# Patient Record
Sex: Female | Born: 1940
Health system: Southern US, Community
[De-identification: ages and names within clinical notes are randomized; demographics above are authoritative.]

## PROBLEM LIST (undated history)

## (undated) DIAGNOSIS — C539 Malignant neoplasm of cervix uteri, unspecified: Secondary | ICD-10-CM

## (undated) DIAGNOSIS — F0390 Unspecified dementia without behavioral disturbance: Secondary | ICD-10-CM

## (undated) DIAGNOSIS — R001 Bradycardia, unspecified: Secondary | ICD-10-CM

## (undated) DIAGNOSIS — I10 Essential (primary) hypertension: Secondary | ICD-10-CM

## (undated) HISTORY — DX: Malignant neoplasm of cervix uteri, unspecified: C53.9

## (undated) HISTORY — DX: Bradycardia, unspecified: R00.1

## (undated) HISTORY — DX: Unspecified dementia, unspecified severity, without behavioral disturbance, psychotic disturbance, mood disturbance, and anxiety: F03.90

## (undated) HISTORY — DX: Essential (primary) hypertension: I10

---

## 2011-03-12 DIAGNOSIS — I10 Essential (primary) hypertension: Secondary | ICD-10-CM

## 2011-03-12 HISTORY — DX: Essential (primary) hypertension: I10

## 2012-03-11 DIAGNOSIS — R001 Bradycardia, unspecified: Secondary | ICD-10-CM

## 2012-03-11 DIAGNOSIS — C539 Malignant neoplasm of cervix uteri, unspecified: Secondary | ICD-10-CM

## 2012-03-11 HISTORY — DX: Bradycardia, unspecified: R00.1

## 2012-03-11 HISTORY — DX: Malignant neoplasm of cervix uteri, unspecified: C53.9

## 2012-05-09 HISTORY — PX: ABDOMINAL HYSTERECTOMY: SHX81

## 2014-01-14 ENCOUNTER — Ambulatory Visit: Payer: Self-pay | Attending: Family Medicine

## 2014-01-28 ENCOUNTER — Encounter: Payer: Self-pay | Admitting: Family Medicine

## 2014-01-28 ENCOUNTER — Ambulatory Visit: Payer: Self-pay | Attending: Family Medicine | Admitting: Family Medicine

## 2014-01-28 VITALS — BP 143/86 | HR 63 | Temp 97.9°F | Resp 16 | Ht 62.0 in | Wt 143.0 lb

## 2014-01-28 DIAGNOSIS — H6092 Unspecified otitis externa, left ear: Secondary | ICD-10-CM | POA: Insufficient documentation

## 2014-01-28 DIAGNOSIS — R413 Other amnesia: Secondary | ICD-10-CM | POA: Insufficient documentation

## 2014-01-28 DIAGNOSIS — R220 Localized swelling, mass and lump, head: Secondary | ICD-10-CM | POA: Insufficient documentation

## 2014-01-28 DIAGNOSIS — E7800 Pure hypercholesterolemia, unspecified: Secondary | ICD-10-CM

## 2014-01-28 DIAGNOSIS — Z90710 Acquired absence of both cervix and uterus: Secondary | ICD-10-CM | POA: Insufficient documentation

## 2014-01-28 DIAGNOSIS — Z8541 Personal history of malignant neoplasm of cervix uteri: Secondary | ICD-10-CM | POA: Insufficient documentation

## 2014-01-28 DIAGNOSIS — R6889 Other general symptoms and signs: Secondary | ICD-10-CM

## 2014-01-28 DIAGNOSIS — I10 Essential (primary) hypertension: Secondary | ICD-10-CM | POA: Insufficient documentation

## 2014-01-28 DIAGNOSIS — F0390 Unspecified dementia without behavioral disturbance: Secondary | ICD-10-CM | POA: Insufficient documentation

## 2014-01-28 MED ORDER — TOBRAMYCIN 0.3 % OP SOLN: OPHTHALMIC | Status: DC

## 2014-01-28 NOTE — Progress Notes (Signed)
   Subjective:    Patient ID: Kara Dennis, female    DOB: 10/24/1940, 73 y.o.   MRN: [id number] CC: establish care, hx of cervical cancer, forgetfulness  HPI 73 yo F from [place] presents with her daughter and [place] interpreter to discuss the following:  1. Cervical cancer: dx in 2014. S/p total hysterectomy done in [place] 05/2012 has some records at home in [place]. No chemo or radiation done. No pelvic pain, fever, chills, weight loss or vaginal bleeding. Requesting gynecology referral. Has a post-op infection following hysterectomy in [place].   2. L posterior jaw nodule: noticed about 2 months ago. Non tender. Daughter concerned.   3. Forgetfulness: deferred to f/u visit.   Soc Hx: non smoker  Med Hx: cervical cancer in 2014, s/p total hysterectomy in [place], no chemo or radiation  Fam Hx: negative for cancer  Review of Systems As per HPI  GAD 7: score of 0  PHQ-9: score of 0    Objective:   Physical Exam BP 143/86 mmHg  Pulse 63  Temp(Src) 97.9 F (36.6 C) (Oral)  Resp 16  Ht 5\' 2"  (1.575 m)  Wt 143 lb (64.864 kg)  BMI 26.15 kg/m2  SpO2 98% General appearance: alert, cooperative and no distress Ears: abnormal external canal right ear - ceruminosis impacting canal and abnormal TM left ear - slightly erythematous and dull, palpable, non tender, anterior auricular lymph node.  Abdomen: soft, non-tender; bowel sounds normal; no masses,  no organomegaly  Genitalia: normal external, no vaginal lesions, shallow vaginal cuff on speculum exam, no mass or tenderness on bimanual exam.      Assessment & Plan:

## 2014-01-28 NOTE — Patient Instructions (Addendum)
Mrs. Lavalle,  Thank you for coming in today. It was a pleasure meeting you. I look forward to being your primary doctor.   1. Cervical caner history: normal exam. I have placed a referral to gynecology.   2. L ear otitis externa: ear drops three times daily for one week.   3. Forgetfulness: you will be called with lab results.   F/u in 3-4 weeks for forgetfulness.   Dr. Adrian Blackwater

## 2014-01-28 NOTE — Progress Notes (Signed)
Pt comes in to establish care for PMH Cervical CA s/p surgery 2014 in [place] referral s/p infection after surgery Denies abdominal pain  HX Dementia- daughter assisting with care [phone]  [place] interpretor present

## 2014-01-28 NOTE — Assessment & Plan Note (Signed)
A: noted one exam, likely cause for enlarge lymph node P: Tobramycin drops for one week

## 2014-01-28 NOTE — Assessment & Plan Note (Signed)
A: w/u deferred to next visit. Labs obtained today: A1c, TSH, B12, lipids, CBC, CMP P: MMSE Neuro exam

## 2014-01-28 NOTE — Assessment & Plan Note (Signed)
A: normal exam.  P: Vaginal pap done today Referral to gyn placed

## 2014-01-29 LAB — CBC
HEMATOCRIT: 43.9 % (ref 36.0–46.0)
HEMOGLOBIN: 15.1 g/dL — AB (ref 12.0–15.0)
MCH: 30.4 pg (ref 26.0–34.0)
MCHC: 34.4 g/dL (ref 30.0–36.0)
MCV: 88.3 fL (ref 78.0–100.0)
MPV: 11.1 fL (ref 9.4–12.4)
PLATELETS: 281 10*3/uL (ref 150–400)
RBC: 4.97 MIL/uL (ref 3.87–5.11)
RDW: 13.1 % (ref 11.5–15.5)
WBC: 6.4 10*3/uL (ref 4.0–10.5)

## 2014-01-29 LAB — COMPLETE METABOLIC PANEL WITH GFR
ALT: 13 U/L (ref 0–35)
AST: 23 U/L (ref 0–37)
Albumin: 4.3 g/dL (ref 3.5–5.2)
Alkaline Phosphatase: 92 U/L (ref 39–117)
BUN: 15 mg/dL (ref 6–23)
CO2: 24 meq/L (ref 19–32)
CREATININE: 0.85 mg/dL (ref 0.50–1.10)
Calcium: 9.9 mg/dL (ref 8.4–10.5)
Chloride: 103 mEq/L (ref 96–112)
GFR, EST AFRICAN AMERICAN: 79 mL/min
GFR, Est Non African American: 68 mL/min
GLUCOSE: 102 mg/dL — AB (ref 70–99)
POTASSIUM: 5.1 meq/L (ref 3.5–5.3)
Sodium: 141 mEq/L (ref 135–145)
Total Bilirubin: 0.7 mg/dL (ref 0.2–1.2)
Total Protein: 7.4 g/dL (ref 6.0–8.3)

## 2014-01-29 LAB — HIV ANTIBODY (ROUTINE TESTING W REFLEX): HIV: NONREACTIVE

## 2014-01-29 LAB — LIPID PANEL
CHOLESTEROL: 207 mg/dL — AB (ref 0–200)
HDL: 39 mg/dL — ABNORMAL LOW (ref >39)
LDL Cholesterol: 118 mg/dL — ABNORMAL HIGH (ref 0–99)
Total CHOL/HDL Ratio: 5.3 Ratio
Triglycerides: 249 mg/dL — ABNORMAL HIGH (ref <150)
VLDL: 50 mg/dL — ABNORMAL HIGH (ref 0–40)

## 2014-01-29 LAB — TSH: TSH: 0.621 u[IU]/mL (ref 0.350–4.500)

## 2014-01-29 LAB — VITAMIN B12: Vitamin B-12: 596 pg/mL (ref 211–911)

## 2014-01-31 DIAGNOSIS — E7800 Pure hypercholesterolemia, unspecified: Secondary | ICD-10-CM | POA: Insufficient documentation

## 2014-01-31 MED ORDER — ATORVASTATIN CALCIUM 10 MG PO TABS: 10.0000 mg | ORAL_TABLET | Freq: Every day | ORAL | Status: DC

## 2014-01-31 NOTE — Addendum Note (Signed)
Addended by: Boykin Nearing on: 01/31/2014 10:37 AM   Modules accepted: Orders

## 2014-01-31 NOTE — Assessment & Plan Note (Signed)
Normal labs except for elevated cholesterol. Recommend lipitor 10 daily.

## 2014-02-04 LAB — CYTOLOGY - PAP

## 2014-02-07 ENCOUNTER — Encounter: Payer: Self-pay | Admitting: Obstetrics & Gynecology

## 2014-02-15 ENCOUNTER — Telehealth: Payer: Self-pay | Admitting: *Deleted

## 2014-02-15 NOTE — Telephone Encounter (Signed)
-----   Message from Minerva Ends, MD sent at 01/31/2014 10:35 AM EST ----- Normal labs except for elevated cholesterol. Recommend lipitor 10 daily.

## 2014-02-15 NOTE — Telephone Encounter (Signed)
Left message to return call  Used [employer] [place] [phone]

## 2014-02-16 ENCOUNTER — Ambulatory Visit: Payer: Self-pay | Attending: Family Medicine | Admitting: Family Medicine

## 2014-02-16 ENCOUNTER — Encounter: Payer: Self-pay | Admitting: Family Medicine

## 2014-02-16 VITALS — BP 144/83 | HR 95 | Temp 98.1°F | Resp 18 | Ht 62.0 in | Wt 146.0 lb

## 2014-02-16 DIAGNOSIS — Z8541 Personal history of malignant neoplasm of cervix uteri: Secondary | ICD-10-CM | POA: Insufficient documentation

## 2014-02-16 DIAGNOSIS — R413 Other amnesia: Secondary | ICD-10-CM | POA: Insufficient documentation

## 2014-02-16 DIAGNOSIS — E7800 Pure hypercholesterolemia, unspecified: Secondary | ICD-10-CM

## 2014-02-16 DIAGNOSIS — R6889 Other general symptoms and signs: Secondary | ICD-10-CM

## 2014-02-16 DIAGNOSIS — H6092 Unspecified otitis externa, left ear: Secondary | ICD-10-CM

## 2014-02-16 MED ORDER — ATORVASTATIN CALCIUM 10 MG PO TABS: 10.0000 mg | ORAL_TABLET | Freq: Every day | ORAL | Status: DC

## 2014-02-16 NOTE — Patient Instructions (Addendum)
Kara Dennis,  Thank you for coming in today. Your memory is doing well with mild impairment no need to medication for memory/dementia. Exercise your memory.   Have a safe and enjoyable [place] new year.  Pick up lipitor to take to [place].   Return in 4 months for reassessment of memory.    Dr. Adrian Blackwater

## 2014-02-16 NOTE — Progress Notes (Signed)
   Subjective:    Patient ID: Kara Dennis, female    DOB: 1940-09-10, 73 y.o.   MRN: [id number] CC: f/u decrease in memory  HPI 73 yo viet[place] female presents with her daughter and interpreter to f/u on intermittent lapses of memory x one year. Symptoms started around the death of her husband and her surgeries x 2 for cervical cancer. No depression, anger, insomnia, wandering.   Soc Hx: non smoker  Review of Systems As per HPI  GAD 7: 0 PHQ 9: 6. documented in flowsheet  MMSE" 25/30 (documented in flowsheet)     Objective:   Physical Exam BP 144/83 mmHg  Pulse 95  Temp(Src) 98.1 F (36.7 C) (Oral)  Resp 18  Ht 5\' 2"  (1.575 m)  Wt 146 lb (66.225 kg)  BMI 26.70 kg/m2  SpO2 97% General appearance: alert, cooperative and no distress Rt ear: no debris. Some white plaques on TM. No erythema.  Neurologic: Grossly normal       Assessment & Plan:

## 2014-02-16 NOTE — Assessment & Plan Note (Signed)
Treated and resolved °

## 2014-02-16 NOTE — Assessment & Plan Note (Signed)
A: intermittent forgetfullness with borderline MMSE.  P: Memory testing exercises Avoid sedatives/mind altering meds Monitor BP and treat if > 150/90 F/u in 4 months for f/u MMSE.

## 2014-02-16 NOTE — Progress Notes (Signed)
F/U Decreased memory

## 2014-03-17 ENCOUNTER — Encounter: Payer: Self-pay | Admitting: Obstetrics & Gynecology

## 2014-03-17 ENCOUNTER — Ambulatory Visit (INDEPENDENT_AMBULATORY_CARE_PROVIDER_SITE_OTHER): Payer: Self-pay | Admitting: Obstetrics & Gynecology

## 2014-03-17 VITALS — BP 140/73 | HR 92 | Temp 97.9°F | Wt 145.3 lb

## 2014-03-17 DIAGNOSIS — Z8541 Personal history of malignant neoplasm of cervix uteri: Secondary | ICD-10-CM

## 2014-03-17 NOTE — Patient Instructions (Signed)
Pap Test A Pap test is a procedure done in a clinic office to evaluate cells that are on the surface of the cervix. The cervix is the lower portion of the uterus and upper portion of the vagina. For some women, the cervical region has the potential to form cancer. With consistent evaluations by your caregiver, this type of cancer can be prevented.  If a Pap test is abnormal, it is most often a result of a previous exposure to human papillomavirus (HPV). HPV is a virus that can infect the cells of the cervix and cause dysplasia. Dysplasia is where the cells no longer look normal. If a woman has been diagnosed with high-grade or severe dysplasia, they are at higher risk of developing cervical cancer. People diagnosed with low-grade dysplasia should still be seen by their caregiver because there is a small chance that low-grade dysplasia could develop into cancer.  LET YOUR CAREGIVER KNOW ABOUT:  Recent sexually transmitted infection (STI) you have had.  Any new sex partners you have had.  History of previous abnormal Pap tests results.  History of previous cervical procedures you have had (colposcopy, biopsy, loop electrosurgical excision procedure [LEEP]).  Concerns you have had regarding unusual vaginal discharge.  History of pelvic pain.  Your use of birth control. BEFORE THE PROCEDURE  Ask your caregiver when to schedule your Pap test. It is best not to be on your period if your caregiver uses a wooden spatula to collect cells or applies cells to a glass slide. Newer techniques are not so sensitive to the timing of a menstrual cycle.  Do not douche or have sexual intercourse for 24 hours before the test.   Do not use vaginal creams or tampons for 24 hours before the test.   Empty your bladder just before the test to lessen any discomfort.  PROCEDURE You will lie on an exam table with your feet in stirrups. A warm metal or plastic instrument (speculum) is placed in your vagina. This  instrument allows your caregiver to see the inside of your vagina and look at your cervix. A small, plastic brush or wooden spatula is then used to collect cervical cells. These cells are placed in a lab specimen container. The cells are looked at under a microscope. A specialist will determine if the cells are normal.  AFTER THE PROCEDURE Make sure to get your test results.If your results come back abnormal, you may need further testing.  Document Released: 05/18/2002 Document Revised: 05/20/2011 Document Reviewed: 02/21/2011 ExitCare Patient Information 2015 [employer]. This information is not intended to replace advice given to you by your health care provider. Make sure you discuss any questions you have with your health care provider.  

## 2014-03-17 NOTE — Progress Notes (Signed)
Subjective:     Patient ID: Kara Dennis, female   DOB: 14-Dec-1940, 74 y.o.   MRN: [id number]  HPI Pt is here with her daughter.  She reports that she has cervical cancer in [place] in 05/2012.  She reports that she did not have radiation or chemo but that is was cured with surgery.  When questioned about how it was diagnosed the daughter reports that it was due to bleeding. She denies that it could have been uterine and feel sure that she was told cervical.  She reports that she has not had bleeding since that time. She denies pain or weight loss. She denies other constitutional sx.  Past Medical History  Diagnosis Date  . Dementia   . HTN (hypertension) 2013    took some medicine in [place]   . Cervical cancer 2014     s/p hysterectomy, no chemo or radiation   . Bradycardia 2014     identified before hysterectomy, treated for short time with medication before during and after hysterectomy    Past Surgical History  Procedure Laterality Date  . Abdominal hysterectomy  05/2012        Review of Systems     Objective:   Physical Exam BP 140/73 mmHg  Pulse 92  Temp(Src) 97.9 F (36.6 C)  Wt 145 lb 4.8 oz (65.908 kg) Abd: well healed vertical incision; obese abd GU: EGBUS: no lesions Vagina: no blood in vault; well healed Cervix/Uterus: surgically absent Adnexa: no masses; non tender       Assessment:     H/o by pt report of cervical cancer.  Records not available.  Normal exam        Plan:     F/u yearly for pelvic exam

## 2014-03-17 NOTE — Progress Notes (Signed)
Language resource present with patient for the encounter

## 2014-05-03 NOTE — Telephone Encounter (Signed)
Open in error

## 2014-11-23 ENCOUNTER — Encounter: Payer: Self-pay | Admitting: Obstetrics & Gynecology

## 2016-04-17 ENCOUNTER — Ambulatory Visit (HOSPITAL_COMMUNITY)
Admission: RE | Admit: 2016-04-17 | Discharge: 2016-04-17 | Disposition: A | Discharge status: Home / Self Care | Payer: Self-pay | Source: Ambulatory Visit | Attending: Emergency Medicine | Admitting: Emergency Medicine

## 2016-04-17 ENCOUNTER — Ambulatory Visit (INDEPENDENT_AMBULATORY_CARE_PROVIDER_SITE_OTHER): Payer: Self-pay | Admitting: Emergency Medicine

## 2016-04-17 VITALS — BP 128/82 | HR 119 | Temp 98.6°F | Resp 18 | Ht 62.0 in | Wt 145.0 lb

## 2016-04-17 DIAGNOSIS — R221 Localized swelling, mass and lump, neck: Secondary | ICD-10-CM | POA: Insufficient documentation

## 2016-04-17 DIAGNOSIS — Z23 Encounter for immunization: Secondary | ICD-10-CM

## 2016-04-17 DIAGNOSIS — R220 Localized swelling, mass and lump, head: Secondary | ICD-10-CM

## 2016-04-17 DIAGNOSIS — L03221 Cellulitis of neck: Secondary | ICD-10-CM

## 2016-04-17 MED ORDER — AMOXICILLIN-POT CLAVULANATE 875-125 MG PO TABS: 1.0000 | ORAL_TABLET | Freq: Two times a day (BID) | ORAL | 0 refills | Status: DC

## 2016-04-17 MED FILL — AMOX-CLAV 875-125 MG TABLET: 875-125 | 10 days supply | Qty: 20 | Fill #0

## 2016-04-17 NOTE — Patient Instructions (Addendum)
  Scheduled for neck ultrasound today 11 am at [facility].   IF you received an x-ray today, you will receive an invoice from [facility]. Please contact [facility] at [phone] with questions or concerns regarding your invoice.   IF you received labwork today, you will receive an invoice from [facility]. Please contact [facility] at [phone] with questions or concerns regarding your invoice.   Our billing staff will not be able to assist you with questions regarding bills from these companies.  You will be contacted with the lab results as soon as they are available. The fastest way to get your results is to activate your My Chart account. Instructions are located on the last page of this paperwork. If you have not heard from [place] regarding the results in 2 weeks, please contact this office.

## 2016-04-17 NOTE — Progress Notes (Signed)
Kara Dennis 76 y.o.   Chief Complaint  Patient presents with  . Facial Swelling    under L ear very big and painful x 2days more than usual.    HISTORY OF PRESENT ILLNESS: This is a 76 y.o. female complaining of large hard mass to left side of neck x weeks. Hx provided by daughter.  HPI   Prior to Admission medications   Medication Sig Start Date End Date Taking? Authorizing Provider  amoxicillin-clavulanate (AUGMENTIN) 875-125 MG tablet Take 1 tablet by mouth 2 (two) times daily. 04/17/16   Horald Pollen, MD  atorvastatin (LIPITOR) 10 MG tablet Take 1 tablet (10 mg total) by mouth daily. Patient not taking: Reported on 04/17/2016 02/16/14   Boykin Nearing, MD    No Known Allergies  Patient Active Problem List   Diagnosis Date Noted  . High cholesterol 01/31/2014  . History of cervical cancer 01/28/2014  . Forgetfulness 01/28/2014  . HTN (hypertension)     Past Medical History:  Diagnosis Date  . Bradycardia 2014    identified before hysterectomy, treated for short time with medication before during and after hysterectomy   . Cervical cancer ([place]) 2014    s/p hysterectomy, no chemo or radiation   . Dementia   . HTN (hypertension) 2013   took some medicine in [place]     Past Surgical History:  Procedure Laterality Date  . ABDOMINAL HYSTERECTOMY  05/2012     Social History   Social History  . Marital status: Widowed    Spouse name: N/A  . Number of children: 6   . Years of education: 3   Occupational History  . Homemaker     Social History Main Topics  . Smoking status: Never Smoker  . Smokeless tobacco: Never Used  . Alcohol use No  . Drug use: No  . Sexual activity: No   Other Topics Concern  . Not on file   Social History Narrative   Lives with daughter, [place], [place].    Moved from [place] in 05/2013.   Has 6 children total.   4 in [place]   1 in [place]   1 in [place]       Has many grandchildren. 17 total.           Family  History  Problem Relation Age of Onset  . Diabetes Brother   . Heart disease Neg Hx   . Cancer Neg Hx      Review of Systems  Constitutional: Negative for chills, fever and weight loss.  HENT: Negative for congestion, nosebleeds and sore throat.   Eyes: Negative for blurred vision, discharge and redness.  Respiratory: Negative for cough, hemoptysis, shortness of breath and wheezing.   Cardiovascular: Negative for chest pain, palpitations and leg swelling.  Gastrointestinal: Negative for abdominal pain, diarrhea, nausea and vomiting.  Genitourinary: Negative for dysuria and hematuria.  Musculoskeletal: Positive for neck pain. Negative for back pain.  Skin: Negative for rash.  Neurological: Negative for dizziness and headaches.  All other systems reviewed and are negative.  Vitals:   04/17/16 0853  BP: 128/82  Pulse: (!) 119  Resp: 18  Temp: 98.6 F (37 C)     Physical Exam  Constitutional: She is oriented to person, place, and time. She appears well-developed and well-nourished.  HENT:  Head: Normocephalic and atraumatic.  Nose: Nose normal.  Mouth/Throat: Oropharynx is clear and moist.  Eyes: Conjunctivae and EOM are normal. Pupils are equal, round, and reactive to  light.  Neck: No JVD present.  Large hard slightly tender mass to left infraauricular submandibular area; no fluctuation but slight erythema of overlying skin. Suspected neoplasia.  Cardiovascular: Normal rate, regular rhythm and normal heart sounds.   Pulmonary/Chest: Effort normal and breath sounds normal.  Musculoskeletal: Normal range of motion.  Neurological: She is alert and oriented to person, place, and time. No sensory deficit. She exhibits normal muscle tone.  Skin: Skin is warm and dry. Capillary refill takes less than 2 seconds.  Psychiatric: She has a normal mood and affect. Her behavior is normal.  Vitals reviewed.  Vitals:   04/17/16 0853  BP: 128/82  Pulse: (!) 119  Resp: 18  Temp: 98.6  F (37 C)     ASSESSMENT & PLAN: Casandra was seen today for facial swelling.  Diagnoses and all orders for this visit:  Mass of left submandibular region -     Cancel: US Soft Tissue Head/Neck; Future -     Ambulatory referral to ENT  Need for prophylactic vaccination and inoculation against influenza -     Flu Vaccine QUAD 36+ mos IM  Neck mass -     US Soft Tissue Head/Neck; Future  Cellulitis of neck  Other orders -     amoxicillin-clavulanate (AUGMENTIN) 875-125 MG tablet; Take 1 tablet by mouth 2 (two) times daily.  Scheduled for neck ultrasound today 11am at [facility]

## 2016-04-17 NOTE — Addendum Note (Signed)
Addended by: Davina Poke on: 04/17/2016 03:04 PM   Modules accepted: Orders

## 2016-04-18 ENCOUNTER — Telehealth: Payer: Self-pay | Admitting: Emergency Medicine

## 2016-04-18 NOTE — Telephone Encounter (Signed)
Spoke to daughter and made her aware of ultrasound results and need to f/u with ENT surgeon.

## 2016-04-26 ENCOUNTER — Ambulatory Visit: Payer: Self-pay | Attending: Family Medicine

## 2016-04-30 ENCOUNTER — Telehealth: Payer: Self-pay | Admitting: Physician Assistant

## 2016-04-30 NOTE — Telephone Encounter (Signed)
Can you please locate the ent referral and when she is to be seen.  She was supposed to be seen.  ent referral placed 04/17/2016.

## 2016-05-01 NOTE — Telephone Encounter (Signed)
Looks like she set up an 8:45am apt with Dr. Lucia Gaskins for 04/24/16

## 2016-05-03 ENCOUNTER — Ambulatory Visit: Payer: Self-pay | Attending: Family Medicine

## 2016-05-13 NOTE — Progress Notes (Signed)
This pt was referred to Dr Lucia Gaskins and was seen at his office on 05/06/16.

## 2016-05-17 NOTE — Progress Notes (Signed)
Referral was done on 04/17/16.  Patient has been seen and note is in the chart.

## 2016-05-22 ENCOUNTER — Ambulatory Visit (INDEPENDENT_AMBULATORY_CARE_PROVIDER_SITE_OTHER): Payer: Self-pay | Admitting: Family Medicine

## 2016-05-22 VITALS — BP 126/80 | HR 101 | Temp 97.9°F | Resp 18 | Ht 62.0 in | Wt 144.0 lb

## 2016-05-22 DIAGNOSIS — Z136 Encounter for screening for cardiovascular disorders: Secondary | ICD-10-CM

## 2016-05-22 DIAGNOSIS — Z1329 Encounter for screening for other suspected endocrine disorder: Secondary | ICD-10-CM

## 2016-05-22 DIAGNOSIS — Z01419 Encounter for gynecological examination (general) (routine) without abnormal findings: Secondary | ICD-10-CM

## 2016-05-22 DIAGNOSIS — R350 Frequency of micturition: Secondary | ICD-10-CM

## 2016-05-22 DIAGNOSIS — B078 Other viral warts: Secondary | ICD-10-CM

## 2016-05-22 DIAGNOSIS — Z1239 Encounter for other screening for malignant neoplasm of breast: Secondary | ICD-10-CM

## 2016-05-22 DIAGNOSIS — Z1231 Encounter for screening mammogram for malignant neoplasm of breast: Secondary | ICD-10-CM

## 2016-05-22 DIAGNOSIS — Z130 Encounter for screening for diseases of the blood and blood-forming organs and certain disorders involving the immune mechanism: Secondary | ICD-10-CM

## 2016-05-22 DIAGNOSIS — R413 Other amnesia: Secondary | ICD-10-CM

## 2016-05-22 DIAGNOSIS — Z131 Encounter for screening for diabetes mellitus: Secondary | ICD-10-CM

## 2016-05-22 LAB — POCT URINALYSIS DIP (MANUAL ENTRY)
BILIRUBIN UA: NEGATIVE
GLUCOSE UA: NEGATIVE
Ketones, POC UA: NEGATIVE
Nitrite, UA: NEGATIVE
Protein Ur, POC: NEGATIVE
RBC UA: NEGATIVE
SPEC GRAV UA: 1.02
Urobilinogen, UA: 0.2
pH, UA: 5.5

## 2016-05-22 LAB — POCT WET + KOH PREP
TRICH BY WET PREP: ABSENT
Yeast by KOH: ABSENT
Yeast by wet prep: ABSENT

## 2016-05-22 LAB — POC MICROSCOPIC URINALYSIS (UMFC): Mucus: ABSENT

## 2016-05-22 MED ORDER — ATORVASTATIN CALCIUM 10 MG PO TABS: 10.0000 mg | ORAL_TABLET | Freq: Every day | ORAL | 3 refills | Status: DC

## 2016-05-22 NOTE — Patient Instructions (Addendum)
Forgetfulness I am referring you to see neurologist for a comprehensive workup to evaluate cause of worsening memory  High Cholesterol I am reordering your Lipitor to treat your high cholesterol.  Also please start taking 81 mg daily aspirin.  You will be notified of your labs results.  Mammogram I have ordered a breast mammogram. Please contact the breast center to schedule a time for screening.    We recommend that you schedule a mammogram for breast cancer screening. Typically, you do not need a referral to do this. Please contact a local imaging center to schedule your mammogram.  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   IF you received an x-ray today, you will receive an invoice from [facility]. Please contact [facility] at [phone] with questions or concerns regarding your invoice.   IF you received labwork today, you will receive an invoice from [facility]. Please contact [facility] at [phone] with questions or concerns regarding your invoice.   Our billing staff will not be able to assist you with questions regarding bills from these companies.  You will be contacted with the lab results as soon as they are available. The fastest way to get your results is to activate your My Chart account. Instructions are located on the last page of this paperwork. If you have not heard from [place] regarding the  results in 2 weeks, please contact this office.    Ch? ?? ?n h?n ch? ch?t be?o va? cholesterol Fat and Cholesterol Restricted Diet N?ng ?? ch?t bo v cholesterol cao trong mu cu?a quy? vi? c th? d?n ??n cc v?n ?? s?c kh?e khc nhau, ch?ng h?n nh? cc b?nh v? tim, m?ch mu, ti m?t, gan v tuy?n t?y. Ch?t bo l ca?c ngu?n n?ng l??ng t?p trung t?n t?i ? nhi?u d?ng khc nhau. M?t s? lo?i ch?t bo nh?t ??nh, bao g?m ch?t bo bo ha, c th? gy h?i khi th??a. Cholesterol l m?t ch?t m c? th? c?n ??n v?i m?t l??ng nh?. C? th? c?a quy? vi? ta?o ra t?t c? cholesterol c?n thi?t. Cholesterol d? th?a do th?c ph?m quy? vi? ?n. Khi quy? vi? c n?ng ?? cholesterol v ch?t bo bo ha cao trong mu, cc v?n ?? s?c kh?e c th? pht sinh v ch?t bo v cholesterol d? th??a s? ti?ch tu? d?c theo tha?nh cc m?ch mu, khi?n cc m?ch mu ? bi? he?p la?i. L?a ch?n ?ng lo?i th?c ph?m s? gip quy? vi? ki?m sot l??ng ch?t bo v cholesterol ?n va?o. ?i?u ny s? gip gi? cho n?ng ?? cc ch?t na?y trong mu c?a quy? vi? n?m trong gi?i h?n bnh th??ng v lm gi?m nguy c? m?c b?nh. K? ho?ch c?a ti l g? Chuyn gia ch?m [place] s?c kh?e c th? khuy?n ngh? quy? vi?:  H?n ch? l??ng ch?t bo tiu th? ?? m??c t? ______% tr? xu?ng theo t?ng l??ng calo m?i ngy.  H?n ch? l??ng cholesterol trong ch? ?? ?n c?a quy?  vi? ?? m??c d??i _________mg m?i ngy.  ?n t? 20-30 gam ch?t x? m?i ngy. Ti nn ch?n nh?ng lo?i ch?t bo no?  Ch?n cc ch?t bo t?t cho s?c kh?e th??ng xuyn h?n. Ch?n ch?t bo khng bo ha ??n v ch?t be?o khng ba?o ho?a ?a, ch?ng h?n nh? d?u  liu v d?u canola, h?t lanh, qu? c ch, h?nh nhn v cc lo?i h?t.  ?n thm ch?t bo omega-3. Nh??ng l?a ch?n h??p ly? bao g?m c h?i, c thu, c mi, c ng?, d?u h?t lanh va? h?t lanh nghi?n. ???t mu?c tiu ?n c t nh?t hai l?n m?t tu?n.  H?n ch? cc ch?t bo bo ha. Ch?t bo bo ha ch? y?u ???c tm th?y trong cc s?n ph?m ??ng v?t, nh? th?t, b? v kem. Ca?c  ngu?n ch?t bo bo ha t?? th??c v?t bao g?m d?u c?, d?u h?t c? v d?u d?a.  Hessie Diener cc th?c ph?m co? ch??a cc lo?i d?u hydro ha m?t ph?n. Nh?ng th?c ph?m ny ch?a ch?t bo chuy?n ha. V d? v? th?c ph?m ch?a ch?t bo chuy?n ho?a l b? th?c v?t, m?t s? b? th?c v?t ?o?ng h?p, bnh quy, bnh quy gin v bnh n??ng khc. Nh?ng nguyn t?c chung ti c?n tun theo l g? Nh?ng h??ng d?n cho vi?c ?n u?ng lnh m?nh na?y s? gip quy? vi? ki?m sot l??ng ch?t be?o v cholesterol ?n va?o:  Ki?m tra nhn th?c ph?m c?n th?n ?? nh?n bi?t th?c ph?m c ch?a ch?t bo chuy?n ha ho?c c hm l??ng ch?t bo bo ha cao.  Cho rau v sa lt rau xanh vo m?t n?a ??a ??ng th?c ?n.  Cho ngu? c?c nguyn ca?m va?o m?t ph?n t? ??a. Hy tm t?? "whole" (nguyn ca?m) l t? ??u tin trong danh sch thnh ph?n th?c ?n.  Cho th?c ?n c protein thi?t na?c va?o m?t ph?n t? ??a.  H?n ch? tri cy ?? m??c hai b?a m?t ngy. Ch?n tri cy thay v n??c tri cy.  ?n nhi?u th?c ph?m c ch?a ch?t x? nh? to, bng c?i xanh, c r?t, ??u, ??u H-Lan v ??i m?ch.  ?n nhi?u th?c ?n n?u t?i nh v i?t th??c ?n ?? nh hng, th??c ?n t? ch?n v th?c ?n nhanh.  H?n ch? ho?c trnh u?ng r??u.  Ha?n ch? th?c ph?m co? nhi?u tinh b?t v ???ng.  H?n ch? th?c ph?m chin.  N?u ?n b?ng cc ph??ng php khc thay vi? chin. Bo? lo?, lu?c, n??ng v hun ??u l nh??ng l?a ch?n tuy?t v?i.  Gi?m cn n?u qu v? th?a cn. Gia?m ch? 5-10% cn n?ng c? th? ban ??u c th? gip i?ch cho s?c kh?e t?ng th? c?a quy? vi? v ng?n ng?a cc b?nh nh? ti?u ???ng v b?nh tim. Ti c th? ?n nh?ng th?c ph?m no? Ng? c?c nguyn h?t   Ng? c?c nguyn ca?m nh? la m nguyn ca?m ho??c ho?c bnh m nguyn ca?m, bnh quy gin, ng? c?c v m ?ng. B?t y?n m?ch khng ???ng, mi? bulgur, la m?ch, dim ma?ch (quinoa) ho?c g?o l??t. Ng ho?c bnh b?t m nguyn ca?m. Marlou Starks c?   Marlou Starks c? t??i ho?c ?ng l?nh (ch?a ch? bi?n, h?p, bo? lo? ho?c n??ng). Toney Reil tr?n. Tri cy    T?t c? cc lo?i tra?i cy t??i, ?ng h?p (d???i da?ng n??c p t? nhin) ho?c ?ng l?nh. Th?t v nh?ng ngu?n th?c ph?m ch?a protein khc   Th?t b xay (85% ho?c na?c h?n),  thi?t bo? ?n c? ho?c th?t b c?t bo? m??. Ga? ho??c ga? ty bo? da. G ho?c g ty xay. Th?t l??n c??t bo? m??. T?t c? c v h?i s?n. Tr?ng. Cc lo?i ??u, ??u H Lan ho??c ??u l?ng s?y kh. Cc lo?i qu? h?ch v h?t khng ??p mu?i. ??u ?ng h?p ho??c ??u s?y kh khng ??p mu?i. B? s?a   Cc s?n ph?m t? s?a t bo nh? s?a g?y ho?c s?a 1%, 2% ho?c pho mt t bo, ricotta i?t bo ho?c pho mt t??i ho?c s?a chua t bo. M? v d?u   B? th?c v?t ?ng h?p khng c ch?t bo chuy?n ha. Mayonnaise v n???c tr?n rau tr?n nh? ho??c i?t ch?t be?o. Qu? b?. D?u  liu, d?u canola, d?u m ho??c d?u hoa rum. B? ??u ph?ng ho?c h?nh nhn thin nhin (ch?n nh?ng loa?i khng co? thm ???ng v d?u). Nh?ng th?c ph?m li?t k ? trn c th? khng ph?i l m?t danh m?c ??y ?? cc th?c ph?m v ?? u?ng ???c khuy?n ngh?Augustin Coupe h? v?i chuyn gia dinh d??ng ?? c thm s? l?a ch?n.  Cc th?c ph?m c?n trnh Ng? c?c nguyn h?t   Bnh m tr?ng. M tr?ng. G?o tr?ng. Bnh m ng. Bnh m trn, bnh ng?t v bnh s?ng b. Bnh quy gin ch?a ch?t bo chuy?n ha. Marlou Starks c?   Khoai ty tr?ng. Howie Ill tr?n kem ho??c rau xa?o. Cc lo?i rau tr?n n??c s?t pho mt. Tri cy   Tri cy s?y kh. Tri cy ?ng h?p ngm xi-r loa?ng ho??c ???c. N??c p tri cy. Th?t v nh?ng ngu?n th?c ph?m ch?a protein khc   La?t thi?t m??. X??ng s??n, cnh g, th?t xng khi, xc xch, xc xch hun khi, xc xch Y?, lo?ng l??n, m? l?ng l??n, xc xch hot dog, xc xi?ch ?? ra?n v th?t ?n tr?a ?ng gi. Gan v thi?t n?i ta?ng. B? s?a   S??a nguyn kem ho??c 2%, kem, h?n h??p s??a nguyn kem va? kem t??i v pho mt kem. Pho mt s?a nguyn kem. S??a chua nguyn ch?t bo ho?c s?a chua co? ????ng. Pho mt nguyn ch?t bo. B?t kem khng s??a v l??p phu? kem ?a? ?a?nh bng. Pho mt  ch? bi?n, ph?t pho mt, ho??c s??a ?ng pho mt. ?? u?ng   R??u. ?? u?ng c ???ng (nh? soda, n??c chanh v n??c tri cy ho?c r???u pn). M? v d?u   B?, b? th?c v?t da?ng tho?i, m? l?n, m?? pha ba?nh, b? s?a tru l?ng ho?c ch?t bo th?t xng khi. D?u d?a, h?t c?, ho?c c?. K?o v ?? trng mi?ng   Xi-r ng, ???ng, m?t ong v m?t ????ng. K?o. M??t v th?ch. Xi r. Ngu? c?c co? ????ng. Ba?nh quy, bnh n??ng, bnh ng?t, bnh rn, bnh n??ng x?p v kem. Nh?ng th?c ph?m li?t k ? trn c th? khng ph?i l m?t danh m?c ??y ?? cc th?c ph?m v ?? u?ng c?n trnh. Lin h? v?i chuyn gia dinh d??ng ?? c thm thng tin.  Thng tin ny khng nh?m m?c ?ch thay th? cho l?i khuyn m chuyn gia ch?m [place] s?c kh?e ni v?i qu v?. Hy b?o ??m qu v? ph?i th?o lu?n b?t k? v?n ?? g m qu v? c v?i chuyn gia ch?m [place] s?c kh?e c?a qu v?. Document Released: 06/19/2015 Document Revised: 03/15/2016 Document Reviewed: 05/26/2013 Elsevier Interactive Patient Education  2017 [employer].

## 2016-05-22 NOTE — Progress Notes (Signed)
Patient ID: Kara Dennis, female    DOB: [birth year], 76 y.o.   MRN: [id number]  PCP: Minerva Ends, MD  Chief Complaint  Patient presents with  . Follow-up    Patient had cervical cancer 3 years ago/ patient wants a check up to make sure she is ok    Subjective:  HPI-Daughter is Interpreting  67, 76 year old female, presents for evaluation of multiple chronic problems including hx of cervical cancer with a complete hysterectomy (this dx and procedure occurred in [place]), hypertension, elevated cholesterol, and forgetfulness.  Last OB/GYN visit occurred in January 2016 which resulted in a normal pelvic exam. Reports good appetite. No recent falls. Resides with daughter.  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 These have been present for several years. Non itching, non painful, have never resolved, only worsened.  Swollen Neck Lymph Node  Hx of swollen lymph node behind left ear. Previously treated for bacterial infection with antibiotic here at at [place] at [facility] and referred to ENT for ultrasound. Daughter reports ultrasound has been completed and ENT recommended a follow CT scan. Pt reports significant improvement related to swelling and is presently non-tender/non-painful. They were previously seen by ENT.  Gynecological exam No vaginal bleeding. Denies abdominal pain. Daughter reports increased urination, although daughter feels increased urination is associated with increase fluid intake.No recent wellness visit.   Social History   Social History  . Marital status: Widowed    Spouse name: N/A  . Number of children: 6   . Years of education: 3   Occupational History  . Homemaker     Social History Main Topics  . Smoking status: Never Smoker  . Smokeless tobacco: Never Used  . Alcohol use No  . Drug use: No  . Sexual activity: No   Other Topics Concern  . Not on file   Social History Narrative   Lives with daughter,  [place], [place].    Moved from [place] in 05/2013.   Has 6 children total.   4 in [place]   1 in [place]   1 in [place]       Has many grandchildren. 17 total.           Family History  Problem Relation Age of Onset  . Diabetes Brother   . Heart disease Neg Hx   . Cancer Neg Hx    Review of Systems HPI Patient Active Problem List   Diagnosis Date Noted  . Neck mass 04/17/2016  . High cholesterol 01/31/2014  . History of cervical cancer 01/28/2014  . Forgetfulness 01/28/2014  . HTN (hypertension)     No Known Allergies  Prior to Admission medications   Medication Sig Start Date End Date Taking? Authorizing Provider  atorvastatin (LIPITOR) 10 MG tablet Take 1 tablet (10 mg total) by mouth daily. Patient not taking: Reported on 04/17/2016 02/16/14   Boykin Nearing, MD  Past Medical, Surgical Family and Social History reviewed and updated.  Objective:   Today's Vitals   05/22/16 0810  BP: 126/80  Pulse: (!) 101  Resp: 18  Temp: 97.9 F (36.6 C)  TempSrc: Oral  SpO2: 96%  Weight: 144 lb (65.3 kg)  Height: '5\' 2"'$  (1.575 m)    Wt Readings from Last 3 Encounters:  05/22/16 144 lb (65.3 kg)  04/17/16 145 lb (65.8 kg)  03/17/14 145 lb 4.8 oz (65.9 kg)   Physical Exam  Abdominal: She exhibits no distension and no mass.  There is no tenderness. There is no rebound and no guarding.  Genitourinary: Vagina normal. Pelvic exam was performed with patient supine. There is no rash, tenderness, lesion or injury on the right labia. There is no rash, tenderness, lesion or injury on the left labia. Cervix exhibits no motion tenderness, no discharge and no friability. No tenderness or bleeding in the vagina. No vaginal discharge found.  Genitourinary Comments: No palpable masses or adnexa  Hx of complete hysterectomy   Neurological: She is alert. She displays no tremor. She displays a negative Romberg sign. Gait normal.  Follows commands. Difficult to measure verbal quality as patient  is non-english speaking Negative of extremities tremors or tongue tremors. Intact gait.   Skin: Skin is warm and dry.  Psychiatric: She has a normal mood and affect. Her behavior is normal. Judgment and thought content normal.      Assessment & Plan:  1. Screening for diabetes mellitus - CMP14+EGFR - Hemoglobin A1c  2. Visit for pelvic exam -Unremarkable. Repeat 12 months.  3. Screening for thyroid disorder - Thyroid Panel With TSH  4. Screening for deficiency anemia - CBC with Differential  5. Screening for cardiovascular condition - Lipid panel  6. Screening for breast cancer - MM DIGITAL SCREENING BILATERAL;This will be patients initial screening for breast cancer. No prior mammograms.   7. Frequency of urination - UA unremarkable. Culture pending.  8. Other viral warts - Ambulatory referral to Dermatology  10. Memory loss - Ambulatory referral to Neurology  Carroll Sage. Kenton Kingfisher, MSN, FNP-C [facility] at [place]

## 2016-05-23 ENCOUNTER — Other Ambulatory Visit: Payer: Self-pay | Admitting: Emergency Medicine

## 2016-05-23 DIAGNOSIS — E7800 Pure hypercholesterolemia, unspecified: Secondary | ICD-10-CM

## 2016-05-23 LAB — LIPID PANEL
CHOLESTEROL TOTAL: 211 mg/dL — AB (ref 100–199)
Chol/HDL Ratio: 4.7 ratio units — ABNORMAL HIGH (ref 0.0–4.4)
HDL: 45 mg/dL (ref >39)
LDL Calculated: 124 mg/dL — ABNORMAL HIGH (ref 0–99)
TRIGLYCERIDES: 212 mg/dL — AB (ref 0–149)
VLDL Cholesterol Cal: 42 mg/dL — ABNORMAL HIGH (ref 5–40)

## 2016-05-23 LAB — CMP14+EGFR
A/G RATIO: 1.2 (ref 1.2–2.2)
ALT: 29 IU/L (ref 0–32)
AST: 52 IU/L — AB (ref 0–40)
Albumin: 4.4 g/dL (ref 3.5–4.8)
Alkaline Phosphatase: 135 IU/L — ABNORMAL HIGH (ref 39–117)
BUN/Creatinine Ratio: 14 (ref 12–28)
BUN: 13 mg/dL (ref 8–27)
Bilirubin Total: 0.6 mg/dL (ref 0.0–1.2)
CO2: 21 mmol/L (ref 18–29)
Calcium: 10 mg/dL (ref 8.7–10.3)
Chloride: 103 mmol/L (ref 96–106)
Creatinine, Ser: 0.95 mg/dL (ref 0.57–1.00)
GFR calc non Af Amer: 59 mL/min/{1.73_m2} — ABNORMAL LOW (ref >59)
GFR, EST AFRICAN AMERICAN: 68 mL/min/{1.73_m2} (ref >59)
Globulin, Total: 3.6 g/dL (ref 1.5–4.5)
Glucose: 114 mg/dL — ABNORMAL HIGH (ref 65–99)
POTASSIUM: 4 mmol/L (ref 3.5–5.2)
SODIUM: 144 mmol/L (ref 134–144)
TOTAL PROTEIN: 8 g/dL (ref 6.0–8.5)

## 2016-05-23 LAB — CBC WITH DIFFERENTIAL/PLATELET
Basophils Absolute: 0 10*3/uL (ref 0.0–0.2)
Basos: 0 %
EOS (ABSOLUTE): 0.5 10*3/uL — ABNORMAL HIGH (ref 0.0–0.4)
Eos: 8 %
HEMATOCRIT: 43.9 % (ref 34.0–46.6)
HEMOGLOBIN: 15 g/dL (ref 11.1–15.9)
IMMATURE GRANS (ABS): 0 10*3/uL (ref 0.0–0.1)
Immature Granulocytes: 0 %
LYMPHS: 33 %
Lymphocytes Absolute: 1.8 10*3/uL (ref 0.7–3.1)
MCH: 31 pg (ref 26.6–33.0)
MCHC: 34.2 g/dL (ref 31.5–35.7)
MCV: 91 fL (ref 79–97)
MONOCYTES: 7 %
Monocytes Absolute: 0.4 10*3/uL (ref 0.1–0.9)
Neutrophils Absolute: 2.8 10*3/uL (ref 1.4–7.0)
Neutrophils: 52 %
Platelets: 267 10*3/uL (ref 150–379)
RBC: 4.84 x10E6/uL (ref 3.77–5.28)
RDW: 13.9 % (ref 12.3–15.4)
WBC: 5.5 10*3/uL (ref 3.4–10.8)

## 2016-05-23 LAB — THYROID PANEL WITH TSH
Free Thyroxine Index: 2.2 (ref 1.2–4.9)
T3 Uptake Ratio: 23 % — ABNORMAL LOW (ref 24–39)
T4, Total: 9.7 ug/dL (ref 4.5–12.0)
TSH: 1.66 u[IU]/mL (ref 0.450–4.500)

## 2016-05-23 MED ORDER — ATORVASTATIN CALCIUM 10 MG PO TABS: 10.0000 mg | ORAL_TABLET | Freq: Every day | ORAL | 3 refills | Status: DC

## 2016-05-23 MED FILL — ATORVASTATIN 10 MG TABLET: 10 | 30 days supply | Qty: 30 | Fill #0

## 2016-05-24 LAB — URINE CULTURE: ORGANISM ID, BACTERIA: NO GROWTH

## 2016-05-30 ENCOUNTER — Ambulatory Visit (INDEPENDENT_AMBULATORY_CARE_PROVIDER_SITE_OTHER): Payer: Self-pay | Admitting: Neurology

## 2016-05-30 ENCOUNTER — Other Ambulatory Visit: Payer: Self-pay

## 2016-05-30 ENCOUNTER — Encounter: Payer: Self-pay | Admitting: Neurology

## 2016-05-30 VITALS — BP 144/88 | HR 83 | Ht 62.0 in | Wt 145.0 lb

## 2016-05-30 DIAGNOSIS — F0390 Unspecified dementia without behavioral disturbance: Secondary | ICD-10-CM

## 2016-05-30 DIAGNOSIS — R413 Other amnesia: Secondary | ICD-10-CM

## 2016-05-30 MED ORDER — DONEPEZIL HCL 10 MG PO TABS: 10.0000 mg | ORAL_TABLET | Freq: Every day | ORAL | 11 refills | Status: DC

## 2016-05-30 MED ORDER — ALPRAZOLAM 0.5 MG PO TABS: ORAL_TABLET | ORAL | 0 refills | Status: DC

## 2016-05-30 MED FILL — DONEPEZIL HCL 10 MG TABLET: 10 | 30 days supply | Qty: 30 | Fill #0

## 2016-05-30 NOTE — Patient Instructions (Addendum)
Remember to drink plenty of fluid, eat healthy meals and do not skip any meals. Try to eat protein with a every meal and eat a healthy snack such as fruit or nuts in between meals. Try to keep a regular sleep-wake schedule and try to exercise daily, particularly in the form of walking, 20-30 minutes a day, if you can.   As far as your medications are concerned, I would like to suggest: Aricept(Donepezil) Start with 1/2 pill then in one week can increase to a whole pill  As far as diagnostic testing: MRI brain, labs  I would like to see you back in 3 months, sooner if we need to. Please call us with any interim questions, concerns, problems, updates or refill requests.   Our phone number is [phone]. We also have an after hours call service for urgent matters and there is a physician on-call for urgent questions. For any emergencies you know to call 911 or go to the nearest emergency room   Alzheimer Disease Alzheimer disease is a brain disease that affects memory, thinking, and behavior. People with Alzheimer disease lose mental abilities, and the disease gets worse over time. Survival with Alzheimer disease ranges from several years to as long as 20 years. What are the causes? This condition develops when a protein called beta-amyloid forms deposits in the brain. It is not known what causes these deposits to form. What increases the risk? This condition is more likely to develop in people who:  Are elderly.  Have a family history of dementia.  Have had a brain injury.  Have heart or blood vessel disease.  Have had a stroke.  Have high blood pressure or high cholesterol.  Have diabetes. What are the signs or symptoms? Symptoms of this condition happen in three stages, which often overlap. Early stage  In this stage, you may continue to be independent. You may still be able to drive, work, and be social. Symptoms in this stage include:  Minor memory problems, such as forgetting  a name or what you read.  Difficulty with:  Paying attention.  Communicating.  Doing familiar tasks.  Learning new things.  Needing more time to do daily activities.  Anxiety.  Social withdrawal.  Loss of motivation. Moderate stage  In this stage, you will start to need care. This stage usually lasts the longest. Symptoms in this stage include:  Difficulty with expressing thoughts.  Memory loss that affects daily life. This can include forgetting:  Your address or phone number.  Events that have happened.  Parts of your personal history, like where you went to school.  Confusion about where you are or what time it is.  Difficulty in judging distance.  Changes in personality, mood, and behavior. You may be moody, irritable, angry, frustrated, fearful, anxious, or suspicious.  Poor reasoning and judgment.  Delusions or hallucinations.  Changes in sleep patterns.  Wandering and getting lost. Severe stage  In the final stage, you will need help with your personal care and dailyactivities. Symptoms in this stage include:  Worsening memory loss.  Personality changes.  Loss of awareness of your surroundings.  Changes in physical abilities, including the ability to walk, sit, and swallow.  Difficulty in communicating.  Inability to control the bladder and bowels.  Increasing confusion.  Increasing disruptive behavior. How is this diagnosed? This condition is diagnosed with an assessment by your health care provider. During this assessment, your health care provider will talk with you and your family, friends,  or caregivers about your symptoms. A thorough medical history will be taken, and you will have a physical exam and tests. Tests may include:  Lab tests, such as blood or urine tests.  Imaging tests, such as a CT scan, PET scan, or MRI.  A lumbar puncture. This test involves removing and testing a small amount of the fluid that surrounds the brain  and spinal cord.  An electroencephalogram (EEG). In this test, small metal discs are used to measure electrical activity in the brain.  Memory tests, cognitive tests, and neuropsychological tests. These tests evaluate brain function. How is this treated? At this time, there is no treatment to cure Alzheimer disease or stop it from getting worse. The goals of treatment are:  To slow down the disease.  To manage behavioral problems.  To provide you with a safe environment.  To make life easier for you and your caregivers. The following treatment options are available:  Medicines. Medicines may help to slow down memory loss and control behavioral symptoms.  Talk therapy. Talk therapy provides you with education, support, and memory aids. It is most helpful in the early stages of the condition.  Counseling or spiritual guidance. It is normal to have a lot of feelings, including anger, relief, fear, and isolation. Counseling and guidance can help you deal with these feelings.  Caregiving. This involves having caregivers help you with your daily activities. Caregivers may be family members, friends, or trained medical professionals. Caregiving can be done at home or outside the home.  Family support groups. These provide education, emotional support, and information about community resources to family members who are taking care of you. Follow these instructions at home: Medicines   Take over-the-counter and prescription medicines only as told by your health care provider.  Avoid taking medicines that can affect thinking, such as pain or sleeping medicines. Lifestyle    Make healthy lifestyle choices:  Be physically active as told by your health care provider.  Do not use any tobacco products, such as cigarettes, chewing tobacco, and e-cigarettes. If you need help quitting, ask your health care provider.  Eat a healthy diet.  Practice stress-management techniques when you get  stressed.  Stay social.  Drink enough fluid to keep your urine clear or pale yellow.  Make sure to get quality sleep. These tips can help you get a good night's rest:  Avoid napping during the day.  Keep your sleeping area dark and cool.  Avoid exercising during the few hours before you go to bed.  Avoid caffeine products in the evening. General instructions   Work with your health care provider to determine what you need help with and what your safety needs are.  If you were given a bracelet that tracks your location, make sure to wear it.  Keep all follow-up visits as told by your health care provider. This is important.  If you have questions or would like additional support, you may contact The Alzheimer's Association:  24-hour helpline: 502-156-6574  Website: [employer]co[employer]nz Contact a health care provider if:  You have nausea, vomiting, or trouble with eating.  You have dizziness, or weakness.  You have new or worsening trouble with sleeping.  You or your family members become concerned for your safety. Get help right away if:  You develop chest pain or difficulty with breathing.  You pass out. This information is not intended to replace advice given to you by your health care provider. Make sure you discuss any questions  you have with your health care provider. Document Released: 11/07/2003 Document Revised: 10/27/2015 Document Reviewed: 11/23/2014 Elsevier Interactive Patient Education  2017 [employer].

## 2016-05-30 NOTE — Telephone Encounter (Signed)
Rx printed, awaiting signature. 

## 2016-05-30 NOTE — Progress Notes (Signed)
[facility]    Provider:  Dr Jaynee Eagles Referring Provider: Boykin Nearing, MD Primary Care Physician:  Minerva Ends, MD  CC:  Memory loss  HPI:  Kara Dennis is a 76 y.o. female here as a referral from Dr. Adrian Blackwater for memory loss. Patient is here with family and interpreter. Patient says she does not have memory loss. Both daughter and patient use interpreter. The memory loss is severe. Memory changes started in 2014. Daughter provides all information vis interpreter. She became more confused in 2015. She doesn't know how many children she has. But she recognizes her children. She doesn't know the date. She doesn't know what time of the day it is. She lives with daughter. Slowly progressive. She can't differentiate between tea and soy sauce. She dresses on her own. Daughter helps with bathing. Daughter brings her food and eats independently. She can't use the phone anymore. She confuses the remote with the phone. No FHx of dementia. No hallucinations or delusions. She is often afraid of thiefs and burglers and wants to lock the doors. No falls, no swallowing dififculties. No FHx of dementia. Nomemory loss in brothers and sisters. No other focal neurologic deficits, associated symptoms, inciting events or modifiable factors.  Review of Systems: Patient complains of symptoms per HPI as well as the following symptoms: weight loss, . Pertinent negatives per HPI. All others negative.   Social History   Social History  . Marital status: Widowed    Spouse name: N/A  . Number of children: 6   . Years of education: 3   Occupational History  . Homemaker     Social History Main Topics  . Smoking status: Never Smoker  . Smokeless tobacco: Never Used  . Alcohol use No  . Drug use: No  . Sexual activity: No   Other Topics Concern  . Not on file   Social History Narrative   Lives with daughter, [place], [place].    Moved from [place] in 05/2013.   Has 6 children total.    4 in [place]   1 in [place]   1 in [place]       Has many grandchildren. 17 total.    Right-handed   Caffeine: every other day       Family History  Problem Relation Age of Onset  . Diabetes Brother   . Heart disease Neg Hx   . Cancer Neg Hx     Past Medical History:  Diagnosis Date  . Bradycardia 2014    identified before hysterectomy, treated for short time with medication before during and after hysterectomy   . Cervical cancer ([place]) 2014    s/p hysterectomy, no chemo or radiation   . Dementia   . HTN (hypertension) 2013   took some medicine in [place]     Past Surgical History:  Procedure Laterality Date  . ABDOMINAL HYSTERECTOMY  05/2012     Current Outpatient Prescriptions  Medication Sig Dispense Refill  . atorvastatin (LIPITOR) 10 MG tablet Take 1 tablet (10 mg total) by mouth daily. 90 tablet 3  . donepezil (ARICEPT) 10 MG tablet Take 1 tablet (10 mg total) by mouth at bedtime. 30 tablet 11   No current facility-administered medications for this visit.     Allergies as of 05/30/2016  . (No Known Allergies)    Vitals: BP (!) 144/88 (BP Location: Left Arm)   Pulse 83   Ht 5\' 2"  (1.575 m)   Wt 145 lb (65.8 kg)  BMI 26.52 kg/m  Last Weight:  Wt Readings from Last 1 Encounters:  05/30/16 145 lb (65.8 kg)   Last Height:   Ht Readings from Last 1 Encounters:  05/30/16 5\' 2"  (1.575 m)   Physical exam: Exam: Gen: NAD, conversant, well nourised, obese, well groomed                     CV: RRR, no MRG. No Carotid Bruits. No peripheral edema, warm, nontender Eyes: Conjunctivae clear without exudates or hemorrhage  Neuro: Detailed Neurologic Exam  Speech:    Speech is normal; fluent and spontaneous with normal comprehension.  Cognition: MMSE - Mini Mental State Exam 05/30/2016 02/16/2014  Orientation to time 0 2  Orientation to time comments - did not know year, date of day of the month   Orientation to Place 1 3  Orientation to Place-comments -  did not know state or city   Registration 3 3  Attention/ Calculation 1 5  Recall 0 3  Language- name 2 objects 2 2  Language- repeat 0 1  Language- follow 3 step command 3 3  Language- read & follow direction 1 1  Write a sentence 1 1  Copy design 1 1  Total score 13 25      The patient is oriented to person    recent and remote memory impaired;     language fluent;     Impaired attention, concentration,    fund of knowledge Cranial Nerves:    The pupils are equal, round, and reactive to light. Attempted funduscopic exam could not visualize. Visual fields are full to threat. Extraocular movements are intact. Trigeminal sensation is intact and the muscles of mastication are normal. The face is symmetric. The palate elevates in the midline. Hearing intact. Voice is normal. Shoulder shrug is normal. The tongue has normal motion without fasciculations.   Coordination:    No dysmetria  Gait:    Not ataxic  Motor Observation:    No asymmetry, no atrophy, and no involuntary movements noted. Tone:    Normal muscle tone.    Posture:    Posture is normal. normal erect    Strength:    Difficulty doing strength exam due to cognitive difficulties as well as language barrier but moving all extremities antigravity without asymmetry or any noted deficits.     Sensation: intact to LT     Reflex Exam:  DTR's:    Deep tendon reflexes in the upper and lower extremities are brisk bilaterally.   Toes:    The toes are downgoing bilaterally.   Clonus:    Clonus is absent.       Assessment/Plan:  76 year old female with dementia. MMSE 13 out of 30. Discussed dementia with daughter, different types, things we are evaluating for, and risk of dementia due to FHx.  MRI brain Labs today Follow up in 3 months with megan millikan to start Namenda  then 6 month follow up with me  Orders Placed This Encounter  Procedures  . MR BRAIN WO CONTRAST  . B12 and Folate Panel  . Methylmalonic  acid, serum  . RPR  . Vitamin D, 25-hydroxy   CC: Dr. Rowe Clack, [facility] [address] [place],  [address]  Phone [phone] Fax [phone]

## 2016-05-31 LAB — SPECIMEN STATUS REPORT

## 2016-05-31 LAB — HGB A1C W/O EAG: HEMOGLOBIN A1C: 5.8 % — AB (ref 4.8–5.6)

## 2016-05-31 NOTE — Telephone Encounter (Signed)
Signed and faxed to [facility].

## 2016-06-01 LAB — B12 AND FOLATE PANEL
FOLATE: 2.8 ng/mL — AB (ref >3.0)
VITAMIN B 12: 541 pg/mL (ref 232–1245)

## 2016-06-01 LAB — METHYLMALONIC ACID, SERUM: METHYLMALONIC ACID: 281 nmol/L (ref 0–378)

## 2016-06-01 LAB — VITAMIN D 25 HYDROXY (VIT D DEFICIENCY, FRACTURES): Vit D, 25-Hydroxy: 11 ng/mL — ABNORMAL LOW (ref 30.0–100.0)

## 2016-06-01 LAB — RPR: RPR Ser Ql: NONREACTIVE

## 2016-06-04 ENCOUNTER — Telehealth: Payer: Self-pay

## 2016-06-04 ENCOUNTER — Emergency Department (HOSPITAL_COMMUNITY)
Admission: EM | Admit: 2016-06-04 | Discharge: 2016-06-05 | Disposition: A | Discharge status: Home / Self Care | Payer: Self-pay | Attending: Dermatology | Admitting: Dermatology

## 2016-06-04 ENCOUNTER — Encounter (HOSPITAL_COMMUNITY): Payer: Self-pay

## 2016-06-04 DIAGNOSIS — R1110 Vomiting, unspecified: Secondary | ICD-10-CM | POA: Insufficient documentation

## 2016-06-04 DIAGNOSIS — Z5321 Procedure and treatment not carried out due to patient leaving prior to being seen by health care provider: Secondary | ICD-10-CM | POA: Insufficient documentation

## 2016-06-04 LAB — COMPREHENSIVE METABOLIC PANEL
ALT: 30 U/L (ref 14–54)
ANION GAP: 12 (ref 5–15)
AST: 44 U/L — ABNORMAL HIGH (ref 15–41)
Albumin: 3.8 g/dL (ref 3.5–5.0)
Alkaline Phosphatase: 113 U/L (ref 38–126)
BUN: 18 mg/dL (ref 6–20)
CO2: 22 mmol/L (ref 22–32)
Calcium: 9.3 mg/dL (ref 8.9–10.3)
Chloride: 104 mmol/L (ref 101–111)
Creatinine, Ser: 0.83 mg/dL (ref 0.44–1.00)
Glucose, Bld: 172 mg/dL — ABNORMAL HIGH (ref 65–99)
POTASSIUM: 3.4 mmol/L — AB (ref 3.5–5.1)
Sodium: 138 mmol/L (ref 135–145)
TOTAL PROTEIN: 7.6 g/dL (ref 6.5–8.1)
Total Bilirubin: 0.7 mg/dL (ref 0.3–1.2)

## 2016-06-04 LAB — CBC
HEMATOCRIT: 41.2 % (ref 36.0–46.0)
HEMOGLOBIN: 14.2 g/dL (ref 12.0–15.0)
MCH: 30.7 pg (ref 26.0–34.0)
MCHC: 34.5 g/dL (ref 30.0–36.0)
MCV: 89 fL (ref 78.0–100.0)
Platelets: 249 10*3/uL (ref 150–400)
RBC: 4.63 MIL/uL (ref 3.87–5.11)
RDW: 13.2 % (ref 11.5–15.5)
WBC: 7.3 10*3/uL (ref 4.0–10.5)

## 2016-06-04 LAB — LIPASE, BLOOD: LIPASE: 43 U/L (ref 11–51)

## 2016-06-04 NOTE — ED Triage Notes (Signed)
Pt endorses multiple episodes of vomiting since 1800 this evening after eating a sweet potato. Pt observed to be dry heaving and spitting up saliva in triage. VSS. Pt has dementia and speaks vietnamese.

## 2016-06-04 NOTE — Telephone Encounter (Signed)
Called pt's daughter (on [place]) w/ lab results and recommendations for supplements per MD. Verbalized understanding and appreciation for call.

## 2016-06-04 NOTE — Telephone Encounter (Signed)
-----   Message from Melvenia Beam, MD sent at 06/02/2016  8:57 PM EDT ----- Patient's folate is low as is her Vitamin D. I recommend she take a daily multivitamin with folic acid. In addition recommend Vitamin D 2000 IU daily. She should ask her primary care about calcium at her next appt this year(not urgent), I usually recommend 1200mg  daily in divided doses but her primary care will know more about her bone density and her calcium requirements. thanks

## 2016-06-05 ENCOUNTER — Ambulatory Visit
Admission: RE | Admit: 2016-06-05 | Discharge: 2016-06-05 | Disposition: A | Discharge status: Home / Self Care | Payer: No Typology Code available for payment source | Source: Ambulatory Visit | Attending: Infectious Disease | Admitting: Infectious Disease

## 2016-06-05 ENCOUNTER — Other Ambulatory Visit: Payer: Self-pay | Admitting: Infectious Disease

## 2016-06-05 DIAGNOSIS — Z111 Encounter for screening for respiratory tuberculosis: Secondary | ICD-10-CM

## 2016-06-05 NOTE — ED Notes (Signed)
Pt called for vital assessment; no response

## 2016-06-05 NOTE — ED Notes (Signed)
Pt called for room x 1; no response

## 2016-06-12 ENCOUNTER — Telehealth: Payer: Self-pay | Admitting: Neurology

## 2016-06-12 NOTE — Telephone Encounter (Signed)
Called pt's daughter to let her know that rx was called in to [facility] on [address]. Pt may take up to hr prior to MRI for anxiety. Verbalized understanding and appreciation for call.

## 2016-06-17 ENCOUNTER — Telehealth: Payer: Self-pay | Admitting: *Deleted

## 2016-06-17 NOTE — Telephone Encounter (Signed)
Finally spoke to [facility] and was told that they do not keep Xanax in stock. Rx faxed to [facility] w/ transmission verification. Also verified receipt via TC to pharmacy. Called daughter back w/ pharmacy/rx pick-up information. [facility] on [address] is open until 10:00 tonight. Verbalized understanding and appreciation for call.

## 2016-06-17 NOTE — Telephone Encounter (Signed)
Called pharmacy and requested call back to be sure that faxed rx was received prior to pt's MRI tomorrow.

## 2016-06-17 NOTE — Telephone Encounter (Signed)
Patient's daughter calling stating [facility] says they do not have Rx that was faxed today. Patient has MRI tomorrow.

## 2016-06-17 NOTE — Telephone Encounter (Signed)
Rx was faxed to [facility]. Left VM mssg to notify pt's daughter. May call back w/ any problems.

## 2016-06-17 NOTE — Telephone Encounter (Signed)
Rx re-faxed to [facility].

## 2016-06-18 ENCOUNTER — Ambulatory Visit
Admission: RE | Admit: 2016-06-18 | Discharge: 2016-06-18 | Disposition: A | Discharge status: Home / Self Care | Payer: No Typology Code available for payment source | Source: Ambulatory Visit | Attending: Neurology | Admitting: Neurology

## 2016-06-18 DIAGNOSIS — R413 Other amnesia: Secondary | ICD-10-CM

## 2016-06-19 ENCOUNTER — Other Ambulatory Visit: Payer: Self-pay | Admitting: Family Medicine

## 2016-06-19 DIAGNOSIS — Z1231 Encounter for screening mammogram for malignant neoplasm of breast: Secondary | ICD-10-CM

## 2016-06-21 ENCOUNTER — Ambulatory Visit
Admission: RE | Admit: 2016-06-21 | Discharge: 2016-06-21 | Disposition: A | Discharge status: Home / Self Care | Payer: No Typology Code available for payment source | Source: Ambulatory Visit | Attending: Family Medicine | Admitting: Family Medicine

## 2016-06-21 DIAGNOSIS — Z1231 Encounter for screening mammogram for malignant neoplasm of breast: Secondary | ICD-10-CM

## 2016-06-25 ENCOUNTER — Telehealth: Payer: Self-pay

## 2016-06-25 NOTE — Telephone Encounter (Signed)
Called MRI results and recommendations for follow-up. May call back w/ any additional questions/concerns.

## 2016-06-25 NOTE — Telephone Encounter (Signed)
-----   Message from Melvenia Beam, MD sent at 06/24/2016  1:38 PM EDT ----- No strokes or massess. She does have atrophy that can be seen in memory loss as well as aging. Incidentally noted is extensive mucus / fluid in the frontal, ethmoid and sphenoid sinuses. Correlated clinically for sinusitis and if symptomatic f/u with primary care or ENT.

## 2016-06-26 MED FILL — ATORVASTATIN 10 MG TABLET: 10 | 30 days supply | Qty: 30 | Fill #1

## 2016-06-26 NOTE — Telephone Encounter (Signed)
Daughter called back. Reviewed MRI results and advised to continue donepezil as prescribed for memory. Also recommended f/u again w/ PCP or ENT if pt experiencing sinus symptoms. Verbalized understanding and appreciation for call.

## 2016-06-30 DIAGNOSIS — R7303 Prediabetes: Secondary | ICD-10-CM | POA: Insufficient documentation

## 2016-06-30 NOTE — Progress Notes (Signed)
Subjective:    Patient ID: Kara Dennis, female    DOB: [birth year], 76 y.o.   MRN: [id number] Chief Complaint  Patient presents with  . Follow-up    HPI  Kara Dennis is a 76 year old [place] woman who is scheduled today for a follow-up visit - of what I am not sure as this is my first time meeting this patient - may be for ongoing sinus symptoms. She speaks [place] only and her daughter My-Phuong Willene Hatchet translates and is her/our main point of contact.   [name] was recently referred to neurology for eval of memory loss. Saw Dr. Jaynee Eagles several weeks ago and MRI noted atrophy of brain with the incidental finding of extensive mucous/fluid in the frontal, ethmoid, and spenoid sinus. Radiology recommended clinical eval for correlation of any sinusitis sxs which is I presume what brings her in today.  Sinus congestion:  Several weeks ago ago pt had an episode of feeling very poorly with emesis - this happens sporadically but is always self-limited with symptoms persisting for just 30-47min. Has been feeling fine since with no f/c but does have along of congestion with post-nasal drip with regurgitation of this with thick yellow sputum.  No eye itching but do water a little bit. No ear sxs.  Lymphnode and parotic gland still improved.  She does snore at night but unchanged.  No otc meds needed. No seasonal allergies.  Tolerating po well. Normal GI/GU. Taking the new vitamins from Dr. Ferdinand Lango.  Dementia: Started on donepezil 1 mo prior by neurology Dr. Jaynee Eagles and has follow-up scheduled for 3 and 6 months with plans to start Namenda at her next visit.  Dr. Lavell Anchors noted that the patient's memory loss is severe though the patient is in complete denial of it. Patient does recognize her children and can get dressed on her own as well as eat independently but does not know how many children she has, the date, the time of day, cannot use the phone, cannot identify common foods for her correctly or get her  own food, and needs help bathing.  Symptoms really started when she moved to the [place] in 2015. She lives with her daughter who is her full-time caretaker.  Weight steady. Folate deficiency: 1 mo prior level was 2.8 with nml >3.0 so pt was started on a daily multivitamin with folic acid by Dr. Jaynee Eagles. B12 and MMA wnml. Vitamin D deficiency: 1 mo prior level was 11 so pt was started on the mvi and an additional vitamin D 2000 units daily. Dr. Lavell Anchors asked [place] to inform patient of recommended calcium intake    HLD: On lipitor 10mg  for sev yrs and asa 81mg  qd.  Last lipid panel 1 mo prior showed LDL 124, non-HDL 166.  When patient was initially started on statin 2-1/2 years prior, her LDL was 118 with a non-HDL of 168   HTN: Controlled through [facility]  Pre-DM: hgba1c 5.8 1 mo prior  Swollen Neck Lymph Node  Hx of swollen lymph node behind left ear presented early Feb. Treated for bacterial infection with Augmentin bid as there was some overlying erythema and sent for [place] due to concern for neoplasia. Ultrasound showed a 3.8 x 2.5 x 3.0 cm complex hypoechoic abnormality that was a abscess versus necrotic mass/malignancy/lymph node. Radiology recommended a CT of the neck with contrast to determine if surgical intervention or percutaneous intervention would be indicated by better evaluating surrounding structures such as any impact on the parotid gland  or vasculature and tissue planes. Pt was then referred to ENT - saw Dr. Lucia Gaskins on 2/14 who noted that patient reported a history of several years of enlarging left parotid mass with 2 weeks of becoming acutely painful and larger. 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 Dr. Lucia Gaskins agreed that patient would definitely benefit from a CT scan of the mass, parotid gland, and neck as the next step in her evaluation but recognized that at that moment pt was without insurance and was in the  process of attempting to obtain the [facility] financial assistance. Therefore Dr. Lucia Gaskins recommended covering patient with Keflex 500 mg 3 times a day 10 days with plan to RTC for FNA of the mass on day 10 and CT scan asap. Pt has appt for CT scan next wk after which she has an a f/u appt scheduled with Dr. Lucia Gaskins following where she will likely have the FNA. At visit last mo, pt had significant improvement in swelling and resolution of any pain.    Past Medical History:  Diagnosis Date  . Bradycardia 2014    identified before hysterectomy, treated for short time with medication before during and after hysterectomy   . Cervical cancer ([place]) 2014    s/p hysterectomy, no chemo or radiation   . Dementia   . HTN (hypertension) 2013   took some medicine in [place]    Past Surgical History:  Procedure Laterality Date  . ABDOMINAL HYSTERECTOMY  05/2012    Current Outpatient Prescriptions on File Prior to Visit  Medication Sig Dispense Refill  . donepezil (ARICEPT) 10 MG tablet Take 1 tablet (10 mg total) by mouth at bedtime. 30 tablet 11   No current facility-administered medications on file prior to visit.    No Known Allergies Family History  Problem Relation Age of Onset  . Diabetes Brother   . Heart disease Neg Hx   . Cancer Neg Hx   . Dementia Neg Hx   . Breast cancer Neg Hx    Social History   Social History  . Marital status: Widowed    Spouse name: N/A  . Number of children: 6   . Years of education: 3   Occupational History  . Homemaker     Social History Main Topics  . Smoking status: Never Smoker  . Smokeless tobacco: Never Used  . Alcohol use No  . Drug use: No  . Sexual activity: No   Other Topics Concern  . None   Social History Narrative   Lives with daughter, [place], [place].    Moved from [place] in 05/2013.   Has 6 children total.   4 in [place]   1 in [place]   1 in [place]       Has many grandchildren. 17 total.    Right-handed    Caffeine: every other day      Depression screen [facility] 2/9 07/01/2016 07/01/2016 05/22/2016 04/17/2016 02/16/2014  Decreased Interest 0 0 0 0 2  Down, Depressed, Hopeless 0 0 0 0 1  PHQ - 2 Score 0 0 0 0 3  Altered sleeping - - - - 0  Tired, decreased energy - - - - 0  Change in appetite - - - - 0  Feeling bad or failure about yourself  - - - - 1  Trouble concentrating - - - - 2  Moving slowly or fidgety/restless - - - - 0  Suicidal thoughts - - - - 0  PHQ-9 Score - - - - 6    Review of Systems See hpi    Objective:   Physical Exam  Constitutional: She is oriented to person, place, and time. She appears well-developed and well-nourished. No distress.  HENT:  Head: Normocephalic and atraumatic.  Right Ear: Tympanic membrane, external ear and ear canal normal.  Left Ear: Tympanic membrane, external ear and ear canal normal.  Nose: Nose normal. No mucosal edema or rhinorrhea.  Mouth/Throat: Uvula is midline, oropharynx is clear and moist and mucous membranes are normal. No oropharyngeal exudate.  Eyes: Conjunctivae are normal. Right eye exhibits no discharge. Left eye exhibits no discharge. No scleral icterus.  Neck: Normal range of motion. No muscular tenderness present. Carotid bruit is not present. No erythema present. No thyroid mass and no thyromegaly present.    Very firm mass on left lateral neck extending down into upper cervical notes/submandibular from lower parotid area. No erythema, warmth, tenderness, fluctuance, induration. Not mobile.  Cardiovascular: Normal rate, regular rhythm, normal heart sounds and intact distal pulses.   Pulmonary/Chest: Effort normal and breath sounds normal. No respiratory distress.  Musculoskeletal: She exhibits no edema.  Lymphadenopathy:    She has no cervical adenopathy.  Neurological: She is alert and oriented to person, place, and time.  Skin: Skin is warm and dry. She is not diaphoretic. No erythema.  Psychiatric: She has a normal mood and  affect. Her behavior is normal.      BP 129/72 (BP Location: Right Arm, Patient Position: Sitting, Cuff Size: Small)   Pulse 82   Temp 98.2 F (36.8 C) (Oral)   Resp 16   Ht 5\' 2"  (1.575 m)   Wt 142 lb 3.2 oz (64.5 kg)   SpO2 97%   BMI 26.01 kg/m      Assessment & Plan:  Patient is currently getting scripts filled through the [facility]  Patient was granted [facility] card financial assistance through August 2018. She can reapply every 6 months so hopefully this will continue.  Bone density: Patient has not had any prior DEXA scan nor bone x-rays to give [place] a clue as to her bone density status. Just last week patient went for her initial (first-ever) screening mammogram at the [facility] which was negative/normal (though tech noted that there was sig difficulty positioning pt due to combo of dementia, language barrier, and ROM limitations so images were not completely ideal.)   I don't think the patient is going to benefit substantially from the primary prevention of a statin medication this time. Recent brain MRI did show some mild chronic small vessel ischemic disease though this is completely typical for her age. No history of stroke, cardiovascular disease, or equivalent but could exacerbate prediabetes and patient does have some very mild LFT elevation though this is more likely related to the elevated triglycerides rather than the statin. Consider cessation of statin.  dexa  CT with contrast of left parotid/neck as ENT did have concern for malignancy. ENT f/u? FNA? It is odd that her recent brain MRI showed punctate left parietal chronic cerebral microhemorrhage versus vascular structure. It seems unlikely that this would be correlated though the 2 abnormal findings are in the same area. The MRI also demonstrated extensive mucous/fluid in the frontal, ethmoid, and sphenoid sinuses. Pt has been treated with a 10 day course of Augmentin in February of  which the last 3 days appear to have overlapped  with a 10 day course of Keflex but this makes last antibiotic dose 2 months prior.  Has not had any hepatitis screening - does have some minimal LFT elevated but due to recent immigration may be worth checking - do have f/u for CPE which pt is going to schedule in June - daughter prefers to call to schedule this. Needs prevnar-13  EMAIL REFERRALSA BOUT BONE SCAN. EMAIL NICKI AND LAURA ABOUT "[place] CARD" COVERAGE FOR FINANCIAL SERVICES SO I KNOW IF COVERS LABS, WHAT IMAGING CENTERS, ETC. BILL FOR CERUMEN REMOVAL.  1. Sinus congestion   2. High cholesterol   3. Essential hypertension   4. Medication monitoring encounter   5. Estrogen deficiency   6. Prediabetes   7. History of cervical cancer   8. Need for prophylactic vaccination against Streptococcus pneumoniae (pneumococcus)   9. Right ear impacted cerumen     Orders Placed This Encounter  Procedures  . DG Bone Density    Standing Status:   Future    Standing Expiration Date:   08/30/2017    Order Specific Question:   Reason for Exam (SYMPTOM  OR DIAGNOSIS REQUIRED)    Answer:   estrogen deficiency    Order Specific Question:   Preferred imaging location?    Answer:   [facility]  . Pneumococcal conjugate vaccine 13-valent IM  . Comprehensive metabolic panel  . Care order/instruction:    Scheduling Instructions:     Complete orders, AVS and go.    Meds ordered this encounter  Medications  . levofloxacin (LEVAQUIN) 750 MG tablet    Sig: Take 1 tablet (750 mg total) by mouth daily.    Dispense:  7 tablet    Refill:  0    Please note that I have also discontinued pt's lipitor - she does not need to be on a cholesterol medication.  Marland Kitchen DISCONTD: fluticasone (FLONASE) 50 MCG/ACT nasal spray    Sig: Place 2 sprays into both nostrils at bedtime.    Dispense:  16 g    Refill:  0  . fluticasone (FLONASE) 50 MCG/ACT nasal spray    Sig: Place 2 sprays into both nostrils at  bedtime.    Dispense:  16 g    Refill:  0  . Guaifenesin (MUCINEX MAXIMUM STRENGTH) 1200 MG TB12    Sig: Take 1 tablet (1,200 mg total) by mouth every 12 (twelve) hours as needed.    Dispense:  14 tablet    Refill:  1    Ok to substitute other dose of guaifenesin at total daily equivalent dose if this is not available      Delman Cheadle, M.D.  Primary Care at [facility] [address] [place], [place] [address] [phone] phone [phone] fax  07/16/16 7:03 PM

## 2016-07-01 ENCOUNTER — Ambulatory Visit (INDEPENDENT_AMBULATORY_CARE_PROVIDER_SITE_OTHER): Payer: Self-pay | Admitting: Family Medicine

## 2016-07-01 ENCOUNTER — Encounter: Payer: Self-pay | Admitting: Family Medicine

## 2016-07-01 VITALS — BP 129/72 | HR 82 | Temp 98.2°F | Resp 16 | Ht 62.0 in | Wt 142.2 lb

## 2016-07-01 DIAGNOSIS — Z8541 Personal history of malignant neoplasm of cervix uteri: Secondary | ICD-10-CM

## 2016-07-01 DIAGNOSIS — I10 Essential (primary) hypertension: Secondary | ICD-10-CM

## 2016-07-01 DIAGNOSIS — R0981 Nasal congestion: Secondary | ICD-10-CM

## 2016-07-01 DIAGNOSIS — E2839 Other primary ovarian failure: Secondary | ICD-10-CM

## 2016-07-01 DIAGNOSIS — E7800 Pure hypercholesterolemia, unspecified: Secondary | ICD-10-CM

## 2016-07-01 DIAGNOSIS — H6121 Impacted cerumen, right ear: Secondary | ICD-10-CM

## 2016-07-01 DIAGNOSIS — R7303 Prediabetes: Secondary | ICD-10-CM

## 2016-07-01 DIAGNOSIS — Z23 Encounter for immunization: Secondary | ICD-10-CM

## 2016-07-01 DIAGNOSIS — Z5181 Encounter for therapeutic drug level monitoring: Secondary | ICD-10-CM

## 2016-07-01 LAB — COMPREHENSIVE METABOLIC PANEL
ALBUMIN: 4 g/dL (ref 3.5–4.8)
ALK PHOS: 130 IU/L — AB (ref 39–117)
ALT: 25 IU/L (ref 0–32)
AST: 35 IU/L (ref 0–40)
Albumin/Globulin Ratio: 1.3 (ref 1.2–2.2)
BUN / CREAT RATIO: 26 (ref 12–28)
BUN: 23 mg/dL (ref 8–27)
Bilirubin Total: 0.7 mg/dL (ref 0.0–1.2)
CALCIUM: 9.5 mg/dL (ref 8.7–10.3)
CO2: 21 mmol/L (ref 18–29)
CREATININE: 0.9 mg/dL (ref 0.57–1.00)
Chloride: 104 mmol/L (ref 96–106)
GFR calc Af Amer: 72 mL/min/{1.73_m2} (ref >59)
GFR, EST NON AFRICAN AMERICAN: 63 mL/min/{1.73_m2} (ref >59)
GLUCOSE: 104 mg/dL — AB (ref 65–99)
Globulin, Total: 3.2 g/dL (ref 1.5–4.5)
Potassium: 4.1 mmol/L (ref 3.5–5.2)
Sodium: 141 mmol/L (ref 134–144)
Total Protein: 7.2 g/dL (ref 6.0–8.5)

## 2016-07-01 MED ORDER — FLUTICASONE PROPIONATE 50 MCG/ACT NA SUSP: 2.0000 | Freq: Every day | NASAL | 0 refills | Status: DC

## 2016-07-01 MED ORDER — GUAIFENESIN ER 1200 MG PO TB12: 1.0000 | ORAL_TABLET | Freq: Two times a day (BID) | ORAL | 1 refills | Status: DC | PRN

## 2016-07-01 MED ORDER — LEVOFLOXACIN 750 MG PO TABS: 750.0000 mg | ORAL_TABLET | Freq: Every day | ORAL | 0 refills | Status: DC

## 2016-07-01 MED FILL — FLUTICASONE PROP 50 MCG SPR: 50 | 30 days supply | Qty: 16 | Fill #0

## 2016-07-01 MED FILL — ?LEVOFLOXACIN 750 MG TAB: 750 | 7 days supply | Qty: 7 | Fill #0

## 2016-07-01 NOTE — Patient Instructions (Addendum)
IF you received an x-ray today, you will receive an invoice from [facility]. Please contact [facility] at [phone] with questions or concerns regarding your invoice.   IF you received labwork today, you will receive an invoice from [place]. Please contact [facility] at [phone] with questions or concerns regarding your invoice.   Our billing staff will not be able to assist you with questions regarding bills from these companies.  You will be contacted with the lab results as soon as they are available. The fastest way to get your results is to activate your My Chart account. Instructions are located on the last page of this paperwork. If you have not heard from [place] regarding the results in 2 weeks, please contact this office.    Vim xoang, ng??i l?n (Sinusitis, Adult) Vim xoang l hi?n t??ng ?au nh?c v vim ? cc xoang m?i. Xoang la? nh??ng khoa?ng tr?ng nng trong x??ng quanh m??t quy? vi?. Xoang cu?a quy? vi? n??m ??:  Xung quanh m?t.  ?? gi??a tra?n.  Phi?a sau mu?i.  ?? x??ng go? ma? cu?a quy? vi?. Xoang va? h?c mu?i co? n?p nh?n ch??a ch?t lo?ng ???c qua?nh (di?ch nh?y). Di?ch nh?y th???ng ti?t ra t?? xoang cu?a quy? vi?. Khi m mu?i cu?a quy? vi? bi? vim ho??c s?ng, di?ch nh?y co? th? bi? ke?t la?i ho??c t??c nn khng khi? khng th? ?i qua ca?c xoang cu?a quy? vi?. Ti?nh tra?ng na?y cho phe?p vi khu?n, vi ru?t va? n?m pha?t tri?n, ?i?u na?y d?n ??n nhi?m tru?ng. Vim xoang co? th? pha?t tri?n nhanh cho?ng va? ke?o da?i 7?10 nga?y (c?p ti?nh) ho??c h?n 12 tu?n (ma?n ti?nh). Vim xoang co? th? pha?t tri?n sau khi bi? ca?m la?nh. NGUYN NHN Ti?nh tra?ng na?y co? th? xa?y ra do b?t ky? nguyn nhn gi? la?m s?ng bn trong ca?c xoang ho??c ng?n khng cho ch?t nh?y ch?y ra, bao g?m:  D? ?ng.  Hen suy?n.  Nhi?m vi khu?n ho??c vi ru?t.  Ca?c x??ng co? hi?nh da?ng b?t th???ng gi??a ca?c h?c mu?i.  Kh?i u ?? mu?i co?  ch??a di?ch nh?y (polyp mu?i).  L? xoang he?p.  Ca?c ch?t gy  nhi?m, ch??ng ha?n nh? ho?a ch?t ho??c cc ch?t ki?ch thi?ch trong khng khi?Marland Kitchen  M?t di? v?t t??c trong mu?i.  Nhi?m n?m. V?n ?? ny hi?m khi x?y ra. CC Y?U T? NGUY C? Nh?ng y?u t? sau c th? lm qu v? d? b? tnh tr?ng ny h?n:  Bi? di? ??ng ho??c hen suy?n.  G?n ?y b? ca?m la?nh ho??c nhi?m trng ???ng h h?p.  Co? bi?n da?ng c?u trc ho??c t??c trong mu?i ho??c trong xoang.  C h? mi?n d?ch b? y?u.  B?i ho??c l??n nhi?u.  Du?ng qua? nhi?u thu?c xi?t mu?i.  Ht thu?c l. TRI?U CH?NG Cc tri?u ch?ng chnh cu?a ti?nh tra?ng na?y l ?au v c?m gic n?ng xung quanh cc xoang b? ?nh h??ng. Cc tri?u ch?ng khc bao g?m:  ?au r?ng hm trn.  ?au tai.  ?au ??u.  H?i th? hi.  Suy gi?m thnh gic v v? gic.  Ho co? th? n??ng h?n va?o ban ?m.  M?t m?i.  S?t.  Ch?y di?ch ??c ?? mu?i quy? vi?. D?ch ch?y ra th??ng c mu xanh v c th? c m? (m?).  Mu?i nghe?t ho?c nga?t.  S? mu?i pha sau. ?o? la? khi co? thm di?ch nh?y ti?ch tu? ?? ho?ng ho??c ?? thnh sau mu?i.  S?ng v ?m h?n ? cc  xoang b? ?nh h??ng.  ?au h?ng.  Nh?y c?m v?i nh sng. CH?N ?ON Tnh tr?ng ny c th? ???c ch?n ?on d?a vo tri?u ch??ng, khai thc b?nh s? v khm th?c th?. ?? ti?m hi?u xem ti?nh tra?ng na?y la? c?p ti?nh hay ma?n ti?nh, chuyn gia ch?m so?c s??c kho?e cu?a quy? vi? co? th?:  Nhi?n va?o mu?i quy? vi? xem co? d?u hi?u polyp mu?i hay khng.  G vo cc xoang b? ?nh h??ng ?? ki?m tra d?u hi?u nhi?m trng.  Khm bn trong xoang c?a qu v? b?ng cch s? d?ng m?t thi?t b? t?o ?nh c g?n ?n ? ??u (n?i soi). N?u chuyn gia ch?m so?c s??c kho?e cu?a quy? vi? nghi ng?? quy? vi? bi? vim xoang ma?n ti?nh, quy? vi? cu?ng co? th?:  ???c ki?m tra di? ??ng.  L?y m?u di?ch nh?y ?? mu?i (c?y di?ch nh?y mu?i) va? ki?m tra xem co? vi khu?n khng.  Cho ki?m tra m?u di?ch nh?y ?? xem b?nh vim  xoang cu?a quy? vi? co? lin quan ??n di? ??ng hay khng. N?u b?nh vim xoang cu?a quy? vi? khng ?a?p ??ng v??i ?i?u tri? va? no? ke?o da?i h?n 8 tu?n, quy? vi? co? th? ???c chu?p MRI ho??c CT ?? ki?m tra xoang cu?a quy? vi?. Vi?c chu?p na?y cu?ng giu?p xa?c ?i?nh nhi?m tru?ng cu?a quy? vi? n?ng ??n m?c na?o. Trong ca?c tr???ng h??p hi?m g??p, quy? vi? co? th? c?n la?m sinh thi?t x??ng ?? loa?i bo? nh??ng loa?i b?nh xoang do nhi?m n?m nghim tro?ng h?n. ?I?U TR? Vi?c ?i?u tri? vim xoang tu?y thu?c va?o nguyn nhn va? ti?nh tra?ng cu?a quy? vi? la? ma?n ti?nh hay c?p ti?nh. N?u vim xoang cu?a quy? vi? do vi ru?t gy ra, ca?c tri?u ch??ng cu?a quy? vi? se? t?? h?t trong vo?ng 10 nga?y. Quy? vi? cu?ng co? th? ????c cho du?ng thu?c ?? gia?m nhe? ca?c tri?u ch??ng, bao g?m:  Thu?c ch?ng sung huy?t mu?i t?i ch?. Thu?c na?y la?m ca?c h?c mu?i s?ng co la?i va? ?? cho di?ch nha?y ch?y ra kh?i ca?c xoang.  Thu?c khng histamine. Nh??ng thu?c na?y ng?n ti?nh tra?ng vim do di? ??ng gy nn. Vi?c na?y co? th? giu?p gia?m s?ng ?? mu?i va? xoang cu?a quy? vi?.  Cc thu?c corticosteroid xi?t mu?i t?i ch?. ?y la? thu?c xi?t mu?i la?m gia?m vim va? gi?m s?ng ?? mu?i va? xoang cu?a quy? vi?.  N???c mu?i r??a mu?i. Nh??ng lo?i n???c r??a na?y co? th? giu?p loa?i bo? di?ch nh?y ???c trong mu?i quy? vi?. N?u ti?nh tra?ng cu?a quy? vi? do vi khu?n gy ra, quy? vi? se? ????c cho du?ng thu?c kha?ng sinh. N?u ti?nh tra?ng cu?a quy? vi? do n?m gy ra, quy? vi? se? ????c cho du?ng thu?c kha?ng n?m. Ph?u thu?t co? th? c?n thi?t ?? ?i?u tri? tnh tr?ng b?nh ly? lin quan, ch??ng ha?n nh? he?p h?c mu?i. Ph?u thu?t cu?ng co? th? c?n thi?t ?? c??t bo? polyp. H??NG D?N CH?M [place] T?I NH Thu?c  Ch? u?ng, dng ho?c bi thu?c khng c?n k ??n v thu?c c?n k ??n theo ch? d?n c?a chuyn gia ch?m [place] s?c kh?e. Nh??ng thu?c na?y bao g?m ca? thu?c xi?t mu?i.  N?u qu v?  ???c k thu?c khng sinh, hy dng thu?c theo ch? d?n c?a chuyn gia ch?m [place] s?c kh?e. Khng d?ng u?ng thu?c khng sinh ngay c? khi qu v? b?t ??u c?m th?y ?? h?n. U?ng ?u? n???c va? la?m ?m  U?ng ?? n??c ?? n??c  ti?u trong ho?c c mu vng nh?t. Gi?? cho c? th? ?u? n???c se? giu?p la?m loa?ng di?ch nh?y.  S?? du?ng m?t ma?y ta?o s??ng mu? la?m ?m ma?t ?? gi?? ?? ?m trong nha? quy? vi? ?? m??c trn 50%.  Hi?t h?i n???c trong 10-15 phu?t, 3-4 m?i nga?y ho??c theo chi? d?n cu?a chuyn gia ch?m so?c s??c kho?e. Quy? vi? c th? la?m vi?c na?y trong pho?ng t??m khi vo?i sen n???c no?ng ?ang cha?y.  Ha?n ch? ti?p xu?c v??i khng khi? la?nh ho??c kh. Ngh? ng?i  Ngh? ng?i cng nhi?u cng t?t.  Ngu? g?i ??u cao (nng cao).  Ba?o ?a?m ngu? ?u? gi?c va?o m?i ?m. H??ng d?n chung  Ch??m kh?n ?m, ?m ln m?t 3-4 l?n m?i ngy ho?c theo ch? d?n c?a chuyn gia ch?m [place] s?c kh?e. Vi?c na?y se? giu?p gia?m kho? chi?u.  R??a tay th???ng xuyn b??ng xa? pho?ng va? n???c ?? gia?m ti?p xu?c v??i vi ru?t va? ca?c vi tru?ng kha?c. N?u khng c x phng v n??c, hy dng thu?c st trng tay.  Khng ht thu?c. Tra?nh ?? ca?nh nh??ng ng???i hu?t thu?c (kho?i thu?c thu? ??ng).  Tun th? t?t c? cc cu?c h?n khm l?i theo ch? d?n c?a chuyn gia ch?m [place] s?c kh?e. ?i?u ny c vai tr quan tr?ng. ?I KHM N?U:  Qu v? b? s?t.  Tri?u ch?ng c?a qu v? tr?m tr?ng h?n.  Tri?u ch?ng c?a qu v? khng c?i thi?n trong 10 ngy. NGAY L?P T?C ?I KHM N?U:  Qu v? b? ?au ??u nhi?u.  Qu v? lin t?c nn m?a.  Qu v? b? ?au ho??c s?ng xung quanh m?t ho??c m??t.  Qu v? c v?n ?? v? th? l?c.  Qu v? b? l l?n.  C? qu v? b? c?ng.  Qu v? b? kh th?. Thng tin ny khng nh?m m?c ?ch thay th? cho l?i khuyn m chuyn gia ch?m [place] s?c kh?e ni v?i qu v?. Hy b?o ??m qu v? ph?i th?o lu?n b?t k? v?n ?? g m qu v? c v?i chuyn gia ch?m [place] s?c kh?e c?a qu v?. Document Released: 08/27/2011  Document Revised: 06/19/2015 Document Reviewed: 12/21/2014 Elsevier Interactive Patient Education  2017 [employer].

## 2016-07-02 ENCOUNTER — Encounter: Payer: Self-pay | Admitting: Family Medicine

## 2016-07-12 ENCOUNTER — Telehealth: Payer: Self-pay | Admitting: Neurology

## 2016-07-12 NOTE — Telephone Encounter (Signed)
Called pt's daughter w/ instructions to hold med for 1 wk to see if her eating improves. Daughter agreed to call back next week w/ update. Voiced appreciation for call.

## 2016-07-12 NOTE — Telephone Encounter (Signed)
Pt would like to know if a side affect to the donepezil (ARICEPT) 10 MG tablet  Could cause pt to not want to eat, pt has had a decrease in appetite for about 2 weeks.  Please call

## 2016-07-12 NOTE — Telephone Encounter (Signed)
Have her stop the medication for a week and see if appetite improves.thanks

## 2016-08-13 MED FILL — DONEPEZIL HCL 10 MG TABLET: 10 | 30 days supply | Qty: 30 | Fill #1

## 2016-09-02 ENCOUNTER — Encounter: Payer: Self-pay | Admitting: Adult Health

## 2016-09-02 ENCOUNTER — Encounter (INDEPENDENT_AMBULATORY_CARE_PROVIDER_SITE_OTHER): Payer: Self-pay

## 2016-09-02 ENCOUNTER — Ambulatory Visit (INDEPENDENT_AMBULATORY_CARE_PROVIDER_SITE_OTHER): Payer: Self-pay | Admitting: Adult Health

## 2016-09-02 VITALS — BP 156/88 | HR 82 | Ht 62.0 in | Wt 138.4 lb

## 2016-09-02 DIAGNOSIS — R413 Other amnesia: Secondary | ICD-10-CM

## 2016-09-02 MED ORDER — MEMANTINE HCL 5 MG PO TABS: 5.0000 mg | ORAL_TABLET | Freq: Two times a day (BID) | ORAL | 5 refills | Status: DC

## 2016-09-02 MED FILL — MEMANTINE HCL 5 MG TABLET: 5 | 30 days supply | Qty: 60 | Fill #0

## 2016-09-02 NOTE — Progress Notes (Signed)
PATIENT: Kara Dennis DOB: 01-Nov-1940  REASON FOR VISIT: follow up- memory disturbance HISTORY FROM: patient, daughter and interpretor  HISTORY OF PRESENT ILLNESS:  Today 09/02/16: Ms. Kara Dennis is a 76 year old female with a history of memory loss. She is here today with her daughter and an interpreter. Her daughter reports that she feels that her memory has gotten a little better. She still notices some changes with her short-term memory. The patient lives with her daughter. She does require assistance with ADLs. She is not operating a motor vehicle. She does not prepare any meals. Her daughter handles all of her finances. Daughter denies any changes in mood or behavior. Denies hallucinations. Reports that the patient is sleeping well. They deny any new neurological symptoms. She returns today for an evaluation.  HISTORY 05/30/16: Kara Dennis is a 76 y.o. female here as a referral from Dr. Adrian Blackwater for memory loss. Patient is here with family and interpreter. Patient says she does not have memory loss. Both daughter and patient use interpreter. The memory loss is severe. Memory changes started in 2014. Daughter provides all information vis interpreter. She became more confused in 2015. She doesn't know how many children she has. But she recognizes her children. She doesn't know the date. She doesn't know what time of the day it is. She lives with daughter. Slowly progressive. She can't differentiate between tea and soy sauce. She dresses on her own. Daughter helps with bathing. Daughter brings her food and eats independently. She can't use the phone anymore. She confuses the remote with the phone. No FHx of dementia. No hallucinations or delusions. She is often afraid of thiefs and burglers and wants to lock the doors. No falls, no swallowing dififculties. No FHx of dementia. Nomemory loss in brothers and sisters. No other focal neurologic deficits, associated symptoms, inciting events or  modifiable factors.   REVIEW OF SYSTEMS: Out of a complete 14 system review of symptoms, the patient complains only of the following symptoms, and all other reviewed systems are negative.  See HPI  ALLERGIES: No Known Allergies  HOME MEDICATIONS: Outpatient Medications Prior to Visit  Medication Sig Dispense Refill  . donepezil (ARICEPT) 10 MG tablet Take 1 tablet (10 mg total) by mouth at bedtime. 30 tablet 11  . fluticasone (FLONASE) 50 MCG/ACT nasal spray Place 2 sprays into both nostrils at bedtime. 16 g 0  . Guaifenesin (MUCINEX MAXIMUM STRENGTH) 1200 MG TB12 Take 1 tablet (1,200 mg total) by mouth every 12 (twelve) hours as needed. 14 tablet 1  . levofloxacin (LEVAQUIN) 750 MG tablet Take 1 tablet (750 mg total) by mouth daily. 7 tablet 0   No facility-administered medications prior to visit.     PAST MEDICAL HISTORY: Past Medical History:  Diagnosis Date  . Bradycardia 2014    identified before hysterectomy, treated for short time with medication before during and after hysterectomy   . Cervical cancer ([place]) 2014    s/p hysterectomy, no chemo or radiation   . Dementia   . HTN (hypertension) 2013   took some medicine in [place]     PAST SURGICAL HISTORY: Past Surgical History:  Procedure Laterality Date  . ABDOMINAL HYSTERECTOMY  05/2012     FAMILY HISTORY: Family History  Problem Relation Age of Onset  . Diabetes Brother   . Heart disease Neg Hx   . Cancer Neg Hx   . Dementia Neg Hx   . Breast cancer Neg Hx  SOCIAL HISTORY: Social History   Social History  . Marital status: Widowed    Spouse name: N/A  . Number of children: 6   . Years of education: 3   Occupational History  . Homemaker     Social History Main Topics  . Smoking status: Never Smoker  . Smokeless tobacco: Never Used  . Alcohol use No  . Drug use: No  . Sexual activity: No   Other Topics Concern  . Not on file   Social History Narrative   Lives with daughter, [place],  [place].    Moved from [place] in 05/2013.   Has 6 children total.   4 in [place]   1 in [place]   1 in [place]       Has many grandchildren. 17 total.    Right-handed   Caffeine: every other day         PHYSICAL EXAM  Vitals:   09/02/16 0839  BP: (!) 156/88  Pulse: 82  Weight: 138 lb 6.4 oz (62.8 kg)  Height: 5\' 2"  (1.575 m)   Body mass index is 25.31 kg/m.  MMSE - Mini Mental State Exam 09/02/2016 05/30/2016 02/16/2014  Orientation to time 0 0 2  Orientation to time comments - - did not know year, date of day of the month   Orientation to Place 0 1 3  Orientation to Place-comments - - did not know state or city   Registration 3 3 3   Attention/ Calculation 5 1 5   Recall 0 0 3  Language- name 2 objects 2 2 2   Language- repeat 1 0 1  Language- follow 3 step command 2 3 3   Language- read & follow direction 1 1 1   Write a sentence 0 1 1  Copy design 1 1 1   Total score 15 13 25      Generalized: Well developed, in no acute distress   Neurological examination  Mentation: Alert. Follows all commands. Cranial nerve II-XII: Pupils were equal round reactive to light. Extraocular movements were full, visual field were full on confrontational test. Facial sensation and strength were normal. Uvula tongue midline. Head turning and shoulder shrug  were normal and symmetric. Motor: The motor testing reveals 5 over 5 strength of all 4 extremities. Good symmetric motor tone is noted throughout.  Sensory: Sensory testing is intact to soft touch on all 4 extremities. No evidence of extinction is noted.  Coordination: Cerebellar testing reveals good finger-nose-finger and heel-to-shin bilaterally.  Gait and station: Gait is normal.  Reflexes: Deep tendon reflexes are symmetric and normal bilaterally.   DIAGNOSTIC DATA (LABS, IMAGING, TESTING) - I reviewed patient records, labs, notes, testing and imaging myself where available.  Lab Results  Component Value Date   WBC 7.3 06/04/2016   HGB  14.2 06/04/2016   HCT 41.2 06/04/2016   MCV 89.0 06/04/2016   PLT 249 06/04/2016      Component Value Date/Time   NA 141 07/01/2016 0938   K 4.1 07/01/2016 0938   CL 104 07/01/2016 0938   CO2 21 07/01/2016 0938   GLUCOSE 104 (H) 07/01/2016 0938   GLUCOSE 172 (H) 06/04/2016 2003   BUN 23 07/01/2016 0938   CREATININE 0.90 07/01/2016 0938   CREATININE 0.85 01/28/2014 1158   CALCIUM 9.5 07/01/2016 0938   PROT 7.2 07/01/2016 0938   ALBUMIN 4.0 07/01/2016 0938   AST 35 07/01/2016 0938   ALT 25 07/01/2016 0938   ALKPHOS 130 (H) 07/01/2016 0938   BILITOT 0.7 07/01/2016  [place] 07/01/2016 0938   GFRNONAA 68 01/28/2014 1158   GFRAA 72 07/01/2016 0938   GFRAA 79 01/28/2014 1158   Lab Results  Component Value Date   CHOL 211 (H) 05/22/2016   HDL 45 05/22/2016   LDLCALC 124 (H) 05/22/2016   TRIG 212 (H) 05/22/2016   CHOLHDL 4.7 (H) 05/22/2016   Lab Results  Component Value Date   HGBA1C 5.8 (H) 05/22/2016   Lab Results  Component Value Date   OZHYQMVH84 696 05/30/2016   Lab Results  Component Value Date   TSH 1.660 05/22/2016      ASSESSMENT AND PLAN 76 y.o. year old female  has a past medical history of Bradycardia (2014 ); Cervical cancer ([place]) (2014 ); Dementia; and HTN (hypertension) (2013). here with:  1. Memory disturbance  The patient's memory score has remained stable. She will continue on Aricept. I will start her on Namenda 5 mg twice a day. I have reviewed this medication with the patient and her daughter. Also provided them with a handout. If the patient is tolerating this medication they will call in 1 month for an increase in dose. Patient will plan to follow-up in 6 months with Dr. Jaynee Eagles or sooner if needed.  I spent 15 minutes with the patient. 50% of this time was spent reviewing memory score and medication Namenda.    Ward Givens, MSN, NP-C 09/02/2016, 8:51 AM [facility] [address] [place], [place]  [address] [phone]

## 2016-09-02 NOTE — Patient Instructions (Signed)
Continue Aricept  Start Namenda 5 mg twice a day ( in the morning and at bedtime)  Memantine Tablets ?y l thu?c g? MEMANTINE ???c dng ?? ?i?u tr? ch?ng m?t tr nh? do b?nh Alzheimer gy ra. Thu?c ny c th? ???c dng cho nh?ng m?c ?ch khc; hy h?i ng??i cung c?p d?ch v? y t? ho?c d??c s? c?a mnh, n?u qu v? c th?c m?c. (CC) NHN HI?U PH? BI?N: Namenda Ti c?n ph?i bo cho ng??i cung c?p d?ch v? y t? c?a mnh ?i?u g tr??c khi dng thu?c ny? H? c?n bi?t li?u qu v? hi?n c b?t k? tnh tr?ng no sau ?y hay khng: -kh ?i ti?u -b?nh th?n -b?nh gan -co gi?t -pha?n ??ng b?t th???ng ho??c di? ??ng v??i memantine -pha?n ??ng b?t th???ng ho??c di? ??ng v??i ca?c d??c ph?m kha?c -pha?n ??ng b?t th???ng ho??c di? ??ng v??i th??c ph?m, thu?c nhu?m, ho??c ch?t ba?o qua?n -?ang c thai ho??c ??nh co? thai -?ang cho con bu? Ti nn s? d?ng thu?c ny nh? th? no? U?ng thu?c ny v?i m?t ly n??c. Hy lm theo cc h??ng d?n trn h?p thu?c ho?c nhn thu?c. Qu v? c th? u?ng thu?c ny cng ho?c khng cng v?i th?c ?n. Dng thu?c ny vo nh?ng kho?ng th?i gian ??u nhau. Khng ???c dng thu?c ny nhi?u l?n h?n ? ???c ch? d?n. Ti?p t?c dng thu?c ny, ngay c? khi qu v? c?m th?y kh h?n. Khng ???c ng?ng s? d?ng thu?c ny, ngo?i tr? ?ang lm theo l?i khuyn c?a bc s?. Hy bn v?i bc s? nhi khoa c?a qu v? v? vi?c dng thu?c ny ? tr? em. C th? c?n ch?m [place] ??c bi?t. Qu li?u: N?u qu v? cho r?ng mnh ? dng qu nhi?u thu?c ny, th hy lin l?c v?i trung tm ki?m sot ch?t ??c ho?c phng c?p c?u ngay l?p t?c. L?U : Thu?c ny ch? dnh ring cho qu v?. Khng chia s? thu?c ny v?i nh?ng ng??i khc. N?u ti l? qun m?t li?u th sao? N?u qu v? l? qun m?t li?u thu?c, hy dng li?u thu?c ? ngay khi c th?. N?u h?u nh? ? ??n gi? dng li?u thu?c k? ti?p, th ch? dng li?u thu?c k? ti?p ?Marland Kitchen Khng ???c dng li?u g?p ?i ho?c dng thm li?u. N?u qu v? khng u?ng thu?c ny trong vi ngy, th  hy lin l?c v?i bc s? ho?c [place] vin y t? c?a mnh. Li?u dng c?a thu?c ny c th? c?n ph?i ???c thay ??i. Nh?ng g c th? t??ng tc v?i thu?c ny? -acetazolamide -amantadine -cimetidine -dextromethorphan -dofetilide -hydrochlorothiazide -ketamine -metformin -methazolamide -quinidine -ranitidine -sodium bicarbonate -triamterene Danh sch ny c th? khng m t? ?? h?t cc t??ng tc c th? x?y ra. Hy ??a cho ng??i cung c?p d?ch v? y t? c?a mnh danh sch t?t c? cc thu?c, th?o d??c, cc thu?c khng c?n toa, ho?c cc ch? ph?m b? sung m qu v? dng. C?ng nn bo cho h? bi?t r?ng qu v? c ht thu?c, u?ng r??u, ho?c c s? d?ng ma ty tri php hay khng. Vi th? c th? t??ng tc v?i thu?c c?a qu v?. Ti c?n ph?i theo di ?i?u g trong khi dng thu?c ny? Hy ?i g?p bc s? ho?c [place] vin y t? ?? theo di ??nh k? s? c?i thi?n c?a qu v?. Hy lin l?c v?i bc s? ho?c chuyn vin y t?, n?u cc tri?u ch?ng c?a qu v? khng c?i thi?n ho?c tr? nn  n?ng h?n. Qu v? c th? b? bu?n ng? ho?c chng m?t. Khng ???c li xe, s? d?ng my mc, ho?c lm nh?ng vi?c c?n ph?i t?nh to cho t?i khi qu v? bi?t ???c thu?c ny ?nh h??ng ln qu v? nh? th? no. Khng ???c ng?i d?y ho?c ??ng d?y nhanh, ??c bi?t l khi qu v? l b?nh nhn l?n tu?i. ?i?u ny lm gi?m nguy c? b? chng m?t ho?c ng?t x?u. R??u c th? lm cho qu v? chng m?t v bu?n ng? h?n. Hy trnh cc ?? u?ng c r??u. Ti c th? nh?n th?y nh?ng tc d?ng ph? no khi dng thu?c ny? Nh?ng tc d?ng ph? qu v? c?n ph?i bo cho bc s? ho?c chuyn vin y t? cng s?m cng t?t: -cc ph?n ?ng d? ?ng, ch?ng h?n nh? da b? m?n ??, ng?a, n?i my ?ay, s?ng ? m?t, mi, ho?c l??i -gy h?n ho?c c c?m gic b?n ch?n -tm tr?ng chn n?n -chng m?t -?o gic -m?n ??, r?p da, bong ho?c trc da, bao g?m bn trong mi?ng. -co gi?t -i m?a Cc tc d?ng ph? khng c?n ph?i ch?m [place] y t? (hy bo cho bc s? ho?c chuyn vin y t?, n?u cc tc d?ng ph? ny ti?p di?n ho?c gy  phi?n toi): -to bn -tiu ch?y -?au ??u -bu?n i -kh ng? Danh sch ny c th? khng m t? ?? h?t cc tc d?ng ph? c th? x?y ra. Xin g?i t?i bc s? c?a mnh ?? ???c c? v?n chuyn mn v? cc tc d?ng ph?Sander Nephew v? c th? t??ng trnh cc tc d?ng ph? cho FDA theo s? [phone]. Ti nn c?t gi? thu?c c?a mnh ? ?u? ?? ngoi t?m tay tr? em. C?t gi? ? nhi?t ?? phng t? 15 ??n 30 ?? C (59 ??n 86 ?? F). V?t b? t?t c? thu?c ch?a dng sau ngy h?t h?n in trn nhn thu?c ho?c bao thu?c. L?U : ?y l b?n tm t?t. N c th? khng bao hm t?t c? thng tin c th? c. N?u qu v? th?c m?c v? thu?c ny, xin trao ??i v?i bc s?, d??c s?, ho?c ng??i cung c?p d?ch v? y t? c?a mnh.  2018 Elsevier/Gold Standard (2013-02-12 00:00:00)

## 2016-09-05 ENCOUNTER — Other Ambulatory Visit (HOSPITAL_COMMUNITY): Payer: Self-pay | Admitting: Otolaryngology

## 2016-09-05 DIAGNOSIS — K1123 Chronic sialoadenitis: Secondary | ICD-10-CM

## 2016-09-17 ENCOUNTER — Ambulatory Visit (HOSPITAL_COMMUNITY)
Admission: RE | Admit: 2016-09-17 | Discharge: 2016-09-17 | Disposition: A | Discharge status: Home / Self Care | Payer: Self-pay | Source: Ambulatory Visit | Attending: Otolaryngology | Admitting: Otolaryngology

## 2016-09-17 DIAGNOSIS — K1123 Chronic sialoadenitis: Secondary | ICD-10-CM | POA: Insufficient documentation

## 2016-09-21 NOTE — Progress Notes (Signed)
Personally  participated in, made any corrections needed, and agree with history, physical, neuro exam,assessment and plan as stated.     Randale Carvalho, MD [facility]     

## 2016-09-26 ENCOUNTER — Other Ambulatory Visit (HOSPITAL_COMMUNITY): Payer: Self-pay | Admitting: Otolaryngology

## 2016-09-26 DIAGNOSIS — K1123 Chronic sialoadenitis: Secondary | ICD-10-CM

## 2016-09-26 MED FILL — DONEPEZIL HCL 10 MG TABLET: 10 | 30 days supply | Qty: 30 | Fill #2

## 2016-09-30 ENCOUNTER — Encounter: Payer: Self-pay | Admitting: Family Medicine

## 2016-10-01 ENCOUNTER — Ambulatory Visit: Payer: Self-pay | Attending: Family Medicine | Admitting: Family Medicine

## 2016-10-01 ENCOUNTER — Encounter: Payer: Self-pay | Admitting: Family Medicine

## 2016-10-01 VITALS — BP 130/78 | HR 73 | Temp 98.0°F | Resp 18 | Ht 62.0 in | Wt 136.8 lb

## 2016-10-01 DIAGNOSIS — E785 Hyperlipidemia, unspecified: Secondary | ICD-10-CM | POA: Insufficient documentation

## 2016-10-01 DIAGNOSIS — R7303 Prediabetes: Secondary | ICD-10-CM | POA: Insufficient documentation

## 2016-10-01 DIAGNOSIS — I10 Essential (primary) hypertension: Secondary | ICD-10-CM | POA: Insufficient documentation

## 2016-10-01 DIAGNOSIS — Z23 Encounter for immunization: Secondary | ICD-10-CM

## 2016-10-01 DIAGNOSIS — E2839 Other primary ovarian failure: Secondary | ICD-10-CM | POA: Insufficient documentation

## 2016-10-01 DIAGNOSIS — F0390 Unspecified dementia without behavioral disturbance: Secondary | ICD-10-CM | POA: Insufficient documentation

## 2016-10-01 DIAGNOSIS — Z0000 Encounter for general adult medical examination without abnormal findings: Secondary | ICD-10-CM | POA: Insufficient documentation

## 2016-10-01 LAB — POCT GLYCOSYLATED HEMOGLOBIN (HGB A1C): Hemoglobin A1C: 5.5

## 2016-10-01 LAB — GLUCOSE, POCT (MANUAL RESULT ENTRY): POC Glucose: 105 mg/dl — AB (ref 70–99)

## 2016-10-01 MED ORDER — LISINOPRIL 5 MG PO TABS: 5.0000 mg | ORAL_TABLET | Freq: Every day | ORAL | 2 refills | Status: DC

## 2016-10-01 MED FILL — ?LISINOPRIL 5 MG TABLET: 5 | 30 days supply | Qty: 30 | Fill #0

## 2016-10-01 NOTE — Progress Notes (Signed)
Subjective:  Patient ID: Kara Dennis, female    DOB: 1941-02-26  Age: 76 y.o. MRN: [id number]  CC: Hypertension   HPI Kara Dennis presents for . She is accompanied by her daughter. Interpreter services used. PMH of HTN, prediabetes, and dementia.She does not check BP at home. Cardiac symptoms none. Patient denies chest pain, chest pressure/discomfort, claudication, dyspnea, lower extremity edema, near-syncope, palpitations and syncope.  Cardiovascular risk factors: advanced age (older than 11 for men, 81 for women), dyslipidemia, hypertension and sedentary lifestyle.  She reports patient uses lipitor infrequently. Use of agents associated with hypertension: none. History of target organ damage: none. Dementia: She is here for evaluation and treatment of cognitive problems. She is accompanied by patient and daughter. Primary caregiver is patient and daughter. The family and the patient identify problems with changes in short and long term memory. Recent history of neurology appointment in June. Patient started on namenda and aricept, her family daughter monitors medication usage. Daughter reports improved mood improvement and less agitation. Functional Assessment:  Activities of Daily Living (ADLs):   She is independent in the following: ambulation Requires assistance with the following: bathing and hygiene, feeding, grooming, toileting and dressing  Outpatient Medications Prior to Visit  Medication Sig Dispense Refill  . donepezil (ARICEPT) 10 MG tablet Take 1 tablet (10 mg total) by mouth at bedtime. 30 tablet 11  . memantine (NAMENDA) 5 MG tablet Take 1 tablet (5 mg total) by mouth 2 (two) times daily. 60 tablet 5   No facility-administered medications prior to visit.     ROS Review of Systems  Constitutional: Negative.   Respiratory: Negative.   Cardiovascular: Negative.   Gastrointestinal: Negative.   Musculoskeletal: Negative.   Neurological:       History of memory loss    Psychiatric/Behavioral: Negative.     Objective:  BP 130/78 (BP Location: Left Arm, Patient Position: Sitting, Cuff Size: Normal)   Pulse 73   Temp 98 F (36.7 C) (Oral)   Resp 18   Ht 5\' 2"  (1.575 m)   Wt 136 lb 12.8 oz (62.1 kg)   SpO2 97%   BMI 25.02 kg/m   BP/Weight 10/01/2016 09/02/2016 8/34/1962  Systolic BP 229 798 921  Diastolic BP 78 88 72  Wt. (Lbs) 136.8 138.4 142.2  BMI 25.02 25.31 26.01    Physical Exam  Constitutional: She appears well-developed and well-nourished.  Eyes: Pupils are equal, round, and reactive to light. Conjunctivae are normal.  Neck: No JVD present.  Cardiovascular: Normal rate, regular rhythm, normal heart sounds and intact distal pulses.   Pulmonary/Chest: Effort normal and breath sounds normal.  Abdominal: Soft. Bowel sounds are normal. There is no tenderness.  Neurological: She is alert.  A/Ox1 : Oriented to person.  Skin: Skin is warm and dry.  Psychiatric: She has a normal mood and affect.  Nursing note and vitals reviewed.  Assessment & Plan:   Problem List Items Addressed This Visit      Cardiovascular and Mediastinum   HTN (hypertension) - Primary (Chronic)   Schedule BP recheck in 2 weeks with clinic RN.   If BP is greater than 90/60 (MAP 65 or greater) but not less than 130/80 may increase dose .    of lisinopril to 10 mg QD and recheck in another 2 weeks   Relevant Medications   lisinopril (PRINIVIL,ZESTRIL) 5 MG tablet     Other   Prediabetes   Relevant Orders   POCT glycosylated hemoglobin (Hb A1C) (  Completed)   Glucose (CBG) (Completed)    Other Visit Diagnoses    Dyslipidemia       Relevant Orders   Lipid Panel   Estrogen deficiency       Relevant Orders   DG Bone Density   Healthcare maintenance       Relevant Orders   DG Bone Density   Tdap vaccine greater than or equal to 7yo IM (Completed)      Meds ordered this encounter  Medications  . lisinopril (PRINIVIL,ZESTRIL) 5 MG tablet    Sig: Take 1  tablet (5 mg total) by mouth daily.    Dispense:  30 tablet    Refill:  2    Order Specific Question:   Supervising Provider    Answer:   Tresa Garter [id number]    Follow-up: Return in about 2 weeks (around 10/15/2016) for BP check with clinic RN.   Alfonse Spruce FNP

## 2016-10-01 NOTE — Progress Notes (Signed)
Patient is here for establish care   Patient is here for f/up HTN /prediabetes

## 2016-10-02 ENCOUNTER — Ambulatory Visit (HOSPITAL_COMMUNITY)
Admission: RE | Admit: 2016-10-02 | Discharge: 2016-10-02 | Disposition: A | Discharge status: Home / Self Care | Payer: Self-pay | Source: Ambulatory Visit | Attending: Otolaryngology | Admitting: Otolaryngology

## 2016-10-02 DIAGNOSIS — K1123 Chronic sialoadenitis: Secondary | ICD-10-CM | POA: Insufficient documentation

## 2016-10-02 LAB — LIPID PANEL
CHOL/HDL RATIO: 3 ratio (ref 0.0–4.4)
Cholesterol, Total: 142 mg/dL (ref 100–199)
HDL: 48 mg/dL (ref >39)
LDL Calculated: 65 mg/dL (ref 0–99)
Triglycerides: 143 mg/dL (ref 0–149)
VLDL Cholesterol Cal: 29 mg/dL (ref 5–40)

## 2016-10-02 MED FILL — ?ATORVASTATIN 10 MG TABLET: 10 | 30 days supply | Qty: 30 | Fill #2

## 2016-10-04 ENCOUNTER — Ambulatory Visit: Payer: Self-pay | Attending: Family Medicine

## 2016-10-07 ENCOUNTER — Other Ambulatory Visit: Payer: Self-pay | Admitting: Family Medicine

## 2016-10-07 DIAGNOSIS — E782 Mixed hyperlipidemia: Secondary | ICD-10-CM

## 2016-10-07 MED ORDER — ATORVASTATIN CALCIUM 10 MG PO TABS: 10.0000 mg | ORAL_TABLET | Freq: Every day | ORAL | 3 refills | Status: DC

## 2016-10-07 MED FILL — MEMANTINE HCL 5 MG TABLET: 5 | 30 days supply | Qty: 60 | Fill #1

## 2016-10-16 ENCOUNTER — Telehealth: Payer: Self-pay

## 2016-10-16 NOTE — Telephone Encounter (Signed)
-----   Message from Alfonse Spruce, [place] sent at 10/07/2016  1:44 PM EDT ----- Lipid levels have improved with atorvastatin at current dosage. Continue to take medication.  Follow up in 3 months .

## 2016-10-16 NOTE — Telephone Encounter (Signed)
CMA call regarding lab results   Interpreter name & ID tran  [phone]  Patient Verify DOB  Patient was aware and understood

## 2016-10-17 ENCOUNTER — Encounter: Payer: Self-pay | Admitting: Family Medicine

## 2016-10-22 ENCOUNTER — Ambulatory Visit
Admission: RE | Admit: 2016-10-22 | Discharge: 2016-10-22 | Disposition: A | Discharge status: Home / Self Care | Payer: No Typology Code available for payment source | Source: Ambulatory Visit | Attending: Family Medicine | Admitting: Family Medicine

## 2016-10-22 DIAGNOSIS — E2839 Other primary ovarian failure: Secondary | ICD-10-CM

## 2016-10-22 DIAGNOSIS — Z0000 Encounter for general adult medical examination without abnormal findings: Secondary | ICD-10-CM

## 2016-10-28 MED FILL — DONEPEZIL HCL 10 MG TABLET: 10 | 30 days supply | Qty: 30 | Fill #3

## 2016-10-28 MED FILL — ?LISINOPRIL 5 MG TABLET: 5 | 30 days supply | Qty: 30 | Fill #1

## 2016-10-28 MED FILL — ?ATORVASTATIN 10 MG TABLET: 10 | 30 days supply | Qty: 30 | Fill #3

## 2016-11-13 MED FILL — MEMANTINE HCL 5 MG TABLET: 5 | 30 days supply | Qty: 60 | Fill #2

## 2016-11-14 ENCOUNTER — Ambulatory Visit: Payer: Self-pay | Attending: Family Medicine | Admitting: Family Medicine

## 2016-11-14 ENCOUNTER — Encounter: Payer: Self-pay | Admitting: Family Medicine

## 2016-11-14 VITALS — BP 145/83 | HR 83 | Temp 98.0°F | Resp 18 | Ht 61.0 in | Wt 134.8 lb

## 2016-11-14 DIAGNOSIS — Z8541 Personal history of malignant neoplasm of cervix uteri: Secondary | ICD-10-CM | POA: Insufficient documentation

## 2016-11-14 DIAGNOSIS — M810 Age-related osteoporosis without current pathological fracture: Secondary | ICD-10-CM | POA: Insufficient documentation

## 2016-11-14 DIAGNOSIS — Z8249 Family history of ischemic heart disease and other diseases of the circulatory system: Secondary | ICD-10-CM | POA: Insufficient documentation

## 2016-11-14 DIAGNOSIS — I10 Essential (primary) hypertension: Secondary | ICD-10-CM | POA: Insufficient documentation

## 2016-11-14 DIAGNOSIS — Z0000 Encounter for general adult medical examination without abnormal findings: Secondary | ICD-10-CM | POA: Insufficient documentation

## 2016-11-14 DIAGNOSIS — E785 Hyperlipidemia, unspecified: Secondary | ICD-10-CM | POA: Insufficient documentation

## 2016-11-14 DIAGNOSIS — H547 Unspecified visual loss: Secondary | ICD-10-CM

## 2016-11-14 DIAGNOSIS — F0390 Unspecified dementia without behavioral disturbance: Secondary | ICD-10-CM | POA: Insufficient documentation

## 2016-11-14 MED ORDER — ALENDRONATE SODIUM 70 MG PO TABS: ORAL_TABLET | ORAL | 0 refills | Status: DC

## 2016-11-14 MED FILL — ALENDRONATE NA 70 MG TAB: 70 | 84 days supply | Qty: 12 | Fill #0

## 2016-11-14 NOTE — Progress Notes (Signed)
Patient is here for physical  Patient stated that she has a hysterectomy wants to know if there will be a f/up for that she had her ovaries & uterus remove

## 2016-11-14 NOTE — Progress Notes (Signed)
Subjective:   Patient ID: Kara Dennis, female    DOB: 07/18/1940, 76 y.o.   MRN: [id number]  Chief Complaint  Patient presents with  . Annual Exam   HPI Kara Dennis 76 y.o. female presents for comprehensive physical examination. She is accompanied by her daughter who interprets for her. FH of HTN-brothers, denies any FH of DM, cancer. Daughter reports patient has history of uterine cancer diagnosed 4 years ago. Her daughter reports patient was told to follow up amount, she reports seeing her gynecologist 9 months ago. She reports history of hysterectomy.  History of dementia. She reports upcoming neurologist follow up in Jan. 2019. She is currently taking namenda and aricept for symptoms. Primary caregiver for patient is her daughter. The family and the patient identify problems with changes in short and long term memory. Recent history of neurology appointment in June. Patient started on namenda and aricept, her family daughter monitors medication usage. Daughter reports improved mood improvement and less agitation. Patient is independent with  Ambulation. She requires assistance with the following: bathing and hygiene, feeding, grooming, toileting and dressing. PMH of HTN, prediabetes..She does not check BP at home. Cardiac symptoms none. Patient denies chest pain, chest pressure/discomfort, claudication, dyspnea, lower extremity edema, near-syncope, palpitations and syncope.  Cardiovascular risk factors: advanced age (older than 68 for men, 43 for women), dyslipidemia, hypertension and sedentary lifestyle. Use of agents associated with hypertension: none. History of target organ damage: none. Daughter reports her mother has difficulty reading print. Symptoms began several years ago. Denies symptoms of all other pertinent systems.    Past Medical History:  Diagnosis Date  . Bradycardia 2014    identified before hysterectomy, treated for short time with medication before during and after  hysterectomy   . Cervical cancer ([place]) 2014    s/p hysterectomy, no chemo or radiation   . Dementia   . HTN (hypertension) 2013   took some medicine in [place]     Past Surgical History:  Procedure Laterality Date  . ABDOMINAL HYSTERECTOMY  05/2012     Family History  Problem Relation Age of Onset  . Diabetes Brother   . Heart disease Neg Hx   . Cancer Neg Hx   . Dementia Neg Hx   . Breast cancer Neg Hx     Social History   Social History  . Marital status: Widowed    Spouse name: N/A  . Number of children: 6   . Years of education: 3   Occupational History  . Homemaker     Social History Main Topics  . Smoking status: Never Smoker  . Smokeless tobacco: Never Used  . Alcohol use No  . Drug use: No  . Sexual activity: No   Other Topics Concern  . Not on file   Social History Narrative   Lives with daughter, [place], [place].    Moved from [place] in 05/2013.   Has 6 children total.   4 in [place]   1 in [place]   1 in [place]       Has many grandchildren. 17 total.    Right-handed   Caffeine: every other day       Outpatient Medications Prior to Visit  Medication Sig Dispense Refill  . atorvastatin (LIPITOR) 10 MG tablet Take 1 tablet (10 mg total) by mouth daily. 90 tablet 3  . donepezil (ARICEPT) 10 MG tablet Take 1 tablet (10 mg total) by mouth at bedtime. 30 tablet 11  . lisinopril (PRINIVIL,ZESTRIL)  5 MG tablet Take 1 tablet (5 mg total) by mouth daily. 30 tablet 2  . memantine (NAMENDA) 5 MG tablet Take 1 tablet (5 mg total) by mouth 2 (two) times daily. 60 tablet 5   No facility-administered medications prior to visit.     No Known Allergies  Review of Systems  Constitutional: Negative.   HENT: Negative.   Eyes:       Decreased vision  Respiratory: Negative.   Cardiovascular: Negative.   Gastrointestinal: Negative.   Genitourinary: Negative.   Musculoskeletal: Negative.   Skin: Negative.   Neurological: Negative.   Endo/Heme/Allergies:  Negative.   Psychiatric/Behavioral: Negative.        Objective:    Physical Exam  Constitutional: She is oriented to person, place, and time. She appears well-developed and well-nourished.  HENT:  Head: Normocephalic and atraumatic.  Right Ear: External ear normal.  Left Ear: External ear normal.  Nose: Nose normal.  Mouth/Throat: Oropharynx is clear and moist.  Eyes: Pupils are equal, round, and reactive to light. Conjunctivae and EOM are normal.  Neck: Normal range of motion. Neck supple. No JVD present.  Cardiovascular: Normal rate, regular rhythm, normal heart sounds and intact distal pulses.   Pulmonary/Chest: Effort normal and breath sounds normal.  Abdominal: Soft. Bowel sounds are normal. There is no tenderness.  Musculoskeletal: Normal range of motion.  Lymphadenopathy:    She has no cervical adenopathy.  Neurological: She is alert and oriented to person, place, and time. She has normal reflexes.  Skin: Skin is warm and dry.  Psychiatric: She has a normal mood and affect.  Nursing note and vitals reviewed.   BP (!) 145/83 (BP Location: Left Arm, Patient Position: Sitting, Cuff Size: Normal)   Pulse 83   Temp 98 F (36.7 C) (Oral)   Resp 18   Ht 5\' 1"  (1.549 m)   Wt 134 lb 12.8 oz (61.1 kg)   SpO2 98%   BMI 25.47 kg/m  Wt Readings from Last 3 Encounters:  11/14/16 134 lb 12.8 oz (61.1 kg)  10/01/16 136 lb 12.8 oz (62.1 kg)  09/02/16 138 lb 6.4 oz (62.8 kg)    Immunization History  Administered Date(s) Administered  . Influenza,inj,Quad PF,6+ Mos 04/17/2016  . Pneumococcal Conjugate-13 07/01/2016  . Tdap 10/01/2016      Lab Results  Component Value Date   TSH 1.660 05/22/2016   Lab Results  Component Value Date   WBC 7.3 06/04/2016   HGB 14.2 06/04/2016   HCT 41.2 06/04/2016   MCV 89.0 06/04/2016   PLT 249 06/04/2016   Lab Results  Component Value Date   NA 141 07/01/2016   K 4.1 07/01/2016   CO2 21 07/01/2016   GLUCOSE 104 (H) 07/01/2016     BUN 23 07/01/2016   CREATININE 0.90 07/01/2016   BILITOT 0.7 07/01/2016   ALKPHOS 130 (H) 07/01/2016   AST 35 07/01/2016   ALT 25 07/01/2016   PROT 7.2 07/01/2016   ALBUMIN 4.0 07/01/2016   CALCIUM 9.5 07/01/2016   ANIONGAP 12 06/04/2016   Lab Results  Component Value Date   CHOL 142 10/01/2016   CHOL 211 (H) 05/22/2016   CHOL 207 (H) 01/28/2014   Lab Results  Component Value Date   HDL 48 10/01/2016   HDL 45 05/22/2016   HDL 39 (L) 01/28/2014   Lab Results  Component Value Date   LDLCALC 65 10/01/2016   LDLCALC 124 (H) 05/22/2016   LDLCALC 118 (H) 01/28/2014   Lab  Results  Component Value Date   TRIG 143 10/01/2016   TRIG 212 (H) 05/22/2016   TRIG 249 (H) 01/28/2014   Lab Results  Component Value Date   CHOLHDL 3.0 10/01/2016   CHOLHDL 4.7 (H) 05/22/2016   CHOLHDL 5.3 01/28/2014   Lab Results  Component Value Date   HGBA1C 5.5 10/01/2016   HGBA1C 5.8 (H) 05/22/2016       Assessment & Plan:   1. Annual physical exam  - CMP and Liver - CBC with Differential - TSH - Vitamin D, 25-hydroxy - Lipid Panel  2. History of cervical cancer  - Ambulatory referral to Gynecology  3. Decreased visual acuity Visual acuity screen  - Ambulatory referral to Ophthalmology  4. Osteoporosis without current pathological fracture, unspecified osteoporosis type Bone density scan  10/22/16 - alendronate (FOSAMAX) 70 MG tablet; TAKE ONE TABLET BY MOUTH EVERY 7 DAYS. TAKE WITH A FULL GLASS OF WATER ON AN EMPTY STOMACH. MUST REMAIN UPRIGHT AT LEAST 30 MINUTES AFTER TAKING.  Dispense: 12 tablet; Refill: 0   Follow up: Return in about 3 months (around 02/13/2017) for Osteoporosis / HTN/HLD.    Fredia Beets, FNP

## 2016-11-15 LAB — CBC WITH DIFFERENTIAL/PLATELET
BASOS ABS: 0 10*3/uL (ref 0.0–0.2)
Basos: 1 %
EOS (ABSOLUTE): 0.3 10*3/uL (ref 0.0–0.4)
Eos: 5 %
HEMOGLOBIN: 14.4 g/dL (ref 11.1–15.9)
Hematocrit: 43.2 % (ref 34.0–46.6)
IMMATURE GRANS (ABS): 0 10*3/uL (ref 0.0–0.1)
Immature Granulocytes: 0 %
LYMPHS: 28 %
Lymphocytes Absolute: 1.6 10*3/uL (ref 0.7–3.1)
MCH: 30 pg (ref 26.6–33.0)
MCHC: 33.3 g/dL (ref 31.5–35.7)
MCV: 90 fL (ref 79–97)
MONOCYTES: 7 %
Monocytes Absolute: 0.4 10*3/uL (ref 0.1–0.9)
Neutrophils Absolute: 3.5 10*3/uL (ref 1.4–7.0)
Neutrophils: 59 %
PLATELETS: 233 10*3/uL (ref 150–379)
RBC: 4.8 x10E6/uL (ref 3.77–5.28)
RDW: 14.2 % (ref 12.3–15.4)
WBC: 5.8 10*3/uL (ref 3.4–10.8)

## 2016-11-15 LAB — VITAMIN D 25 HYDROXY (VIT D DEFICIENCY, FRACTURES): VIT D 25 HYDROXY: 41.5 ng/mL (ref 30.0–100.0)

## 2016-11-15 LAB — CMP AND LIVER
ALK PHOS: 159 IU/L — AB (ref 39–117)
ALT: 10 IU/L (ref 0–32)
AST: 17 IU/L (ref 0–40)
Albumin: 4 g/dL (ref 3.5–4.8)
BILIRUBIN, DIRECT: 0.13 mg/dL (ref 0.00–0.40)
BUN: 17 mg/dL (ref 8–27)
Bilirubin Total: 0.5 mg/dL (ref 0.0–1.2)
CO2: 23 mmol/L (ref 20–29)
Calcium: 9.8 mg/dL (ref 8.7–10.3)
Chloride: 104 mmol/L (ref 96–106)
Creatinine, Ser: 1.03 mg/dL — ABNORMAL HIGH (ref 0.57–1.00)
GFR calc Af Amer: 61 mL/min/{1.73_m2} (ref >59)
GFR calc non Af Amer: 53 mL/min/{1.73_m2} — ABNORMAL LOW (ref >59)
Glucose: 102 mg/dL — ABNORMAL HIGH (ref 65–99)
Potassium: 4.1 mmol/L (ref 3.5–5.2)
Sodium: 141 mmol/L (ref 134–144)
Total Protein: 7.4 g/dL (ref 6.0–8.5)

## 2016-11-15 LAB — LIPID PANEL
Chol/HDL Ratio: 2.4 ratio (ref 0.0–4.4)
Cholesterol, Total: 136 mg/dL (ref 100–199)
HDL: 56 mg/dL (ref >39)
LDL Calculated: 50 mg/dL (ref 0–99)
TRIGLYCERIDES: 152 mg/dL — AB (ref 0–149)
VLDL Cholesterol Cal: 30 mg/dL (ref 5–40)

## 2016-11-15 LAB — TSH: TSH: 0.964 u[IU]/mL (ref 0.450–4.500)

## 2016-11-21 ENCOUNTER — Encounter: Payer: Self-pay | Admitting: Obstetrics & Gynecology

## 2016-11-26 ENCOUNTER — Telehealth: Payer: Self-pay

## 2016-11-26 NOTE — Telephone Encounter (Signed)
-----   Message from Alfonse Spruce, [place] sent at 11/18/2016  6:00 PM EDT ----- -Kidney function is stable since but has decreased. 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 This can occur from many different factors including aging. -Continue to take your medications for blood pressure, avoid taking NSAID medications, reduce salt intake to 2 to 4 grams/day. -Recommend monitoring again in 6 months.  -Labs that evaluated your blood cells, fluid and electrolyte balance are normal. No signs of anemia, infection, or inflammation present. -Thyroid function normal -Vitamin D level is normal. Triglyceride  levels were elevated. This can increase your risk of heart disease overtime. Take atorvastatin to help lower risk. Start eating a diet low in saturated fat. Limit your intake of fried foods, red meats, and whole milk.

## 2016-11-26 NOTE — Telephone Encounter (Signed)
interpreter name & ID [id number] [facility]   CMA call regarding lab results   Patient  Did not answer & interpreter unable to leave message

## 2016-12-09 MED FILL — ?ATORVASTATIN 10 MG TABLET: 10 | 30 days supply | Qty: 30 | Fill #4

## 2016-12-09 MED FILL — LISINOPRIL 5 MG TAB: 5 | 30 days supply | Qty: 30 | Fill #2

## 2016-12-09 MED FILL — DONEPEZIL HCL 10 MG TABLET: 10 | 30 days supply | Qty: 30 | Fill #4

## 2016-12-19 ENCOUNTER — Encounter: Payer: Self-pay | Admitting: Obstetrics & Gynecology

## 2016-12-19 ENCOUNTER — Ambulatory Visit (INDEPENDENT_AMBULATORY_CARE_PROVIDER_SITE_OTHER): Payer: Self-pay | Admitting: Obstetrics & Gynecology

## 2016-12-19 VITALS — BP 142/76 | HR 83 | Wt 136.7 lb

## 2016-12-19 DIAGNOSIS — Z1151 Encounter for screening for human papillomavirus (HPV): Secondary | ICD-10-CM

## 2016-12-19 DIAGNOSIS — Z8541 Personal history of malignant neoplasm of cervix uteri: Secondary | ICD-10-CM

## 2016-12-19 DIAGNOSIS — Z23 Encounter for immunization: Secondary | ICD-10-CM

## 2016-12-19 DIAGNOSIS — Z124 Encounter for screening for malignant neoplasm of cervix: Secondary | ICD-10-CM

## 2016-12-19 NOTE — Progress Notes (Signed)
Patient ID: Kara Dennis, female   DOB: [birth year], 76 y.o.   MRN: [id number]  Chief Complaint  Patient presents with  . New Patient (Initial Visit)  referred for f/u for cervical cancer history HPI Kara Dennis is a 76 y.o. female.  G6P6, s/p TAH in [place] which was for cervical cancer according to her son who gave history today. She was sent to have a pap test. Her last pap in 2015 was normal, and a pelvic exam by her PCP this year was normal  HPI  Past Medical History:  Diagnosis Date  . Bradycardia 2014    identified before hysterectomy, treated for short time with medication before during and after hysterectomy   . Cervical cancer ([place]) 2014    s/p hysterectomy, no chemo or radiation   . Dementia   . HTN (hypertension) 2013   took some medicine in [place]   No Known Allergies  Past Surgical History:  Procedure Laterality Date  . ABDOMINAL HYSTERECTOMY  05/2012     Family History  Problem Relation Age of Onset  . Diabetes Brother   . Heart disease Neg Hx   . Cancer Neg Hx   . Dementia Neg Hx   . Breast cancer Neg Hx     Social History Social History  Substance Use Topics  . Smoking status: Never Smoker  . Smokeless tobacco: Never Used  . Alcohol use No    No Known Allergies  Current Outpatient Prescriptions  Medication Sig Dispense Refill  . alendronate (FOSAMAX) 70 MG tablet TAKE ONE TABLET BY MOUTH EVERY 7 DAYS. TAKE WITH A FULL GLASS OF WATER ON AN EMPTY STOMACH. MUST REMAIN UPRIGHT AT LEAST 30 MINUTES AFTER TAKING. 12 tablet 0  . atorvastatin (LIPITOR) 10 MG tablet Take 1 tablet (10 mg total) by mouth daily. 90 tablet 3  . donepezil (ARICEPT) 10 MG tablet Take 1 tablet (10 mg total) by mouth at bedtime. 30 tablet 11  . lisinopril (PRINIVIL,ZESTRIL) 5 MG tablet Take 1 tablet (5 mg total) by mouth daily. 30 tablet 2  . memantine (NAMENDA) 5 MG tablet Take 1 tablet (5 mg total) by mouth 2 (two) times daily. 60 tablet 5   No current facility-administered  medications for this visit.     Review of Systems Review of Systems  Respiratory: Negative.   Cardiovascular: Negative.   Gastrointestinal: Negative.   Genitourinary: Negative.   Psychiatric/Behavioral:       Dementia    Blood pressure (!) 142/76, pulse 83, weight 136 lb 11.2 oz (62 kg).  Physical Exam Physical Exam  Constitutional: She appears well-developed. No distress.  Cardiovascular: Normal rate.   Pulmonary/Chest: Effort normal.  Abdominal: Soft. She exhibits mass. She exhibits no distension. There is no tenderness.  Vertical surgical scar  Genitourinary: Vagina normal. No vaginal discharge found.  Genitourinary Comments: Atrophy, no cuff lesion and no mass or tenderness  Skin: Skin is warm and dry.  Vitals reviewed.   Data Reviewed Pap and office note reviewed Discussed history with her son and requested records if available from [place]  Assessment    4.5 years post op for cervical cancer with benign f/u  Dementia Patient Active Problem List   Diagnosis Date Noted  . Prediabetes 06/30/2016  . Neck mass 04/17/2016  . High cholesterol 01/31/2014  . History of cervical cancer 01/28/2014  . Dementia 01/28/2014  . HTN (hypertension)       Plan    Pap sent but if normal recommend no  further repeat testing       Emeterio Reeve 12/19/2016, 2:31 PM

## 2016-12-19 NOTE — Patient Instructions (Signed)
Cervical Dysplasia Cervical dysplasia is a condition in which a woman's cervix cells have abnormal changes. The cervix is the opening of the uterus (womb). It is located between the vagina and the uterus. Cervical dysplasia may be an early sign of cervical cancer. If left untreated, this condition may become more severe and may progress to cervical cancer. Early detection, treatment, and follow-up care are very important. What are the causes? Cervical dysplasia can be caused by a human papillomavirus (HPV) infection. HPV is the most common sexually transmitted infection (STI). HPV is spread from person to person through sexual contact. This includes oral, vaginal, or anal sex. What increases the risk? The following factors may make you more likely to develop this condition:  Having had a sexually transmitted infection (STI), such as herpes, chlamydia or HPV.  Becoming sexually active before age 18.  Having had more than one sexual partner.  Having a sexual partner who has multiple sexual partners.  Not using protection, such as a condom, during sex, especially with new sexual partners.  Having a history of cancer of the vagina or vulva.  Having a weakened body defense (immune) system.  Being the daughter of a woman who took diethylstilbestrol (DES) during pregnancy.  Having a family history of cervical cancer.  Smoking.  Using oral contraceptives, also called birth control pills.  Having had three or more full-term pregnancies.  What are the signs or symptoms? There are usually no symptoms of this condition. If you do have symptoms, they may include:  Abnormal vaginal discharge.  Bleeding between periods or after sex.  Bleeding during menopause.  Pain during sex (dyspareunia).  How is this diagnosed? A test called a Pap test may be done. During this test, cells are taken from the cervix and then examined under a microscope. A test in which tissue is removed from the cervix  (biopsy) may also be done if the Pap test is abnormal or if the cervix looks abnormal. How is this treated? Treatment varies based on the severity of the condition. Treatment may include:  Cryotherapy. During cryotherapy, the abnormal cells are frozen with a steel-tip instrument.  Loop electrosurgical excision procedure (LEEP). This procedure removes abnormal tissue from the cervix.  Surgery to remove abnormal tissue. This is usually done in more advanced cases. Surgical options include: ? A cone biopsy. This is a procedure in which the cervical canal and a portion of the center of the cervix are removed. ? Hysterectomy. This is a surgery in which the uterus and cervix are removed.  Follow these instructions at home:  Take over-the-counter and prescription medicines only as told by your health care provider.  Do not use tampons, have sex, or douche until your health care provider says it is okay.  Keep follow-up visits as told by your health care provider. This is important. Women who have been treated for cervical dysplasia should have regular pelvic exams and Pap tests as told by their health care provider. How is this prevented?  Practice safe sex to help prevent sexually transmitted infections (STI) that may cause this condition.  Have regular Pap tests. Talk with your health care provider about how often you need these tests. Pap tests will help identify cell changes that can lead to cancer. Contact a health care provider if:  You develop genital warts. Get help right away if:  You have a fever.  You have abnormal vaginal discharge.  Your menstrual period is heavier than normal.  You develop bright   red bleeding. This may include blood clots.  You have pain or cramps that get worse, and medicine does not help to relieve your pain.  You feel light-headed and you are unusually weak.  You have fainting spells.  You have pain in the abdomen. Summary  Cervical dysplasia  is a condition in which a woman's cervix cells have abnormal changes.  If left untreated, this condition may become more severe and may progress to cervical cancer.  Early detection, treatment, and follow-up care are very important in managing this condition.  Have regular pelvic exams and Pap tests. Talk with your health care provider about how often you need these tests. Pap tests will help identify cell changes that can lead to cancer. This information is not intended to replace advice given to you by your health care provider. Make sure you discuss any questions you have with your health care provider. Document Released: 02/25/2005 Document Revised: 02/29/2016 Document Reviewed: 02/29/2016 Elsevier Interactive Patient Education  2017 [employer]  

## 2016-12-23 LAB — CYTOLOGY - PAP
Diagnosis: NEGATIVE
HPV (WINDOPATH): NOT DETECTED

## 2017-01-27 ENCOUNTER — Other Ambulatory Visit: Payer: Self-pay | Admitting: Family Medicine

## 2017-01-27 DIAGNOSIS — I10 Essential (primary) hypertension: Secondary | ICD-10-CM

## 2017-02-04 MED FILL — LISINOPRIL 5 MG TAB: 5 | 30 days supply | Qty: 30 | Fill #0

## 2017-02-13 ENCOUNTER — Encounter: Payer: Self-pay | Admitting: Family Medicine

## 2017-02-13 ENCOUNTER — Ambulatory Visit: Payer: Self-pay | Attending: Family Medicine | Admitting: Family Medicine

## 2017-02-13 DIAGNOSIS — E785 Hyperlipidemia, unspecified: Secondary | ICD-10-CM | POA: Insufficient documentation

## 2017-02-13 DIAGNOSIS — Z79899 Other long term (current) drug therapy: Secondary | ICD-10-CM | POA: Insufficient documentation

## 2017-02-13 DIAGNOSIS — F0390 Unspecified dementia without behavioral disturbance: Secondary | ICD-10-CM | POA: Insufficient documentation

## 2017-02-13 DIAGNOSIS — R7303 Prediabetes: Secondary | ICD-10-CM | POA: Insufficient documentation

## 2017-02-13 DIAGNOSIS — M810 Age-related osteoporosis without current pathological fracture: Secondary | ICD-10-CM | POA: Insufficient documentation

## 2017-02-13 DIAGNOSIS — I10 Essential (primary) hypertension: Secondary | ICD-10-CM | POA: Insufficient documentation

## 2017-02-13 MED ORDER — ALENDRONATE SODIUM 70 MG PO TABS: ORAL_TABLET | ORAL | 0 refills | Status: DC

## 2017-02-13 MED ORDER — LISINOPRIL 5 MG PO TABS: 5.0000 mg | ORAL_TABLET | Freq: Every day | ORAL | 2 refills | Status: DC

## 2017-02-13 MED FILL — ALENDRONATE NA 70 MG TAB: 70 | 28 days supply | Qty: 4 | Fill #0

## 2017-02-13 MED FILL — DONEPEZIL HCL 10 MG TABLET: 10 | 30 days supply | Qty: 30 | Fill #5

## 2017-02-13 MED FILL — MEMANTINE HCL 5 MG TABLET: 5 | 30 days supply | Qty: 60 | Fill #3

## 2017-02-13 MED FILL — ?ATORVASTATIN 10 MG TABLET: 10 | 30 days supply | Qty: 30 | Fill #5

## 2017-02-13 NOTE — Progress Notes (Signed)
Patient is here for f/up   Patient been without med for over a week

## 2017-02-13 NOTE — Progress Notes (Signed)
Subjective:  Patient ID: Kara Dennis, female    DOB: [birth year]  Age: 76 y.o. MRN: [id number]  CC: Hypertension   HPI Kara Dennis presents for . She is accompanied by her daughter. Interpreter services used. PMH of HTN, prediabetes, and dementia.She does not check BP at home. She has been without her medication for 1 week. Cardiac symptoms none. Patient denies chest pain, chest pressure/discomfort, claudication, dyspnea, lower extremity edema, near-syncope, palpitations and syncope.  Cardiovascular risk factors: advanced age (older than 2 for men, 32 for women), dyslipidemia, hypertension and sedentary lifestyle.  She reports patient uses lipitor infrequently. Use of agents associated with hypertension: none. History of target organ damage: none. Dementia: Daughter reports adherence with namenda and aricept, her daughter monitors medication usage. She reports upcoming neurologist follow up in January. History of osteoporosis. Daughter reports patient adherence with medication. Denies any side effects.  Outpatient Medications Prior to Visit  Medication Sig Dispense Refill  . atorvastatin (LIPITOR) 10 MG tablet Take 1 tablet (10 mg total) by mouth daily. 90 tablet 3  . donepezil (ARICEPT) 10 MG tablet Take 1 tablet (10 mg total) by mouth at bedtime. 30 tablet 11  . memantine (NAMENDA) 5 MG tablet Take 1 tablet (5 mg total) by mouth 2 (two) times daily. 60 tablet 5  . alendronate (FOSAMAX) 70 MG tablet TAKE ONE TABLET BY MOUTH EVERY 7 DAYS. TAKE WITH A FULL GLASS OF WATER ON AN EMPTY STOMACH. MUST REMAIN UPRIGHT AT LEAST 30 MINUTES AFTER TAKING. 12 tablet 0  . lisinopril (PRINIVIL,ZESTRIL) 5 MG tablet TAKE 1 TABLET BY MOUTH DAILY. 30 tablet 2   No facility-administered medications prior to visit.     ROS Review of Systems  Constitutional: Negative.   Eyes: Negative.   Respiratory: Negative.   Cardiovascular: Negative.   Gastrointestinal: Negative.   Musculoskeletal: Negative.     Neurological:       History of memory loss  Psychiatric/Behavioral: Negative.     Objective:  BP (!) 124/92 (BP Location: Right Arm, Patient Position: Sitting, Cuff Size: Normal)   Pulse 82   Temp 97.9 F (36.6 C) (Oral)   Resp 18   Ht 5' (1.524 m)   Wt 136 lb 9.6 oz (62 kg)   SpO2 96%   BMI 26.68 kg/m   BP/Weight 02/13/2017 41/28/7867 08/15/2092  Systolic BP 709 628 366  Diastolic BP 92 76 83  Wt. (Lbs) 136.6 136.7 134.8  BMI 26.68 25.83 25.47    Physical Exam  Constitutional: She appears well-developed and well-nourished.  Neck: No JVD present.  Cardiovascular: Normal rate, regular rhythm, normal heart sounds and intact distal pulses.  Pulmonary/Chest: Effort normal and breath sounds normal.  Abdominal: Soft. Bowel sounds are normal. There is no tenderness.  Neurological: She is alert.  A/Ox2 : Oriented to person.  Psychiatric: She has a normal mood and affect.  Nursing note and vitals reviewed.  Assessment & Plan:   1. Essential hypertension Schedule BP recheck with nurse in 1 week. Follow up with PCP in 3 months. - lisinopril (PRINIVIL,ZESTRIL) 5 MG tablet; Take 1 tablet (5 mg total) by mouth daily.  Dispense: 30 tablet; Refill: 2 - Lipid Panel - CMP and Liver  2. Osteoporosis without current pathological fracture, unspecified osteoporosis type  - alendronate (FOSAMAX) 70 MG tablet; TAKE ONE TABLET BY MOUTH EVERY 7 DAYS. TAKE WITH A FULL GLASS OF WATER ON AN EMPTY STOMACH. MUST REMAIN UPRIGHT AT LEAST 30 MINUTES AFTER TAKING.  Dispense: 24 tablet;  Refill: 0    Follow-up: Return in about 1 week (around 02/20/2017) for BP check with [place].    Alfonse Spruce FNP

## 2017-02-13 NOTE — Patient Instructions (Signed)
Managing Your Hypertension Hypertension is commonly called high blood pressure. This is when the force of your blood pressing against the walls of your arteries is too strong. Arteries are blood vessels that carry blood from your heart throughout your body. Hypertension forces the heart to work harder to pump blood, and may cause the arteries to become narrow or stiff. Having untreated or uncontrolled hypertension can cause heart attack, stroke, kidney disease, and other problems. What are blood pressure readings? A blood pressure reading consists of a higher number over a lower number. Ideally, your blood pressure should be below 120/80. The first ("top") number is called the systolic pressure. It is a measure of the pressure in your arteries as your heart beats. The second ("bottom") number is called the diastolic pressure. It is a measure of the pressure in your arteries as the heart relaxes. What does my blood pressure reading mean? Blood pressure is classified into four stages. Based on your blood pressure reading, your health care provider may use the following stages to determine what type of treatment you need, if any. Systolic pressure and diastolic pressure are measured in a unit called mm Hg. Normal  Systolic pressure: below 120.  Diastolic pressure: below 80. Elevated  Systolic pressure: 120-129.  Diastolic pressure: below 80. Hypertension stage 1  Systolic pressure: 130-139.  Diastolic pressure: 80-89. Hypertension stage 2  Systolic pressure: 140 or above.  Diastolic pressure: 90 or above. What health risks are associated with hypertension? Managing your hypertension is an important responsibility. Uncontrolled hypertension can lead to:  A heart attack.  A stroke.  A weakened blood vessel (aneurysm).  Heart failure.  Kidney damage.  Eye damage.  Metabolic syndrome.  Memory and concentration problems.  What changes can I make to manage my  hypertension? Hypertension can be managed by making lifestyle changes and possibly by taking medicines. Your health care provider will help you make a plan to bring your blood pressure within a normal range. Eating and drinking  Eat a diet that is high in fiber and potassium, and low in salt (sodium), added sugar, and fat. An example eating plan is called the DASH (Dietary Approaches to Stop Hypertension) diet. To eat this way: ? Eat plenty of fresh fruits and vegetables. Try to fill half of your plate at each meal with fruits and vegetables. ? Eat whole grains, such as whole wheat pasta, brown rice, or whole grain bread. Fill about one quarter of your plate with whole grains. ? Eat low-fat diary products. ? Avoid fatty cuts of meat, processed or cured meats, and poultry with skin. Fill about one quarter of your plate with lean proteins such as fish, chicken without skin, beans, eggs, and tofu. ? Avoid premade and processed foods. These tend to be higher in sodium, added sugar, and fat.  Reduce your daily sodium intake. Most people with hypertension should eat less than 1,500 mg of sodium a day.  Limit alcohol intake to no more than 1 drink a day for nonpregnant women and 2 drinks a day for men. One drink equals 12 oz of beer, 5 oz of wine, or 1 oz of hard liquor. Lifestyle  Work with your health care provider to maintain a healthy body weight, or to lose weight. Ask what an ideal weight is for you.  Get at least 30 minutes of exercise that causes your heart to beat faster (aerobic exercise) most days of the week. Activities may include walking, swimming, or biking.  Include exercise   to strengthen your muscles (resistance exercise), such as weight lifting, as part of your weekly exercise routine. Try to do these types of exercises for 30 minutes at least 3 days a week.  Do not use any products that contain nicotine or tobacco, such as cigarettes and e-cigarettes. If you need help quitting, ask  your health care provider.  Control any long-term (chronic) conditions you have, such as high cholesterol or diabetes. Monitoring  Monitor your blood pressure at home as told by your health care provider. Your personal target blood pressure may vary depending on your medical conditions, your age, and other factors.  Have your blood pressure checked regularly, as often as told by your health care provider. Working with your health care provider  Review all the medicines you take with your health care provider because there may be side effects or interactions.  Talk with your health care provider about your diet, exercise habits, and other lifestyle factors that may be contributing to hypertension.  Visit your health care provider regularly. Your health care provider can help you create and adjust your plan for managing hypertension. Will I need medicine to control my blood pressure? Your health care provider may prescribe medicine if lifestyle changes are not enough to get your blood pressure under control, and if:  Your systolic blood pressure is 130 or higher.  Your diastolic blood pressure is 80 or higher.  Take medicines only as told by your health care provider. Follow the directions carefully. Blood pressure medicines must be taken as prescribed. The medicine does not work as well when you skip doses. Skipping doses also puts you at risk for problems. Contact a health care provider if:  You think you are having a reaction to medicines you have taken.  You have repeated (recurrent) headaches.  You feel dizzy.  You have swelling in your ankles.  You have trouble with your vision. Get help right away if:  You develop a severe headache or confusion.  You have unusual weakness or numbness, or you feel faint.  You have severe pain in your chest or abdomen.  You vomit repeatedly.  You have trouble breathing. Summary  Hypertension is when the force of blood pumping through  your arteries is too strong. If this condition is not controlled, it may put you at risk for serious complications.  Your personal target blood pressure may vary depending on your medical conditions, your age, and other factors. For most people, a normal blood pressure is less than 120/80.  Hypertension is managed by lifestyle changes, medicines, or both. Lifestyle changes include weight loss, eating a healthy, low-sodium diet, exercising more, and limiting alcohol. This information is not intended to replace advice given to you by your health care provider. Make sure you discuss any questions you have with your health care provider. Document Released: 11/20/2011 Document Revised: 01/24/2016 Document Reviewed: 01/24/2016 Elsevier Interactive Patient Education  2018 Elsevier Inc.  

## 2017-02-14 LAB — CMP AND LIVER
ALBUMIN: 4.2 g/dL (ref 3.5–4.8)
ALK PHOS: 104 IU/L (ref 39–117)
ALT: 10 IU/L (ref 0–32)
AST: 19 IU/L (ref 0–40)
BUN: 19 mg/dL (ref 8–27)
Bilirubin Total: 0.4 mg/dL (ref 0.0–1.2)
Bilirubin, Direct: 0.13 mg/dL (ref 0.00–0.40)
CO2: 19 mmol/L — ABNORMAL LOW (ref 20–29)
CREATININE: 0.82 mg/dL (ref 0.57–1.00)
Calcium: 8.9 mg/dL (ref 8.7–10.3)
Chloride: 108 mmol/L — ABNORMAL HIGH (ref 96–106)
GFR calc Af Amer: 80 mL/min/{1.73_m2} (ref >59)
GFR, EST NON AFRICAN AMERICAN: 70 mL/min/{1.73_m2} (ref >59)
GLUCOSE: 106 mg/dL — AB (ref 65–99)
Potassium: 4.4 mmol/L (ref 3.5–5.2)
Sodium: 144 mmol/L (ref 134–144)
Total Protein: 7.2 g/dL (ref 6.0–8.5)

## 2017-02-14 LAB — LIPID PANEL
CHOL/HDL RATIO: 2.6 ratio (ref 0.0–4.4)
Cholesterol, Total: 128 mg/dL (ref 100–199)
HDL: 49 mg/dL (ref >39)
LDL CALC: 55 mg/dL (ref 0–99)
TRIGLYCERIDES: 120 mg/dL (ref 0–149)
VLDL CHOLESTEROL CAL: 24 mg/dL (ref 5–40)

## 2017-03-07 ENCOUNTER — Telehealth: Payer: Self-pay | Admitting: *Deleted

## 2017-03-07 NOTE — Telephone Encounter (Signed)
Patient aware of results. Assistance provided by Waymond Cera.

## 2017-03-14 MED FILL — ?ATORVASTATIN 10 MG TABLET: 10 | 30 days supply | Qty: 30 | Fill #6

## 2017-03-14 MED FILL — LISINOPRIL 5 MG TAB: 5 | 30 days supply | Qty: 30 | Fill #1

## 2017-03-17 ENCOUNTER — Ambulatory Visit (INDEPENDENT_AMBULATORY_CARE_PROVIDER_SITE_OTHER): Payer: Self-pay | Admitting: Neurology

## 2017-03-17 ENCOUNTER — Encounter (INDEPENDENT_AMBULATORY_CARE_PROVIDER_SITE_OTHER): Payer: Self-pay

## 2017-03-17 ENCOUNTER — Encounter: Payer: Self-pay | Admitting: Neurology

## 2017-03-17 VITALS — BP 132/76 | HR 75 | Ht 61.0 in | Wt 137.2 lb

## 2017-03-17 DIAGNOSIS — F0280 Dementia in other diseases classified elsewhere without behavioral disturbance: Secondary | ICD-10-CM

## 2017-03-17 DIAGNOSIS — G301 Alzheimer's disease with late onset: Secondary | ICD-10-CM

## 2017-03-17 MED ORDER — MEMANTINE HCL 10 MG PO TABS: 10.0000 mg | ORAL_TABLET | Freq: Two times a day (BID) | ORAL | 11 refills | Status: DC

## 2017-03-17 MED FILL — MEMANTINE HCL 10 MG TABLET: 10 | 30 days supply | Qty: 60 | Fill #0

## 2017-03-17 NOTE — Progress Notes (Signed)
Kara Dennis    Provider:  Dr Jaynee Eagles Referring Provider: Shawnee Knapp, MD Primary Care Physician:  Kara Spruce, FNP  CC:  Memory loss  03/17/2017:  Patient is here for dementia. She is on Aricept 10mg  a day and Namenda 5mg  a day. Here with interpreter. Also here with daughter who provides most information. Patient says she is well. Still living with daughter and alternates with brother, most of the time she is with someone all day. Memory stable. Will increase Namenda to 10mg  a day. Then at next appointment may increase Aricept. No falls. She is sleeping well. No mood or behavioral problems. She spends time with family, she walks around the house. She does not walk alone outside. No problems swallowing or eating. She is taking a multivitamin with folate every day.   Reviewed images of the brain and agree with the following:   Abnormal MRI brain (without) demonstrating: 1. 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 2. Mild periventricular and subcortical chronic small vessel ischemic disease.  3. No acute findings. 4. Incidentally noted is extensive mucus / fluid in the frontal, ethmoid and sphenoid sinuses. Correlated clinically for sinusitis.  09/02/16: Kara Dennis is a 77 year old female with a history of memory loss. She is here today with her daughter and an interpreter. Her daughter reports that she feels that her memory has gotten a little better. She still notices some changes with her short-term memory. The patient lives with her daughter. She does require assistance with ADLs. She is not operating a motor vehicle. She does not prepare any meals. Her daughter handles all of her finances. Daughter denies any changes in mood or behavior. Denies hallucinations. Reports that the patient is sleeping well. They deny any new neurological symptoms. She returns today for an evaluation.    HPI:  Kara Dennis is a 77 y.o. female here as a  referral from Dr. Adrian Blackwater for memory loss. Patient is here with family and interpreter. Patient says she does not have memory loss. Both daughter and patient use interpreter. The memory loss is severe. Memory changes started in 2014. Daughter provides all information vis interpreter. She became more confused in 2015. She doesn't know how many children she has. But she recognizes her children. She doesn't know the date. She doesn't know what time of the day it is. She lives with daughter. Slowly progressive. She can't differentiate between tea and soy sauce. She dresses on her own. Daughter helps with bathing. Daughter brings her food and eats independently. She can't use the phone anymore. She confuses the remote with the phone. No FHx of dementia. No hallucinations or delusions. She is often afraid of thiefs and burglers and wants to lock the doors. No falls, no swallowing dififculties. No FHx of dementia. Nomemory loss in brothers and sisters. No other focal neurologic deficits, associated symptoms, inciting events or modifiable factors.  Review of Systems: Patient complains of symptoms per HPI as well as the following symptoms: weight loss, . Pertinent negatives per HPI. All others negative   Social History   Socioeconomic History  . Marital status: Widowed    Spouse name: Not on file  . Number of children: 6   . Years of education: 3  . Highest education level: Not on file  Social Needs  . Financial resource strain: Not on file  . Food insecurity - worry: Not on file  . Food insecurity - inability: Not on file  .  Transportation needs - medical: Not on file  . Transportation needs - non-medical: Not on file  Occupational History  . Occupation: Homemaker   Tobacco Use  . Smoking status: Never Smoker  . Smokeless tobacco: Never Used  Substance and Sexual Activity  . Alcohol use: No    Alcohol/week: 0.0 oz  . Drug use: No  . Sexual activity: No    Birth control/protection: None  Other  Topics Concern  . Not on file  Social History Narrative   Lives with daughter, [place], [place].    Moved from [place] in 05/2013.   Has 6 children total.   4 in [place]   1 in [place]   1 in [place]       Has many grandchildren. 17 total.    Right-handed   Caffeine: 1 cup every other day    Family History  Problem Relation Age of Onset  . Diabetes Brother   . Heart disease Neg Hx   . Cancer Neg Hx   . Dementia Neg Hx   . Breast cancer Neg Hx     Past Medical History:  Diagnosis Date  . Bradycardia 2014    identified before hysterectomy, treated for short time with medication before during and after hysterectomy   . Cervical cancer ([place]) 2014    s/p hysterectomy, no chemo or radiation   . Dementia   . HTN (hypertension) 2013   took some medicine in [place]     Past Surgical History:  Procedure Laterality Date  . ABDOMINAL HYSTERECTOMY  05/2012     Current Outpatient Medications  Medication Sig Dispense Refill  . alendronate (FOSAMAX) 70 MG tablet TAKE ONE TABLET BY MOUTH EVERY 7 DAYS. TAKE WITH A FULL GLASS OF WATER ON AN EMPTY STOMACH. MUST REMAIN UPRIGHT AT LEAST 30 MINUTES AFTER TAKING. 24 tablet 0  . atorvastatin (LIPITOR) 10 MG tablet Take 1 tablet (10 mg total) by mouth daily. 90 tablet 3  . donepezil (ARICEPT) 10 MG tablet Take 1 tablet (10 mg total) by mouth at bedtime. 30 tablet 11  . lisinopril (PRINIVIL,ZESTRIL) 5 MG tablet Take 1 tablet (5 mg total) by mouth daily. 30 tablet 2  . memantine (NAMENDA) 10 MG tablet Take 1 tablet (10 mg total) by mouth 2 (two) times daily. 60 tablet 11   No current facility-administered medications for this visit.     Allergies as of 03/17/2017  . (No Known Allergies)    Vitals: BP 132/76 (BP Location: Right Arm, Patient Position: Sitting)   Pulse 75   Ht 5\' 1"  (1.549 m)   Wt 137 lb 3.2 oz (62.2 kg)   BMI 25.92 kg/m  Last Weight:  Wt Readings from Last 1 Encounters:  03/17/17 137 lb 3.2 oz (62.2 kg)   Last Height:     Ht Readings from Last 1 Encounters:  03/17/17 5\' 1"  (1.549 m)    MMSE - Mini Mental State Exam 09/02/2016 05/30/2016 02/16/2014  Orientation to time 0 0 2  Orientation to time comments - - did not know year, date of day of the month   Orientation to Place 0 1 3  Orientation to Place-comments - - did not know state or city   Registration 3 3 3   Attention/ Calculation 5 1 5   Recall 0 0 3  Language- name 2 objects 2 2 2   Language- repeat 1 0 1  Language- follow 3 step command 2 3 3   Language- read & follow direction 1 1 1  Write a sentence 0 1 1  Copy design 1 1 1   Total score 15 13 25        Assessment/Plan:  Assessment/Plan:  77 year old female with dementia. MMSE 13 out of 30. Discussed dementia with daughter, different types, things we are evaluating for, and risk of dementia due to FHx. She is stable, will increase Namenda to 10mg  twice daily  MRI brain Labs today Follow up in 6 months with Ward Givens At next appointment may consider increasing Aricept to 10mg  twice daily   Sarina Ill, MD  [facility] [address] [place], [place] [address]  Phone [phone] Fax [phone]  A total of 25 minutes was spent face-to-face with this patient. Over half this time was spent on counseling patient on the dementia diagnosis and different diagnostic and therapeutic options available.

## 2017-03-17 NOTE — Patient Instructions (Signed)
Increase Namenda(memantine) to 10mg  twice daily Followup in 6 months   Alzheimer Disease Caregiver Guide A person who has Alzheimer disease may not be able to take care of himself or herself. He or she may need help with simple tasks. The tips below can help you care for the person. Memory loss and confusion If the person is confused or cannot remember things:  Stay calm.  Respond with a short answer.  Avoid correcting him or her in a way that sounds like scolding.  Try not to take it personally, even if he or she forgets your name.  Behavior changes The person may go through behavior changes. This can include depression, anxiety, anger, or seeing things that are not there. When behavior changes:  Try not to take behavior changes personally.  Stay calm and patient.  Do not argue or try to convince the person about a specific point.  Know that these changes are part of the disease process. Try to work through it.  Tips to lessen frustration  Make appointments and do daily tasks when the person is at his or her best.  Take your time. Simple tasks may take longer. Allow plenty of time to complete tasks.  Limit choices for the person.  Involve the person in what you are doing.  Stick to a routine.  Avoid new or crowded places, if possible.  Use simple words, short sentences, and a calm voice. Only give 1 direction at a time.  Buy clothes and shoes that are easy to put on and take off.  Let people help if they offer. Home safety  Keep floors clear. Remove rugs, magazine racks, and floor lamps.  Keep hallways well lit.  Put a handrail and nonslip mat in the bathtub or shower.  Put childproof locks on cabinets that have dangerous items in them. These items include medicine, alcohol, guns, toxic cleaning items, sharp tools, matches, or lighters.  Place locks on doors where the person cannot see or reach them. This helps the person to not wander out of the house and  get lost.  Be prepared for emergencies. Keep a list of emergency phone numbers and addresses in a handy area. Plans for the future  Talk about finances. ? Talk about money management. People with Alzheimer disease have trouble managing their money as the disease gets worse. ? Get help from professional advisors about financial and legal matters.  Talk about future care. ? Choose a power of attorney. This is someone who can make decisions for the person with Alzheimer disease when he or she can no longer do so. ? Talk about driving and when it is the right time to stop. The person's doctor can help with this. ? If the person lives alone, make sure he or she is safe. Some people need extra help at home. Other people need more care at a nursing home or care center. Support groups Some benefits of joining a support group include:  Learning ways to manage stress.  Sharing experiences with others.  Getting emotional comfort and support.  Learning new caregiving skills as the disease progresses.  Knowing what community resources are available and taking advantage of them.  Get help if:  The person has a fever.  The person has a sudden behavior change that does not get better with calming strategies.  The person is unable to manage his or her living situation.  The person threatens you or anyone else, including himself or herself.  You are  no longer able to care for the person. This information is not intended to replace advice given to you by your health care provider. Make sure you discuss any questions you have with your health care provider. Document Released: 05/20/2011 Document Revised: 08/03/2015 Document Reviewed: 04/17/2011 Elsevier Interactive Patient Education  2017 [employer].

## 2017-03-31 ENCOUNTER — Encounter: Payer: Self-pay | Admitting: Family Medicine

## 2017-03-31 ENCOUNTER — Ambulatory Visit: Payer: Self-pay | Attending: Family Medicine | Admitting: Family Medicine

## 2017-03-31 VITALS — BP 130/85 | HR 80 | Temp 97.3°F | Resp 18 | Ht 62.0 in | Wt 140.0 lb

## 2017-03-31 DIAGNOSIS — J3489 Other specified disorders of nose and nasal sinuses: Secondary | ICD-10-CM

## 2017-03-31 DIAGNOSIS — J40 Bronchitis, not specified as acute or chronic: Secondary | ICD-10-CM

## 2017-03-31 DIAGNOSIS — Z79899 Other long term (current) drug therapy: Secondary | ICD-10-CM | POA: Insufficient documentation

## 2017-03-31 DIAGNOSIS — Z87898 Personal history of other specified conditions: Secondary | ICD-10-CM

## 2017-03-31 DIAGNOSIS — R7303 Prediabetes: Secondary | ICD-10-CM | POA: Insufficient documentation

## 2017-03-31 LAB — POCT GLYCOSYLATED HEMOGLOBIN (HGB A1C): HEMOGLOBIN A1C: 5.5

## 2017-03-31 MED ORDER — FLUTICASONE PROPIONATE 50 MCG/ACT NA SUSP: 2.0000 | Freq: Every day | NASAL | 0 refills | Status: DC | PRN

## 2017-03-31 MED ORDER — GUAIFENESIN-DM 100-10 MG/5ML PO SYRP: 5.0000 mL | ORAL_SOLUTION | ORAL | 0 refills | Status: DC | PRN

## 2017-03-31 MED FILL — FLUTICASONE PROP 50 MCG SPR: 50 | 30 days supply | Qty: 16 | Fill #0

## 2017-03-31 MED FILL — ROBAFEN-DM SYRUP: 100-10 | 8 days supply | Qty: 236 | Fill #0

## 2017-03-31 NOTE — Progress Notes (Signed)
Subjective:  Patient ID: Kara Dennis, female    DOB: 25-Nov-1940  Age: 77 y.o. MRN: [id number]  CC: Cough   HPI Kara Dennis presents for she is accompanied by her nephew and her son. Interpreter services used. # Mai [phone]   Cough Onset 1 week ago. Productive cough with sputum. Symptoms include sneezing. Denies any fevers, chills, fatigue, sore throat, or hemoptysis.   No sick contacts. Non-smoker. Cough occurs regardless of po intake. She denies any dysphagia.     Outpatient Medications Prior to Visit  Medication Sig Dispense Refill  . alendronate (FOSAMAX) 70 MG tablet TAKE ONE TABLET BY MOUTH EVERY 7 DAYS. TAKE WITH A FULL GLASS OF WATER ON AN EMPTY STOMACH. MUST REMAIN UPRIGHT AT LEAST 30 MINUTES AFTER TAKING. 24 tablet 0  . atorvastatin (LIPITOR) 10 MG tablet Take 1 tablet (10 mg total) by mouth daily. 90 tablet 3  . donepezil (ARICEPT) 10 MG tablet Take 1 tablet (10 mg total) by mouth at bedtime. 30 tablet 11  . lisinopril (PRINIVIL,ZESTRIL) 5 MG tablet Take 1 tablet (5 mg total) by mouth daily. 30 tablet 2  . memantine (NAMENDA) 10 MG tablet Take 1 tablet (10 mg total) by mouth 2 (two) times daily. 60 tablet 11   No facility-administered medications prior to visit.     ROS Review of Systems  Constitutional: Negative for fatigue and fever.  HENT: Positive for sneezing. Negative for ear pain, sinus pain and sore throat.   Eyes: Negative.   Respiratory: Positive for cough. Negative for shortness of breath.   Cardiovascular: Negative.      Objective:  BP 130/85 (BP Location: Right Arm, Patient Position: Sitting, Cuff Size: Normal)   Pulse 80   Temp (!) 97.3 F (36.3 C) (Oral)   Resp 18   Ht 5\' 2"  (1.575 m)   Wt 140 lb (63.5 kg)   SpO2 99%   BMI 25.61 kg/m   BP/Weight 03/31/2017 03/17/2017 47/06/2593  Systolic BP 638 756 433  Diastolic BP 85 76 92  Wt. (Lbs) 140 137.2 136.6  BMI 25.61 25.92 26.68     Physical Exam  Constitutional: She appears well-developed  and well-nourished.  HENT:  Head: Normocephalic and atraumatic.  Right Ear: External ear normal. No swelling. No decreased hearing is noted.  Left Ear: No swelling. No decreased hearing is noted.  Nose: Rhinorrhea (clear) present.  Mouth/Throat: Oropharynx is clear and moist.  Eyes: Conjunctivae are normal. Pupils are equal, round, and reactive to light.  Neck: Normal range of motion. Neck supple.  Cardiovascular: Normal rate, regular rhythm, normal heart sounds and intact distal pulses.  Pulmonary/Chest: Effort normal and breath sounds normal. She has no wheezes.  Lymphadenopathy:    She has no cervical adenopathy.  Skin: Skin is warm and dry.  Psychiatric: She has a normal mood and affect.  Nursing note and vitals reviewed.    Assessment & Plan:   1. Bronchitis Return precautions given. - Respiratory virus panel - guaiFENesin-dextromethorphan (ROBITUSSIN DM) 100-10 MG/5ML syrup; Take 5 mLs by mouth every 4 (four) hours as needed for cough.  Dispense: 236 mL; Refill: 0  2. Rhinorrhea  - fluticasone (FLONASE) 50 MCG/ACT nasal spray; Place 2 sprays into both nostrils daily as needed.  Dispense: 16 g; Refill: 0  3. History of prediabetes Hgba1c 5.5 in office today. No longer prediabetic. - HgB A1c   Follow-up: Return in about 2 weeks (around 04/14/2017), or if symptoms worsen or fail to improve, for Cough.  Alfonse Spruce FNP

## 2017-03-31 NOTE — Patient Instructions (Addendum)
Upper Respiratory Infection, Adult Most upper respiratory infections (URIs) are caused by a virus. A URI affects the nose, throat, and upper air passages. The most common type of URI is often called "the common cold." Follow these instructions at home:  Take medicines only as told by your doctor.  Gargle warm saltwater or take cough drops to comfort your throat as told by your doctor.  Use a warm mist humidifier or inhale steam from a shower to increase air moisture. This may make it easier to breathe.  Drink enough fluid to keep your pee (urine) clear or pale yellow.  Eat soups and other clear broths.  Have a healthy diet.  Rest as needed.  Go back to work when your fever is gone or your doctor says it is okay. ? You may need to stay home longer to avoid giving your URI to others. ? You can also wear a face mask and wash your hands often to prevent spread of the virus.  Use your inhaler more if you have asthma.  Do not use any tobacco products, including cigarettes, chewing tobacco, or electronic cigarettes. If you need help quitting, ask your doctor. Contact a doctor if:  You are getting worse, not better.  Your symptoms are not helped by medicine.  You have chills.  You are getting more short of breath.  You have brown or red mucus.  You have yellow or brown discharge from your nose.  You have pain in your face, especially when you bend forward.  You have a fever.  You have puffy (swollen) neck glands.  You have pain while swallowing.  You have white areas in the back of your throat. Get help right away if:  You have very bad or constant: ? Headache. ? Ear pain. ? Pain in your forehead, behind your eyes, and over your cheekbones (sinus pain). ? Chest pain.  You have long-lasting (chronic) lung disease and any of the following: ? Wheezing. ? Long-lasting cough. ? Coughing up blood. ? A change in your usual mucus.  You have a stiff neck.  You have  changes in your: ? Vision. ? Hearing. ? Thinking. ? Mood. This information is not intended to replace advice given to you by your health care provider. Make sure you discuss any questions you have with your health care provider. Document Released: 08/14/2007 Document Revised: 10/29/2015 Document Reviewed: 06/02/2013 Elsevier Interactive Patient Education  2018 Elsevier Inc.  

## 2017-04-02 LAB — RESPIRATORY VIRUS PANEL
Adenovirus: NEGATIVE
Influenza A: NEGATIVE
Influenza B: NEGATIVE
Metapneumovirus: NEGATIVE
PARAINFLUENZA 1 A: NEGATIVE
PARAINFLUENZA 3 A: NEGATIVE
Parainfluenza 2: NEGATIVE
RESPIRATORY SYNCYTIAL VIRUS B: NEGATIVE
RHINOVIRUS: NEGATIVE
Respiratory Syncytial Virus A: NEGATIVE

## 2017-04-10 ENCOUNTER — Telehealth (INDEPENDENT_AMBULATORY_CARE_PROVIDER_SITE_OTHER): Payer: Self-pay | Admitting: *Deleted

## 2017-04-10 NOTE — Telephone Encounter (Signed)
-----   Message from Alfonse Spruce, [employer] sent at 04/02/2017  3:19 PM EST ----- RSV was negative for some of the  most common respiratory virus including influenza.  Continue to take medications as prescribed, increase fluid intake, rest, and handwashing.

## 2017-04-10 NOTE — Telephone Encounter (Signed)
Medical Assistant used [employer] to contact patient.  Interpreter Name: Margo Aye #: [id number] Patient verified DOB Patient is aware of RSV being negative including the flu. Patient should continue with prescribed medications and increase fluid, rest and practice handwashing. No further questions.

## 2017-04-23 ENCOUNTER — Ambulatory Visit: Payer: Self-pay | Attending: Family Medicine

## 2017-04-25 MED FILL — DONEPEZIL HCL 10 MG TABLET: 10 | 30 days supply | Qty: 30 | Fill #6

## 2017-04-25 MED FILL — ?ATORVASTATIN 10 MG TABLET: 10 | 30 days supply | Qty: 30 | Fill #7

## 2017-04-25 MED FILL — LISINOPRIL 5 MG TABLET: 5 | 30 days supply | Qty: 30 | Fill #2

## 2017-04-28 ENCOUNTER — Encounter: Payer: Self-pay | Admitting: Family Medicine

## 2017-04-28 ENCOUNTER — Ambulatory Visit: Payer: Self-pay | Attending: Family Medicine | Admitting: Family Medicine

## 2017-04-28 ENCOUNTER — Other Ambulatory Visit: Payer: Self-pay

## 2017-04-28 VITALS — BP 138/82 | HR 79 | Temp 98.9°F | Resp 16 | Ht 61.0 in | Wt 137.2 lb

## 2017-04-28 DIAGNOSIS — R05 Cough: Secondary | ICD-10-CM | POA: Insufficient documentation

## 2017-04-28 DIAGNOSIS — Z760 Encounter for issue of repeat prescription: Secondary | ICD-10-CM

## 2017-04-28 DIAGNOSIS — H6123 Impacted cerumen, bilateral: Secondary | ICD-10-CM

## 2017-04-28 DIAGNOSIS — R053 Chronic cough: Secondary | ICD-10-CM

## 2017-04-28 DIAGNOSIS — M810 Age-related osteoporosis without current pathological fracture: Secondary | ICD-10-CM

## 2017-04-28 DIAGNOSIS — Z79899 Other long term (current) drug therapy: Secondary | ICD-10-CM | POA: Insufficient documentation

## 2017-04-28 DIAGNOSIS — J069 Acute upper respiratory infection, unspecified: Secondary | ICD-10-CM

## 2017-04-28 DIAGNOSIS — I10 Essential (primary) hypertension: Secondary | ICD-10-CM

## 2017-04-28 MED ORDER — ALENDRONATE SODIUM 70 MG PO TABS: ORAL_TABLET | ORAL | 0 refills | Status: DC

## 2017-04-28 MED ORDER — CARBAMIDE PEROXIDE 6.5 % OT SOLN: 10.0000 [drp] | Freq: Two times a day (BID) | OTIC | 0 refills | Status: DC

## 2017-04-28 MED ORDER — FLUTICASONE PROPIONATE 50 MCG/ACT NA SUSP: 2.0000 | Freq: Every day | NASAL | 6 refills | Status: DC

## 2017-04-28 MED ORDER — ATORVASTATIN CALCIUM 10 MG PO TABS: 10.0000 mg | ORAL_TABLET | Freq: Every day | ORAL | 2 refills | Status: DC

## 2017-04-28 MED ORDER — LISINOPRIL 5 MG PO TABS: 5.0000 mg | ORAL_TABLET | Freq: Every day | ORAL | 2 refills | Status: DC

## 2017-04-28 MED ORDER — AZITHROMYCIN 250 MG PO TABS: ORAL_TABLET | ORAL | 0 refills | Status: DC

## 2017-04-28 MED ORDER — GUAIFENESIN-DM 100-10 MG/5ML PO SYRP: 5.0000 mL | ORAL_SOLUTION | ORAL | 0 refills | Status: DC | PRN

## 2017-04-28 MED FILL — ?ALENDRONATE NA 70MG TA: 70 | 84 days supply | Qty: 12 | Fill #0

## 2017-04-28 MED FILL — ROBAFEN-DM SYRUP: 100-10 | 7 days supply | Qty: 236 | Fill #0

## 2017-04-28 MED FILL — AZITHROMYCIN 250 MG TABLET: 250 | 5 days supply | Qty: 6 | Fill #0

## 2017-04-28 MED FILL — FLUTICASONE PROP 50 MCG SPR: 50 | 30 days supply | Qty: 16 | Fill #0

## 2017-04-28 NOTE — Progress Notes (Signed)
Runny and itchy nose Productive Cough  Tired Needs medication RF. Going [place] the [place].

## 2017-04-28 NOTE — Progress Notes (Signed)
Subjective:  Patient ID: Kara Dennis, female    DOB: Feb 15, 1941  Age: 77 y.o. MRN: [id number]  CC: URI   HPI Kara Dennis presents for she is accompanied by her son. Interpreter services used. C/o cough. Onset more than a month ago. Productive cough with white, thick sputum. Symptoms include fatigue, nasal congestion and rhinorrhea. Denies any fevers, chills, fatigue, sore throat, or hemoptysis. No sick contacts. Non-smoker. Cough occurs regardless of po intake. She denies any dysphagia. Her son reports patient is drinking plenty of fluids.       Outpatient Medications Prior to Visit  Medication Sig Dispense Refill  . donepezil (ARICEPT) 10 MG tablet Take 1 tablet (10 mg total) by mouth at bedtime. 30 tablet 11  . fluticasone (FLONASE) 50 MCG/ACT nasal spray Place 2 sprays into both nostrils daily as needed. 16 g 0  . memantine (NAMENDA) 10 MG tablet Take 1 tablet (10 mg total) by mouth 2 (two) times daily. 60 tablet 11  . alendronate (FOSAMAX) 70 MG tablet TAKE ONE TABLET BY MOUTH EVERY 7 DAYS. TAKE WITH A FULL GLASS OF WATER ON AN EMPTY STOMACH. MUST REMAIN UPRIGHT AT LEAST 30 MINUTES AFTER TAKING. 24 tablet 0  . atorvastatin (LIPITOR) 10 MG tablet Take 1 tablet (10 mg total) by mouth daily. 90 tablet 3  . guaiFENesin-dextromethorphan (ROBITUSSIN DM) 100-10 MG/5ML syrup Take 5 mLs by mouth every 4 (four) hours as needed for cough. 236 mL 0  . lisinopril (PRINIVIL,ZESTRIL) 5 MG tablet Take 1 tablet (5 mg total) by mouth daily. 30 tablet 2   No facility-administered medications prior to visit.      Review of Systems  Constitutional: Positive for malaise/fatigue.  HENT: Positive for congestion. Negative for sore throat.   Eyes: Negative for discharge.  Respiratory: Positive for cough.   Cardiovascular: Negative.   Gastrointestinal: Negative.   Skin: Negative.   Psychiatric/Behavioral: Negative.      Objective:  BP 138/82 (BP Location: Right Arm, Patient Position: Sitting,  Cuff Size: Large)   Pulse 79   Temp 98.9 F (37.2 C) (Oral)   Resp 16   Ht 5\' 1"  (1.549 m)   Wt 137 lb 3.2 oz (62.2 kg)   SpO2 96%   BMI 25.92 kg/m   BP/Weight 04/28/2017 3/82/5053 11/15/6732  Systolic BP 193 790 240  Diastolic BP 82 85 76  Wt. (Lbs) 137.2 140 137.2  BMI 25.92 25.61 25.92    Physical Exam  Nursing note and vitals reviewed. Constitutional: She appears well-developed and well-nourished. She is cooperative. She appears toxic. No distress.  HENT:  Head: Normocephalic and atraumatic.  Right Ear: There is drainage (excessive cerumen).  Left Ear: There is drainage (excessive cerumen).  Nose: Mucosal edema and rhinorrhea present.  Eyes: Conjunctivae are normal. Right eye exhibits no discharge. Left eye exhibits no discharge.  Neck: Normal range of motion. Neck supple.  Cardiovascular: Normal rate, regular rhythm, normal heart sounds and intact distal pulses.  Respiratory: Effort normal and breath sounds normal. She has no wheezes. She has no rales.  GI: Soft. Bowel sounds are normal. There is no tenderness.  Lymphadenopathy:    She has no cervical adenopathy.  Neurological: She is alert.  Skin: Skin is warm and dry.  Psychiatric: She has a normal mood and affect.    Assessment & Plan:   1. Upper respiratory tract infection, unspecified type  - Respiratory virus panel - guaiFENesin-dextromethorphan (ROBITUSSIN DM) 100-10 MG/5ML syrup; Take 5 mLs by mouth every  4 (four) hours as needed for cough.  Dispense: 236 mL; Refill: 0 - azithromycin (ZITHROMAX) 250 MG tablet; FIRST DAY TAKE ONE TABLET BY MOUTH. THEN TAKE ONE TABLET BY MOUTH DAILY.  Dispense: 6 tablet; Refill: 0 - fluticasone (FLONASE) 50 MCG/ACT nasal spray; Place 2 sprays into both nostrils daily.  Dispense: 16 g; Refill: 6 - DG Chest 2 View; Future - CBC - Basic Metabolic Panel  2. Essential hypertension BP controlled on current medication. Requesting medication refills [place]. She has refills available.  3. Chronic cough  - DG Chest 2 View; Future - CBC - Basic Metabolic Panel  4. Medication refill Requesting medication refills will be visiting out of the country [place] 3 [place]. - atorvastatin (LIPITOR) 10 MG tablet; Take 1 tablet (10 mg total) by mouth daily.  Dispense: 90 tablet; Refill: 2 - alendronate (FOSAMAX) 70 MG tablet; TAKE ONE TABLET BY MOUTH EVERY 7 DAYS. TAKE WITH A FULL GLASS OF WATER ON AN EMPTY STOMACH. MUST REMAIN UPRIGHT AT LEAST 30 MINUTES AFTER TAKING.  Dispense: 24 tablet; Refill: 0  5. Osteoporosis without current pathological fracture, unspecified osteoporosis type  - alendronate (FOSAMAX) 70 MG tablet; TAKE ONE TABLET BY MOUTH EVERY 7 DAYS. TAKE WITH A FULL GLASS OF WATER ON AN EMPTY STOMACH. MUST REMAIN UPRIGHT AT LEAST 30 MINUTES AFTER TAKING.  Dispense: 24 tablet; Refill: 0  6. Excessive cerumen in both ear canals  - carbamide peroxide (DEBROX) 6.5 % OTIC solution; Place 10 drops into both ears 2 (two) times daily.  Dispense: 15 mL; Refill: 0    Follow-up: Return in about 3 months (around 07/26/2017), or if symptoms worsen or fail to improve, for HTN.   Alfonse Spruce FNP

## 2017-04-29 LAB — BASIC METABOLIC PANEL
BUN / CREAT RATIO: 24 (ref 12–28)
BUN: 28 mg/dL — AB (ref 8–27)
CO2: 20 mmol/L (ref 20–29)
CREATININE: 1.18 mg/dL — AB (ref 0.57–1.00)
Calcium: 9.8 mg/dL (ref 8.7–10.3)
Chloride: 105 mmol/L (ref 96–106)
GFR calc non Af Amer: 45 mL/min/{1.73_m2} — ABNORMAL LOW (ref >59)
GFR, EST AFRICAN AMERICAN: 52 mL/min/{1.73_m2} — AB (ref >59)
GLUCOSE: 116 mg/dL — AB (ref 65–99)
Potassium: 4.3 mmol/L (ref 3.5–5.2)
SODIUM: 141 mmol/L (ref 134–144)

## 2017-04-29 LAB — CBC
HEMOGLOBIN: 14.5 g/dL (ref 11.1–15.9)
Hematocrit: 41.6 % (ref 34.0–46.6)
MCH: 30.6 pg (ref 26.6–33.0)
MCHC: 34.9 g/dL (ref 31.5–35.7)
MCV: 88 fL (ref 79–97)
Platelets: 262 10*3/uL (ref 150–379)
RBC: 4.74 x10E6/uL (ref 3.77–5.28)
RDW: 13.7 % (ref 12.3–15.4)
WBC: 6.6 10*3/uL (ref 3.4–10.8)

## 2017-04-30 ENCOUNTER — Other Ambulatory Visit: Payer: Self-pay | Admitting: Family Medicine

## 2017-04-30 DIAGNOSIS — I10 Essential (primary) hypertension: Secondary | ICD-10-CM

## 2017-04-30 LAB — RESPIRATORY VIRUS PANEL
ADENOVIRUS: NEGATIVE
INFLUENZA A: NEGATIVE
INFLUENZA B 1: NEGATIVE
METAPNEUMOVIRUS: NEGATIVE
PARAINFLUENZA 2 A: NEGATIVE
Parainfluenza 1: NEGATIVE
Parainfluenza 3: NEGATIVE
RESPIRATORY SYNCYTIAL VIRUS A: NEGATIVE
Respiratory Syncytial Virus B: NEGATIVE
Rhinovirus: NEGATIVE

## 2017-04-30 MED ORDER — AMLODIPINE BESYLATE 2.5 MG PO TABS: 2.5000 mg | ORAL_TABLET | Freq: Every day | ORAL | 1 refills | Status: DC

## 2017-05-07 ENCOUNTER — Telehealth: Payer: Self-pay | Admitting: Family Medicine

## 2017-05-07 NOTE — Telephone Encounter (Signed)
Patient stopped by and asked about her new orange card. Patient stated her mother has yet to receive a letter. Please fu

## 2017-05-12 MED FILL — AMLODIPINE BESYLATE 2.5 MG: 2.5 | 30 days supply | Qty: 30 | Fill #0

## 2017-05-12 MED FILL — MEMANTINE HCL 10 MG TABLET: 10 | 30 days supply | Qty: 60 | Fill #1

## 2017-05-12 NOTE — Telephone Encounter (Signed)
Spoke to patient daughter about results and result message. Verbalized understanding and teach back method applied. Pt is going to [place] on March 7th. Instructed to make appointment for BP check upon return from visit. Pt daughter verbalized understanding.

## 2017-07-30 ENCOUNTER — Ambulatory Visit: Payer: Self-pay | Attending: Family Medicine

## 2017-08-13 ENCOUNTER — Ambulatory Visit: Payer: Self-pay | Attending: Family Medicine

## 2017-09-15 ENCOUNTER — Ambulatory Visit: Payer: Self-pay | Admitting: Adult Health

## 2017-09-23 ENCOUNTER — Encounter: Payer: Self-pay | Admitting: Adult Health

## 2017-09-23 ENCOUNTER — Ambulatory Visit (INDEPENDENT_AMBULATORY_CARE_PROVIDER_SITE_OTHER): Payer: Self-pay | Admitting: Adult Health

## 2017-09-23 VITALS — BP 133/77 | HR 88 | Ht 61.0 in | Wt 141.5 lb

## 2017-09-23 DIAGNOSIS — G301 Alzheimer's disease with late onset: Secondary | ICD-10-CM

## 2017-09-23 DIAGNOSIS — F0280 Dementia in other diseases classified elsewhere without behavioral disturbance: Secondary | ICD-10-CM

## 2017-09-23 MED ORDER — DONEPEZIL HCL 10 MG PO TABS: 10.0000 mg | ORAL_TABLET | Freq: Every day | ORAL | 3 refills | Status: DC

## 2017-09-23 MED ORDER — MEMANTINE HCL 10 MG PO TABS: 10.0000 mg | ORAL_TABLET | Freq: Two times a day (BID) | ORAL | 3 refills | Status: DC

## 2017-09-23 MED FILL — DONEPEZIL HCL 10 MG TABLET: 10 | 30 days supply | Qty: 30 | Fill #0

## 2017-09-23 MED FILL — MEMANTINE HCL 10 MG TABLET: 10 | 30 days supply | Qty: 60 | Fill #0

## 2017-09-23 NOTE — Patient Instructions (Signed)
Your Plan:  Continue Aricept and Namenda Memory score is stable If your symptoms worsen or you develop new symptoms please let us know.    Thank you for coming to see us at [facility]. I hope we have been able to provide you high quality care today.  You may receive a patient satisfaction survey over the next few weeks. We would appreciate your feedback and comments so that we may continue to improve ourselves and the health of our patients.  

## 2017-09-23 NOTE — Progress Notes (Signed)
PATIENT: Kara Dennis DOB: 10-06-1940  REASON FOR VISIT: follow up HISTORY FROM: patient  HISTORY OF PRESENT ILLNESS: Today 09/23/17:  Kara Dennis is a 77 year old female with a history of Alzheimer's dementia.  She returns today for follow-up.  She continues on Aricept and Namenda.  She tolerates both medications well.  She lives with her son.  She requires assistance with ADLs.  No change in her mood or behavior.  Denies Kara trouble sleeping.  Reports good appetite.  She returns today for evaluation.  HISTORY Patient is here for dementia. She is on Aricept 10mg  a day and Namenda 5mg  a day. Here with interpreter. Also here with daughter who provides most information. Patient says she is well. Still living with daughter and alternates with brother, most of the time she is with someone all day. Memory stable. Will increase Namenda to 10mg  a day. Then at next appointment may increase Aricept. No falls. She is sleeping well. No mood or behavioral problems. She spends time with family, she walks around the house. She does not walk alone outside. No problems swallowing or eating. She is taking a multivitamin with folate every day.   Reviewed images of the brain and agree with the following:   Abnormal MRI brain (without) demonstrating: 1. 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 2. Mild periventricular and subcortical chronic small vessel ischemic disease.  3. No acute findings. 4. Incidentally noted is extensive mucus / fluid in the frontal, ethmoid and sphenoid sinuses. Correlated clinically for sinusitis.  09/02/16: Kara Dennis is a 77 year old femalewith a history of memory loss. She is here today with her daughter and an interpreter. Her daughter reports that she feels that her memory has gotten a little better. She still notices some changes with her short-term memory. The patient lives with her daughter. She does require assistance with ADLs. She is not operating  a motor vehicle. She does notprepare Kara meals. Her daughter handles all of her finances. Daughter denies Kara changes in mood or behavior. Denies hallucinations. Reports that the patient is sleeping well. They deny Kara new neurological symptoms. She returns today for an evaluation.    EXB:MWUX Kara Dangis a 77 y.o.femalehere as a referral from Dr. Harland Dingwall memory loss. Patient is here with family and interpreter. Patient says she does not have memory loss. Both daughter and patient use interpreter. The memory loss is severe. Memory changes started in 2014. Daughter provides all information vis interpreter. She became more confused in 2015. She doesn't know how many children she has. But she recognizes her children. She doesn't know the date. She doesn't know what time of the day it is. She lives with daughter. Slowly progressive. She can't differentiate between tea and soy sauce. She dresses on her own. Daughter helps with bathing. Daughter brings her food and eats independently. She can't use the phone anymore. She confuses the remote with the phone. No FHx of dementia. No hallucinations or delusions. She is often afraid of thiefs and burglers and wants to lock the doors. No falls, no swallowing dififculties. No FHx of dementia. Nomemory loss in brothers and sisters.No other focal neurologic deficits, associated symptoms, inciting events or modifiable factors.   REVIEW OF SYSTEMS: Out of a complete 14 system review of symptoms, the patient complains only of the following symptoms, and all other reviewed systems are negative.  See HPI   ALLERGIES: No Known Allergies  HOME MEDICATIONS: Outpatient Medications Prior to Visit  Medication Sig Dispense Refill  . alendronate (FOSAMAX) 70 MG tablet TAKE ONE TABLET BY MOUTH EVERY 7 DAYS. TAKE WITH A FULL GLASS OF WATER ON AN EMPTY STOMACH. MUST REMAIN UPRIGHT AT LEAST 30 MINUTES AFTER TAKING. 24 tablet 0  . amLODipine (NORVASC) 2.5 MG tablet  Take 1 tablet (2.5 mg total) by mouth daily. 30 tablet 1  . atorvastatin (LIPITOR) 10 MG tablet Take 1 tablet (10 mg total) by mouth daily. 90 tablet 2  . azithromycin (ZITHROMAX) 250 MG tablet FIRST DAY TAKE ONE TABLET BY MOUTH. THEN TAKE ONE TABLET BY MOUTH DAILY. 6 tablet 0  . carbamide peroxide (DEBROX) 6.5 % OTIC solution Place 10 drops into both ears 2 (two) times daily. 15 mL 0  . donepezil (ARICEPT) 10 MG tablet Take 1 tablet (10 mg total) by mouth at bedtime. 30 tablet 11  . fluticasone (FLONASE) 50 MCG/ACT nasal spray Place 2 sprays into both nostrils daily as needed. 16 g 0  . fluticasone (FLONASE) 50 MCG/ACT nasal spray Place 2 sprays into both nostrils daily. 16 g 6  . guaiFENesin-dextromethorphan (ROBITUSSIN DM) 100-10 MG/5ML syrup Take 5 mLs by mouth every 4 (four) hours as needed for cough. 236 mL 0  . memantine (NAMENDA) 10 MG tablet Take 1 tablet (10 mg total) by mouth 2 (two) times daily. 60 tablet 11   No facility-administered medications prior to visit.     PAST MEDICAL HISTORY: Past Medical History:  Diagnosis Date  . Bradycardia 2014    identified before hysterectomy, treated for short time with medication before during and after hysterectomy   . Cervical cancer ([place]) 2014    s/p hysterectomy, no chemo or radiation   . Dementia   . HTN (hypertension) 2013   took some medicine in [place]     PAST SURGICAL HISTORY: Past Surgical History:  Procedure Laterality Date  . ABDOMINAL HYSTERECTOMY  05/2012     FAMILY HISTORY: Family History  Problem Relation Age of Onset  . Diabetes Brother   . Heart disease Neg Hx   . Cancer Neg Hx   . Dementia Neg Hx   . Breast cancer Neg Hx     SOCIAL HISTORY: Social History   Socioeconomic History  . Marital status: Widowed    Spouse name: Not on file  . Number of children: 6   . Years of education: 3  . Highest education level: Not on file  Occupational History  . Occupation: Agricultural engineer   Social Needs  . Financial  resource strain: Not on file  . Food insecurity:    Worry: Not on file    Inability: Not on file  . Transportation needs:    Medical: Not on file    Non-medical: Not on file  Tobacco Use  . Smoking status: Never Smoker  . Smokeless tobacco: Never Used  Substance and Sexual Activity  . Alcohol use: No    Alcohol/week: 0.0 oz  . Drug use: No  . Sexual activity: Never    Birth control/protection: None  Lifestyle  . Physical activity:    Days per week: Not on file    Minutes per session: Not on file  . Stress: Not on file  Relationships  . Social connections:    Talks on phone: Not on file    Gets together: Not on file    Attends religious service: Not on file    Active member of club or organization: Not on file    Attends meetings of clubs  or organizations: Not on file    Relationship status: Not on file  . Intimate partner violence:    Fear of current or ex partner: Not on file    Emotionally abused: Not on file    Physically abused: Not on file    Forced sexual activity: Not on file  Other Topics Concern  . Not on file  Social History Narrative   Lives with daughter, [place], [place].    Moved from [place] in 05/2013.   Has 6 children total.   4 in [place]   1 in [place]   1 in [place]       Has many grandchildren. 17 total.    Right-handed   Caffeine: 1 cup every other day      PHYSICAL EXAM  Vitals:   09/23/17 1414  BP: 133/77  Pulse: 88  Weight: 141 lb 8 oz (64.2 kg)  Height: 5\' 1"  (1.549 m)   Body mass index is 26.74 kg/m.   MMSE - Mini Mental State Exam 09/02/2016 05/30/2016 02/16/2014  Orientation to time 0 0 2  Orientation to time comments - - did not know year, date of day of the month   Orientation to Place 0 1 3  Orientation to Place-comments - - did not know state or city   Registration 3 3 3   Attention/ Calculation 5 1 5   Recall 0 0 3  Language- name 2 objects 2 2 2   Language- repeat 1 0 1  Language- follow 3 step command 2 3 3   Language-  read & follow direction 1 1 1   Write a sentence 0 1 1  Copy design 1 1 1   Total score 15 13 25      Generalized: Well developed, in no acute distress   Neurological examination  Mentation: Alert oriented to time, place, history taking. Follows all commands speech and language fluent Cranial nerve II-XII: Pupils were equal round reactive to light. Extraocular movements were full, visual field were full on confrontational test. Facial sensation and strength were normal. Uvula tongue midline. Head turning and shoulder shrug  were normal and symmetric. Motor: The motor testing reveals 5 over 5 strength of all 4 extremities. Good symmetric motor tone is noted throughout.  Sensory: Sensory testing is intact to soft touch on all 4 extremities. No evidence of extinction is noted.  Coordination: Cerebellar testing reveals good finger-nose-finger and heel-to-shin bilaterally.  Gait and station: Gait is normal.  Reflexes: Deep tendon reflexes are symmetric and normal bilaterally.   DIAGNOSTIC DATA (LABS, IMAGING, TESTING) - I reviewed patient records, labs, notes, testing and imaging myself where available.  Lab Results  Component Value Date   WBC 6.6 04/28/2017   HGB 14.5 04/28/2017   HCT 41.6 04/28/2017   MCV 88 04/28/2017   PLT 262 04/28/2017      Component Value Date/Time   NA 141 04/28/2017 1216   K 4.3 04/28/2017 1216   CL 105 04/28/2017 1216   CO2 20 04/28/2017 1216   GLUCOSE 116 (H) 04/28/2017 1216   GLUCOSE 172 (H) 06/04/2016 2003   BUN 28 (H) 04/28/2017 1216   CREATININE 1.18 (H) 04/28/2017 1216   CREATININE 0.85 01/28/2014 1158   CALCIUM 9.8 04/28/2017 1216   PROT 7.2 02/13/2017 0905   ALBUMIN 4.2 02/13/2017 0905   AST 19 02/13/2017 0905   ALT 10 02/13/2017 0905   ALKPHOS 104 02/13/2017 0905   BILITOT 0.4 02/13/2017 0905   GFRNONAA 45 (L) 04/28/2017 1216   GFRNONAA 68  01/28/2014 1158   GFRAA 52 (L) 04/28/2017 1216   GFRAA 79 01/28/2014 1158   Lab Results  Component  Value Date   CHOL 128 02/13/2017   HDL 49 02/13/2017   LDLCALC 55 02/13/2017   TRIG 120 02/13/2017   CHOLHDL 2.6 02/13/2017   Lab Results  Component Value Date   HGBA1C 5.5 03/31/2017   Lab Results  Component Value Date   VHQIONGE95 284 05/30/2016   Lab Results  Component Value Date   TSH 0.964 11/14/2016      ASSESSMENT AND PLAN 77 y.o. year old female  has a past medical history of Bradycardia (2014 ), Cervical cancer ([place]) (2014 ), Dementia, and HTN (hypertension) (2013). here with:  1.  Memory disturbance  The patient's memory score has remained stable.  She will continue on Aricept and Namenda.  I have advised that if her symptoms worsen or she develops new symptoms she should let us know.  She will follow-up in 6 months or sooner if needed.   I spent 15 minutes with the patient. 50% of this time was spent reviewing her memory score   Ward Givens, MSN, NP-C 09/23/2017, 2:37 PM [facility] [address], [place], [place] [address] [phone]

## 2017-09-23 NOTE — Progress Notes (Signed)
Participated in, made any corrections needed, and agree with history, physical, neuro exam,assessment and plan as stated above.

## 2017-11-14 MED FILL — MEMANTINE HCL 10 MG TABLET: 10 | 30 days supply | Qty: 60 | Fill #1

## 2017-11-14 MED FILL — DONEPEZIL HCL 10 MG TABLET: 10 | 30 days supply | Qty: 30 | Fill #1

## 2017-12-14 ENCOUNTER — Emergency Department (HOSPITAL_COMMUNITY): Payer: Self-pay

## 2017-12-14 ENCOUNTER — Observation Stay (HOSPITAL_COMMUNITY)
Admission: EM | Admit: 2017-12-14 | Discharge: 2017-12-15 | Disposition: A | Discharge status: Home / Self Care | Payer: Self-pay | Attending: Internal Medicine | Admitting: Internal Medicine

## 2017-12-14 ENCOUNTER — Encounter (HOSPITAL_COMMUNITY): Payer: Self-pay | Admitting: Emergency Medicine

## 2017-12-14 ENCOUNTER — Other Ambulatory Visit: Payer: Self-pay

## 2017-12-14 DIAGNOSIS — G309 Alzheimer's disease, unspecified: Secondary | ICD-10-CM | POA: Insufficient documentation

## 2017-12-14 DIAGNOSIS — Z7951 Long term (current) use of inhaled steroids: Secondary | ICD-10-CM | POA: Insufficient documentation

## 2017-12-14 DIAGNOSIS — Z90710 Acquired absence of both cervix and uterus: Secondary | ICD-10-CM | POA: Insufficient documentation

## 2017-12-14 DIAGNOSIS — Z8541 Personal history of malignant neoplasm of cervix uteri: Secondary | ICD-10-CM | POA: Insufficient documentation

## 2017-12-14 DIAGNOSIS — F0280 Dementia in other diseases classified elsewhere without behavioral disturbance: Secondary | ICD-10-CM | POA: Insufficient documentation

## 2017-12-14 DIAGNOSIS — G319 Degenerative disease of nervous system, unspecified: Secondary | ICD-10-CM | POA: Insufficient documentation

## 2017-12-14 DIAGNOSIS — I951 Orthostatic hypotension: Secondary | ICD-10-CM | POA: Diagnosis present

## 2017-12-14 DIAGNOSIS — I10 Essential (primary) hypertension: Secondary | ICD-10-CM | POA: Insufficient documentation

## 2017-12-14 DIAGNOSIS — I6782 Cerebral ischemia: Secondary | ICD-10-CM | POA: Insufficient documentation

## 2017-12-14 DIAGNOSIS — R55 Syncope and collapse: Principal | ICD-10-CM | POA: Insufficient documentation

## 2017-12-14 DIAGNOSIS — F0390 Unspecified dementia without behavioral disturbance: Secondary | ICD-10-CM | POA: Diagnosis present

## 2017-12-14 DIAGNOSIS — R9082 White matter disease, unspecified: Secondary | ICD-10-CM | POA: Insufficient documentation

## 2017-12-14 DIAGNOSIS — R001 Bradycardia, unspecified: Secondary | ICD-10-CM | POA: Insufficient documentation

## 2017-12-14 DIAGNOSIS — I420 Dilated cardiomyopathy: Secondary | ICD-10-CM | POA: Insufficient documentation

## 2017-12-14 DIAGNOSIS — Z79899 Other long term (current) drug therapy: Secondary | ICD-10-CM | POA: Insufficient documentation

## 2017-12-14 LAB — URINALYSIS, ROUTINE W REFLEX MICROSCOPIC
BACTERIA UA: NONE SEEN
BILIRUBIN URINE: NEGATIVE
Glucose, UA: NEGATIVE mg/dL
KETONES UR: NEGATIVE mg/dL
Nitrite: NEGATIVE
PROTEIN: NEGATIVE mg/dL
Specific Gravity, Urine: 1.015 (ref 1.005–1.030)
pH: 6 (ref 5.0–8.0)

## 2017-12-14 LAB — I-STAT TROPONIN, ED: Troponin i, poc: 0 ng/mL (ref 0.00–0.08)

## 2017-12-14 LAB — BASIC METABOLIC PANEL
Anion gap: 9 (ref 5–15)
BUN: 19 mg/dL (ref 8–23)
CHLORIDE: 108 mmol/L (ref 98–111)
CO2: 20 mmol/L — ABNORMAL LOW (ref 22–32)
Calcium: 9.4 mg/dL (ref 8.9–10.3)
Creatinine, Ser: 1.1 mg/dL — ABNORMAL HIGH (ref 0.44–1.00)
GFR calc Af Amer: 55 mL/min — ABNORMAL LOW (ref >60)
GFR, EST NON AFRICAN AMERICAN: 47 mL/min — AB (ref >60)
GLUCOSE: 109 mg/dL — AB (ref 70–99)
Potassium: 3.5 mmol/L (ref 3.5–5.1)
SODIUM: 137 mmol/L (ref 135–145)

## 2017-12-14 LAB — CBC
HCT: 44 % (ref 36.0–46.0)
Hemoglobin: 14.9 g/dL (ref 12.0–15.0)
MCH: 31 pg (ref 26.0–34.0)
MCHC: 33.9 g/dL (ref 30.0–36.0)
MCV: 91.5 fL (ref 78.0–100.0)
PLATELETS: 226 10*3/uL (ref 150–400)
RBC: 4.81 MIL/uL (ref 3.87–5.11)
RDW: 12.5 % (ref 11.5–15.5)
WBC: 5.8 10*3/uL (ref 4.0–10.5)

## 2017-12-14 MED ORDER — ENOXAPARIN SODIUM 40 MG/0.4ML ~~LOC~~ SOLN
40.0000 mg | SUBCUTANEOUS | Status: DC
  Filled 2017-12-14: qty 0.4

## 2017-12-14 MED ORDER — MEMANTINE HCL 10 MG PO TABS
10.0000 mg | ORAL_TABLET | Freq: Two times a day (BID) | ORAL | Status: DC
  Administered 2017-12-14 – 2017-12-15 (×2): 10 mg via ORAL
  Filled 2017-12-14 (×2): qty 1

## 2017-12-14 MED ORDER — SODIUM CHLORIDE 0.9% FLUSH
3.0000 mL | Freq: Two times a day (BID) | INTRAVENOUS | Status: DC
  Administered 2017-12-14 – 2017-12-15 (×3): 3 mL via INTRAVENOUS

## 2017-12-14 MED ORDER — DONEPEZIL HCL 10 MG PO TABS
10.0000 mg | ORAL_TABLET | Freq: Every day | ORAL | Status: DC
  Administered 2017-12-14: 10 mg via ORAL
  Filled 2017-12-14: qty 1

## 2017-12-14 NOTE — ED Notes (Signed)
Admitting at the bedside.  

## 2017-12-14 NOTE — ED Provider Notes (Signed)
[facility] EMERGENCY DEPARTMENT Provider Note   CSN: [id number] Arrival date & time: 12/14/17  1048     History   Chief Complaint Chief Complaint  Patient presents with  . Loss of Consciousness    HPI Kara Dennis is a 77 y.o. female.  77 year old female with prior medical history as documented below presents for evaluation following syncope.  Patient resides with her daughter.  Daughter provides today's history.  Daughter reports that the patient was being helped into the bathroom to brush her teeth this morning around 9 AM.  Patient then syncopized and collapsed.  The daughter was at her side and helped her to the floor.  She was unresponsive for 5 minutes.  She was then groggy for approximately 10 to 15 minutes and then returned to her normal mental state.  She had no preceding symptoms.  Following the syncope she is without complaint.  There was no reported chest pain or shortness of breath.  The patient does not experience symptoms like this frequently.  The history is provided by the patient and medical records. The history is limited by a language barrier.  Loss of Consciousness   This is a new problem. The current episode started 6 to 12 hours ago. The problem occurs rarely. The problem has been resolved. She lost consciousness for a period of 1 to 5 minutes. The problem is associated with normal activity. Pertinent negatives include chest pain. She has tried nothing for the symptoms.    Past Medical History:  Diagnosis Date  . Bradycardia 2014    identified before hysterectomy, treated for short time with medication before during and after hysterectomy   . Cervical cancer ([place]) 2014    s/p hysterectomy, no chemo or radiation   . Dementia ([place])   . HTN (hypertension) 2013   took some medicine in [place]     Patient Active Problem List   Diagnosis Date Noted  . Prediabetes 06/30/2016  . Neck mass 04/17/2016  . High cholesterol 01/31/2014  . History  of cervical cancer 01/28/2014  . Dementia ([place]) 01/28/2014  . HTN (hypertension)     Past Surgical History:  Procedure Laterality Date  . ABDOMINAL HYSTERECTOMY  05/2012      OB History   None      Home Medications    Prior to Admission medications   Medication Sig Start Date End Date Taking? Authorizing Provider  alendronate (FOSAMAX) 70 MG tablet TAKE ONE TABLET BY MOUTH EVERY 7 DAYS. TAKE WITH A FULL GLASS OF WATER ON AN EMPTY STOMACH. MUST REMAIN UPRIGHT AT LEAST 30 MINUTES AFTER TAKING. 04/28/17   Alfonse Spruce, FNP  amLODipine (NORVASC) 2.5 MG tablet Take 1 tablet (2.5 mg total) by mouth daily. 04/30/17   Alfonse Spruce, FNP  atorvastatin (LIPITOR) 10 MG tablet Take 1 tablet (10 mg total) by mouth daily. 04/28/17   Alfonse Spruce, FNP  azithromycin (ZITHROMAX) 250 MG tablet FIRST DAY TAKE ONE TABLET BY MOUTH. THEN TAKE ONE TABLET BY MOUTH DAILY. 04/28/17   Alfonse Spruce, FNP  carbamide peroxide (DEBROX) 6.5 % OTIC solution Place 10 drops into both ears 2 (two) times daily. 04/28/17   Alfonse Spruce, FNP  donepezil (ARICEPT) 10 MG tablet Take 1 tablet (10 mg total) by mouth at bedtime. 09/23/17   Ward Givens, NP  fluticasone (FLONASE) 50 MCG/ACT nasal spray Place 2 sprays into both nostrils daily as needed. 03/31/17   Alfonse Spruce, FNP  fluticasone (  FLONASE) 50 MCG/ACT nasal spray Place 2 sprays into both nostrils daily. 04/28/17   Alfonse Spruce, FNP  guaiFENesin-dextromethorphan (ROBITUSSIN DM) 100-10 MG/5ML syrup Take 5 mLs by mouth every 4 (four) hours as needed for cough. 04/28/17   Alfonse Spruce, FNP  memantine (NAMENDA) 10 MG tablet Take 1 tablet (10 mg total) by mouth 2 (two) times daily. 09/23/17   Ward Givens, NP    Family History Family History  Problem Relation Age of Onset  . Diabetes Brother   . Heart disease Neg Hx   . Cancer Neg Hx   . Dementia Neg Hx   . Breast cancer Neg Hx     Social History Social  History   Tobacco Use  . Smoking status: Never Smoker  . Smokeless tobacco: Never Used  Substance Use Topics  . Alcohol use: No    Alcohol/week: 0.0 standard drinks  . Drug use: No     Allergies   Patient has no known allergies.   Review of Systems Review of Systems  Cardiovascular: Positive for syncope. Negative for chest pain.  All other systems reviewed and are negative.    Physical Exam Updated Vital Signs BP (!) 154/77   Pulse 72   Temp 98.6 F (37 C) (Oral)   Resp 15   SpO2 95%   Physical Exam  Constitutional: She is oriented to person, place, and time. She appears well-developed and well-nourished. No distress.  HENT:  Head: Normocephalic and atraumatic.  Mouth/Throat: Oropharynx is clear and moist.  Eyes: Pupils are equal, round, and reactive to light. Conjunctivae and EOM are normal.  Neck: Normal range of motion. Neck supple.  Cardiovascular: Normal rate, regular rhythm and normal heart sounds.  Pulmonary/Chest: Effort normal and breath sounds normal. No respiratory distress.  Abdominal: Soft. She exhibits no distension. There is no tenderness.  Musculoskeletal: Normal range of motion. She exhibits no edema or deformity.  Neurological: She is alert and oriented to person, place, and time.  Skin: Skin is warm and dry.  Psychiatric: She has a normal mood and affect.  Nursing note and vitals reviewed.    ED Treatments / Results  Labs (all labs ordered are listed, but only abnormal results are displayed) Labs Reviewed  BASIC METABOLIC PANEL - Abnormal; Notable for the following components:      Result Value   CO2 20 (*)    Glucose, Bld 109 (*)    Creatinine, Ser 1.10 (*)    GFR calc non Af Amer 47 (*)    GFR calc Af Amer 55 (*)    All other components within normal limits  CBC  URINALYSIS, ROUTINE W REFLEX MICROSCOPIC  I-STAT TROPONIN, ED  CBG MONITORING, ED    EKG EKG Interpretation  Date/Time:  Sunday December 14 2017 11:11:45  EDT Ventricular Rate:  88 PR Interval:  156 QRS Duration: 76 QT Interval:  392 QTC Calculation: 474 R Axis:   78 Text Interpretation:  Normal sinus rhythm Normal ECG Reconfirmed by Dene Gentry [phone]) on 12/14/2017 1:52:04 PM   Radiology Ct Head Wo Contrast  Result Date: 12/14/2017 CLINICAL DATA:  Multiple short syncopal episodes this morning, history hypertension, dementia EXAM: CT HEAD WITHOUT CONTRAST TECHNIQUE: Contiguous axial images were obtained from the base of the skull through the vertex without intravenous contrast. Sagittal and coronal MPR images reconstructed from axial data set. COMPARISON:  None FINDINGS: Brain: Normal ventricular morphology. No midline shift or mass effect. Normal appearance of brain parenchyma. No intracranial hemorrhage,  mass lesion, evidence of acute infarction, or extra-axial fluid collection. Vascular: Mild atherosclerotic calcification of internal carotid arteries bilaterally at skull base Skull: Demineralized but intact Sinuses/Orbits: Partial opacification and air-fluid levels of the frontal sinus. Minimally mucosal thickening in a few ethmoid air cells bilaterally. Remaining visualized paranasal sinuses and mastoid air cells clear Other: N/A IMPRESSION: Atrophy with small vessel chronic ischemic changes of deep cerebral white matter. No acute intracranial abnormalities. Frontal sinus disease changes. Electronically Signed   By: Lavonia Dana M.D.   On: 12/14/2017 13:22   Dg Chest Port 1 View  Result Date: 12/14/2017 CLINICAL DATA:  Syncope, weakness EXAM: PORTABLE CHEST 1 VIEW COMPARISON:  06/05/2016 FINDINGS: Heart and mediastinal contours are within normal limits. No focal opacities or effusions. No acute bony abnormality. IMPRESSION: No active disease. Electronically Signed   By: Rolm Baptise M.D.   On: 12/14/2017 12:49    Procedures Procedures (including critical care time)  Medications Ordered in ED Medications - No data to display   Initial  Impression / Assessment and Plan / ED Course  I have reviewed the triage vital signs and the nursing notes.  Pertinent labs & imaging results that were available during my care of the patient were reviewed by me and considered in my medical decision making (see chart for details).     MDM  Screen complete  Patient is presenting following reported syncope.  Initial exam and screening labs are without significant abnormality.  Patient likely would benefit from overnight observation.  Hospitalist service is aware of case and will evaluate for admission.  Final Clinical Impressions(s) / ED Diagnoses   Final diagnoses:  Syncope and collapse    ED Discharge Orders    None       Valarie Merino, MD 12/14/17 1352

## 2017-12-14 NOTE — H&P (Signed)
History and Physical    Kara Dennis ZOX:[id number] DOB: 1940/05/22 DOA: 12/14/2017  PCP: Alfonse Spruce, FNP Patient coming from: Home  Chief Complaint: Syncope  HPI: Kara Dennis is a 77 y.o. female with medical history significant of bradycardia in 2014, cervical cancer status post hysterectomy, dementia, hypertension presenting to the hospital for further work-up of syncope.  Patient speaks [place] only and does not speak [place].  History was provided by her to both daughters present at the bedside.  Per daughter, patient went to the bathroom this morning to brush her teeth and all of a sudden passed out.  Daughter was present in the bathroom with her at that time and helped her lay down on the floor. She did not hit her head or sustain any injuries.  She remained unconscious for about 5 minutes but did not seem confused after she woke up.  Daughter states this has never happened to the patient before.  Daughter did not notice any tongue biting, body shaking/jerking motion, or urinary/fecal incontinence.  Per family, patient is healthy at baseline and the only medications she takes are donezepil and memantine for Alzheimer's.  Patient's appetite has been good.  Does not take any blood pressure medications.  Per patient, she did not have any chest pain, shortness of breath, headaches, or palpitations prior to this episode.  Patient denies having any fevers, chills, or dysuria.  ED Course: Vitals on arrival to the ED-afebrile, pulse 86, respiratory rate 15, blood pressure 128/73, and SPO2 98% on room air.  Labs showing blood glucose 109, creatinine 1.1 (at baseline).  No leukocytosis.  Troponin negative.  EKG not suggestive of ACS.  Chest x-ray showing no active disease.  Head CT showing atrophy with small vessel chronic ischemic changes of deep cerebral white matter and no acute intracranial abnormalities.  Review of Systems: As per HPI otherwise 10 point review of systems  negative.  Past Medical History:  Diagnosis Date  . Bradycardia 2014    identified before hysterectomy, treated for short time with medication before during and after hysterectomy   . Cervical cancer ([place]) 2014    s/p hysterectomy, no chemo or radiation   . Dementia ([place])   . HTN (hypertension) 2013   took some medicine in [place]     Past Surgical History:  Procedure Laterality Date  . ABDOMINAL HYSTERECTOMY  05/2012    Social history  reports that she has never smoked. She has never used smokeless tobacco. She reports that she does not drink alcohol or use drugs.  No Known Allergies  Family History  Problem Relation Age of Onset  . Diabetes Brother   . Heart disease Neg Hx   . Cancer Neg Hx   . Dementia Neg Hx   . Breast cancer Neg Hx     Prior to Admission medications   Medication Sig Start Date End Date Taking? Authorizing Provider  alendronate (FOSAMAX) 70 MG tablet TAKE ONE TABLET BY MOUTH EVERY 7 DAYS. TAKE WITH A FULL GLASS OF WATER ON AN EMPTY STOMACH. MUST REMAIN UPRIGHT AT LEAST 30 MINUTES AFTER TAKING. 04/28/17   Alfonse Spruce, FNP  amLODipine (NORVASC) 2.5 MG tablet Take 1 tablet (2.5 mg total) by mouth daily. 04/30/17   Alfonse Spruce, FNP  atorvastatin (LIPITOR) 10 MG tablet Take 1 tablet (10 mg total) by mouth daily. 04/28/17   Alfonse Spruce, FNP  azithromycin (ZITHROMAX) 250 MG tablet FIRST DAY TAKE ONE TABLET BY MOUTH. THEN TAKE ONE  TABLET BY MOUTH DAILY. 04/28/17   Alfonse Spruce, FNP  carbamide peroxide (DEBROX) 6.5 % OTIC solution Place 10 drops into both ears 2 (two) times daily. 04/28/17   Alfonse Spruce, FNP  donepezil (ARICEPT) 10 MG tablet Take 1 tablet (10 mg total) by mouth at bedtime. 09/23/17   Ward Givens, NP  fluticasone (FLONASE) 50 MCG/ACT nasal spray Place 2 sprays into both nostrils daily as needed. 03/31/17   Alfonse Spruce, FNP  fluticasone (FLONASE) 50 MCG/ACT nasal spray Place 2 sprays into both nostrils  daily. 04/28/17   Alfonse Spruce, FNP  guaiFENesin-dextromethorphan (ROBITUSSIN DM) 100-10 MG/5ML syrup Take 5 mLs by mouth every 4 (four) hours as needed for cough. 04/28/17   Alfonse Spruce, FNP  memantine (NAMENDA) 10 MG tablet Take 1 tablet (10 mg total) by mouth 2 (two) times daily. 09/23/17   Ward Givens, NP    Physical Exam: Vitals:   12/14/17 1245 12/14/17 1520 12/14/17 1534 12/14/17 2126  BP: (!) 154/77 (!) 159/87 (!) 175/80 (!) 170/60  Pulse: 72 82 79 69  Resp:   18 16  Temp:   98 F (36.7 C) 97.7 F (36.5 C)  TempSrc:   Oral Oral  SpO2: 95% 97% 98% 99%  Weight:   64 kg   Height:   5\' 3"  (1.6 m)     Physical Exam  Constitutional: She is oriented to person, place, and time. She appears well-developed and well-nourished. No distress.  HENT:  Head: Normocephalic and atraumatic.  Mouth/Throat: Oropharynx is clear and moist.  Eyes: Pupils are equal, round, and reactive to light. EOM are normal. Right eye exhibits no discharge. Left eye exhibits no discharge.  Neck: Neck supple. No tracheal deviation present.  Cardiovascular: Normal rate, regular rhythm and intact distal pulses. Exam reveals no gallop and no friction rub.  No murmur heard. Pulmonary/Chest: Effort normal and breath sounds normal. No respiratory distress. She has no wheezes. She has no rales.  Abdominal: Soft. Bowel sounds are normal. She exhibits no distension. There is no tenderness.  Musculoskeletal: She exhibits no edema.  Neurological: She is alert and oriented to person, place, and time. No cranial nerve deficit.  Strength 5 out of 5 in bilateral upper and lower extremities  Skin: Skin is warm and dry.  Psychiatric: She has a normal mood and affect.   Labs on Admission: I have personally reviewed following labs and imaging studies  CBC: Recent Labs  Lab 12/14/17 1056  WBC 5.8  HGB 14.9  HCT 44.0  MCV 91.5  PLT 073   Basic Metabolic Panel: Recent Labs  Lab 12/14/17 1056  NA 137   K 3.5  CL 108  CO2 20*  GLUCOSE 109*  BUN 19  CREATININE 1.10*  CALCIUM 9.4   GFR: Estimated Creatinine Clearance: 38.5 mL/min (A) (by C-G formula based on SCr of 1.1 mg/dL (H)). Liver Function Tests: No results for input(s): AST, ALT, ALKPHOS, BILITOT, PROT, ALBUMIN in the last 168 hours. No results for input(s): LIPASE, AMYLASE in the last 168 hours. No results for input(s): AMMONIA in the last 168 hours. Coagulation Profile: No results for input(s): INR, PROTIME in the last 168 hours. Cardiac Enzymes: No results for input(s): CKTOTAL, CKMB, CKMBINDEX, TROPONINI in the last 168 hours. BNP (last 3 results) No results for input(s): PROBNP in the last 8760 hours. HbA1C: No results for input(s): HGBA1C in the last 72 hours. CBG: No results for input(s): GLUCAP in the last 168 hours. Lipid  Profile: No results for input(s): CHOL, HDL, LDLCALC, TRIG, CHOLHDL, LDLDIRECT in the last 72 hours. Thyroid Function Tests: No results for input(s): TSH, T4TOTAL, FREET4, T3FREE, THYROIDAB in the last 72 hours. Anemia Panel: No results for input(s): VITAMINB12, FOLATE, FERRITIN, TIBC, IRON, RETICCTPCT in the last 72 hours. Urine analysis:    Component Value Date/Time   COLORURINE YELLOW 12/14/2017 1706   APPEARANCEUR CLEAR 12/14/2017 1706   LABSPEC 1.015 12/14/2017 1706   PHURINE 6.0 12/14/2017 1706   GLUCOSEU NEGATIVE 12/14/2017 1706   HGBUR SMALL (A) 12/14/2017 1706   BILIRUBINUR NEGATIVE 12/14/2017 1706   BILIRUBINUR negative 05/22/2016 0932   KETONESUR NEGATIVE 12/14/2017 1706   PROTEINUR NEGATIVE 12/14/2017 1706   UROBILINOGEN 0.2 05/22/2016 0932   NITRITE NEGATIVE 12/14/2017 1706   LEUKOCYTESUR TRACE (A) 12/14/2017 1706    Radiological Exams on Admission: Ct Head Wo Contrast  Result Date: 12/14/2017 CLINICAL DATA:  Multiple short syncopal episodes this morning, history hypertension, dementia EXAM: CT HEAD WITHOUT CONTRAST TECHNIQUE: Contiguous axial images were obtained from  the base of the skull through the vertex without intravenous contrast. Sagittal and coronal MPR images reconstructed from axial data set. COMPARISON:  None FINDINGS: Brain: Normal ventricular morphology. No midline shift or mass effect. Normal appearance of brain parenchyma. No intracranial hemorrhage, mass lesion, evidence of acute infarction, or extra-axial fluid collection. Vascular: Mild atherosclerotic calcification of internal carotid arteries bilaterally at skull base Skull: Demineralized but intact Sinuses/Orbits: Partial opacification and air-fluid levels of the frontal sinus. Minimally mucosal thickening in a few ethmoid air cells bilaterally. Remaining visualized paranasal sinuses and mastoid air cells clear Other: N/A IMPRESSION: Atrophy with small vessel chronic ischemic changes of deep cerebral white matter. No acute intracranial abnormalities. Frontal sinus disease changes. Electronically Signed   By: Lavonia Dana M.D.   On: 12/14/2017 13:22   Dg Chest Port 1 View  Result Date: 12/14/2017 CLINICAL DATA:  Syncope, weakness EXAM: PORTABLE CHEST 1 VIEW COMPARISON:  06/05/2016 FINDINGS: Heart and mediastinal contours are within normal limits. No focal opacities or effusions. No acute bony abnormality. IMPRESSION: No active disease. Electronically Signed   By: Rolm Baptise M.D.   On: 12/14/2017 12:49    EKG: Independently reviewed.  Normal sinus rhythm (heart rate 88).  Assessment/Plan Principal Problem:   Syncope Active Problems:   HTN (hypertension)   Dementia ([facility])   Syncope Concern for possible cardiac etiology (arrhythmia) as patient did not have a prodrome.  Seizure less likely as she remained unconscious for 5 minutes and did not seem confused upon waking up per family.  In addition, family did not notice any tongue biting, jerking motions, or incontinence.  Vitals stable on arrival.  Blood glucose normal.  Renal function at baseline.  No leukocytosis.  UA not suggestive of  infection.  Troponin negative and EKG not suggestive of ACS.  Chest x-ray showing no active disease. Head CT showing atrophy with small vessel chronic ischemic changes of deep cerebral white matter and no acute intracranial abnormalities. -Monitor on telemetry -Echocardiogram to rule out structural heart disease -PT consult -Check orthostatics -Carotid dopplers  -Consider getting EEG if work-up mentioned above is negative.  Alzheimer's dementia Continue home donezepil and memantine  Hypertension Currently not on any home medications.  Blood pressure normal on admission. -Continue to monitor   DVT prophylaxis: Lovenox Code Status: Patient wishes to be full code. Family Communication: 2 daughters at bedside have been updated. Disposition Plan: Anticipate discharge in 1 to 2 days; pending echocardiogram and carotid  dopplers Consults called: None Admission status: Observation   Shela Leff MD [facility] Pager [phone]  If 7PM-7AM, please contact night-coverage [email or web address] Password [email or web address]  12/14/2017, 9:55 PM

## 2017-12-14 NOTE — ED Triage Notes (Signed)
pts daughter states that patient had syncopal episode with loc lasting about 5 mins this am when she walked her to the bathroom, pt has hx of alzheimers but daughter states she is at her baseline currently. Denies use of blood thinners

## 2017-12-15 ENCOUNTER — Observation Stay (HOSPITAL_BASED_OUTPATIENT_CLINIC_OR_DEPARTMENT_OTHER): Payer: Self-pay

## 2017-12-15 DIAGNOSIS — R55 Syncope and collapse: Secondary | ICD-10-CM

## 2017-12-15 DIAGNOSIS — I5030 Unspecified diastolic (congestive) heart failure: Secondary | ICD-10-CM

## 2017-12-15 DIAGNOSIS — I951 Orthostatic hypotension: Secondary | ICD-10-CM

## 2017-12-15 LAB — ECHOCARDIOGRAM COMPLETE
Height: 63 in
Weight: 2268.8 oz

## 2017-12-15 LAB — GLUCOSE, CAPILLARY: GLUCOSE-CAPILLARY: 98 mg/dL (ref 70–99)

## 2017-12-15 MED ORDER — SODIUM CHLORIDE 0.9 % IV BOLUS
1000.0000 mL | Freq: Once | INTRAVENOUS | Status: AC
  Administered 2017-12-15: 1000 mL via INTRAVENOUS

## 2017-12-15 NOTE — Evaluation (Signed)
Physical Therapy Evaluation Patient Details Name: Kara Dennis MRN: [id number] DOB: [birth year] Today's Date: 12/15/2017   History of Present Illness  Patient is a 77 y/o female presenting to [facility] ED on 12/14/17 due to syncope. Past medical history significant of bradycardia in 2014, cervical cancer status post hysterectomy, dementia, hypertension. Head CT showing atrophy with small vessel chronic ischemic changes of deep cerebral white matter and no acute intracranial abnormalities. Admitted for further work-up.    Clinical Impression  Ms. Hesser is a very pleasant 77 y/o female admitted with the above listed diagnosis. Patients daughter at bedside along with interpreter who assisted in session. Daughter provides PLOF as she reports her mother is a poor historian. Reports she was Mod I with mobility and ADLs prior to admission, lives with her daughter who is able to provide assistance as needed. Patient today performing bed mobility, transfers, and gait at general min guard level for safety and balance, but without need for physical assistance. Orthostatics assessed in session with noted decline in readings, however patient asymptomatic. PT to follow acutely to maximize safe and independent functional mobility.   BP supine: 146/73 HR supine: 71  BP sitting: 138/69 HR sitting: 76  BP standing: 126/71 HR standing: 88  BP standing 3 min: 119/67 HR standing 3 min: 84    Follow Up Recommendations No PT follow up;Supervision - Intermittent    Equipment Recommendations  None recommended by PT    Recommendations for Other Services       Precautions / Restrictions Precautions Precautions: Fall Restrictions Weight Bearing Restrictions: No      Mobility  Bed Mobility Overal bed mobility: Needs Assistance Bed Mobility: Supine to Sit     Supine to sit: Supervision     General bed mobility comments: for safety  Transfers Overall transfer level: Needs assistance Equipment used:  None Transfers: Sit to/from Stand Sit to Stand: Min guard;Supervision         General transfer comment: for safety and immediate standing balance  Ambulation/Gait Ambulation/Gait assistance: Min guard Gait Distance (Feet): 15 Feet Assistive device: None Gait Pattern/deviations: Step-through pattern;Decreased stride length Gait velocity: decreased   General Gait Details: limited mobility as transport arrived to take patient to vascular  Stairs            Wheelchair Mobility    Modified Rankin (Stroke Patients Only)       Balance Overall balance assessment: Needs assistance Sitting-balance support: No upper extremity supported;Feet unsupported Sitting balance-Leahy Scale: Good     Standing balance support: No upper extremity supported;During functional activity Standing balance-Leahy Scale: Fair Standing balance comment: min guard to close supervision for safety                             Pertinent Vitals/Pain Pain Assessment: No/denies pain    Home Living Family/patient expects to be discharged to:: Private residence Living Arrangements: Children Available Help at Discharge: Family Type of Home: House Home Access: Stairs to enter Entrance Stairs-Rails: None Technical brewer of Steps: 4 Home Layout: Two level Home Equipment: Grab bars - tub/shower Additional Comments: daughter providing PLOF as she reports her mother is a poor historian    Prior Function Level of Independence: Independent         Comments: lives with daughter; reports she is mobile in the home independently     Hand Dominance        Extremity/Trunk Assessment   Upper Extremity Assessment  Upper Extremity Assessment: Overall WFL for tasks assessed    Lower Extremity Assessment Lower Extremity Assessment: Generalized weakness    Cervical / Trunk Assessment Cervical / Trunk Assessment: Normal  Communication   Communication: Prefers language other than  [place];Interpreter utilized  Cognition Arousal/Alertness: Awake/alert Behavior During Therapy: WFL for tasks assessed/performed Overall Cognitive Status: History of cognitive impairments - at baseline                                 General Comments: daughter reports patient is a poor historian and provides PLOF      General Comments General comments (skin integrity, edema, etc.): patient with no subjective reports of dizziness/lightheadedness wtih mobility    Exercises     Assessment/Plan    PT Assessment Patient needs continued PT services  PT Problem List Decreased strength;Decreased activity tolerance;Decreased balance;Decreased mobility;Decreased safety awareness;Decreased knowledge of use of DME       PT Treatment Interventions Gait training;Stair training;Functional mobility training;Therapeutic activities;Therapeutic exercise;Balance training;Patient/family education    PT Goals (Current goals can be found in the Care Plan section)  Acute Rehab PT Goals Patient Stated Goal: none stated PT Goal Formulation: With patient/family Time For Goal Achievement: 12/29/17 Potential to Achieve Goals: Good    Frequency Min 3X/week   Barriers to discharge        Co-evaluation               AM-PAC PT "6 Clicks" Daily Activity  Outcome Measure Difficulty turning over in bed (including adjusting bedclothes, sheets and blankets)?: A Little Difficulty moving from lying on back to sitting on the side of the bed? : A Little Difficulty sitting down on and standing up from a chair with arms (e.g., wheelchair, bedside commode, etc,.)?: Unable Help needed moving to and from a bed to chair (including a wheelchair)?: A Little Help needed walking in hospital room?: A Little Help needed climbing 3-5 steps with a railing? : A Little 6 Click Score: 16    End of Session Equipment Utilized During Treatment: Gait belt Activity Tolerance: Patient tolerated treatment  well Patient left: (in transport w/c) Nurse Communication: Mobility status PT Visit Diagnosis: Unsteadiness on feet (R26.81);Other abnormalities of gait and mobility (R26.89);History of falling (Z91.81)    Time: 4825-0037 PT Time Calculation (min) (ACUTE ONLY): 19 min   Charges:   PT Evaluation $PT Eval Moderate Complexity: 1 Mod        Lanney Gins, PT, DPT Supplemental Physical Therapist 12/15/17 11:18 AM Pager: [phone] Office: [phone]

## 2017-12-15 NOTE — Progress Notes (Signed)
  Echocardiogram 2D Echocardiogram has been performed.  Quantavia Frith G Opal Dinning 12/15/2017, 9:50 AM

## 2017-12-15 NOTE — Progress Notes (Signed)
*  Preliminary Results* Carotid artery duplex has been completed. Bilateral internal carotid arteries are 1-39%. Vertebral arteries are patent with antegrade flow.  12/15/2017 11:08 AM  Kara Dennis

## 2017-12-15 NOTE — Progress Notes (Signed)
Discharge instructions given to patient and her daughter "My". All questions answered.

## 2017-12-15 NOTE — Discharge Summary (Signed)
Physician Discharge Summary  Kazzandra Desaulniers ZOX:[id number] DOB: [birth year] DOA: 12/14/2017  PCP: Alfonse Spruce, FNP  Admit date: 12/14/2017 Discharge date: 12/15/2017  Admitted From: Home Disposition: Home  Recommendations for Outpatient Follow-up:  1. Follow up with PCP in 1-2 weeks 2. Push p.o. fluids to 1500 mL daily   Home Health: No Equipment/Devices: None  Discharge Condition: Improved CODE STATUS: Full code Diet recommendation: Regular diet  Brief/Interim Summary: Kara Dennis is a 77 y.o. female with medical history significant of bradycardia in 2014, cervical cancer status post hysterectomy, dementia, hypertension presenting to the hospital for further work-up of syncope.  Patient speaks [place] only and does not speak [place].  History was provided by her to both daughters present at the bedside.  Patient was placed in observation echocardiogram was obtained which was unremarkable, bilateral carotid show 1 to 39% stenosis, orthostatics were obtained and found to be abnormal by pulse.  She was given 1 L of normal saline and her orthostasis resolved.  Family reports that patient has difficulty remembering to drink fluids and sometimes to eat.  They sometimes have to prompt her.  I extensively educated the patient's daughter and her granddaughter regarding need for fluid monitoring.  Discussed a minimum of 1500 mL daily.  Raquel Sarna was most appreciative and interested in assisting the patient.  Patient has reached maximal benefit of hospitalization.  Discharge diagnosis, prognosis, plans, follow-up, medications and treatments discussed with the patient(or responsible party) and is in agreement with the plans as described.  Patient is stable for discharge.  Discharge Diagnoses:  Principal Problem:   Syncope due to orthostatic hypotension Active Problems:   HTN (hypertension)   Dementia (HCC)    Discharge Instructions  Discharge Instructions    Diet - low sodium heart  healthy   Complete by:  As directed    Discharge instructions   Complete by:  As directed    Drink at least eight 8 oz cups of liquid daily   Increase activity slowly   Complete by:  As directed      Allergies as of 12/15/2017   No Known Allergies     Medication List    STOP taking these medications   atorvastatin 10 MG tablet Commonly known as:  LIPITOR   azithromycin 250 MG tablet Commonly known as:  ZITHROMAX   carbamide peroxide 6.5 % OTIC solution Commonly known as:  DEBROX   fluticasone 50 MCG/ACT nasal spray Commonly known as:  FLONASE   guaiFENesin-dextromethorphan 100-10 MG/5ML syrup Commonly known as:  ROBITUSSIN DM     TAKE these medications   alendronate 70 MG tablet Commonly known as:  FOSAMAX TAKE ONE TABLET BY MOUTH EVERY 7 DAYS. TAKE WITH A FULL GLASS OF WATER ON AN EMPTY STOMACH. MUST REMAIN UPRIGHT AT LEAST 30 MINUTES AFTER TAKING. What changed:    how much to take  how to take this  when to take this   amLODipine 2.5 MG tablet Commonly known as:  NORVASC Take 1 tablet (2.5 mg total) by mouth daily.   donepezil 10 MG tablet Commonly known as:  ARICEPT Take 1 tablet (10 mg total) by mouth at bedtime.   memantine 10 MG tablet Commonly known as:  NAMENDA Take 1 tablet (10 mg total) by mouth 2 (two) times daily.      Follow-up Information    Alfonse Spruce, FNP Follow up in 1 week(s).   Specialty:  Family Medicine Why:  follow up for orthostatic syncope treated with  IVF Contact information: [place] [address] [phone]          No Known Allergies    Procedures/Studies: Ct Head Wo Contrast  Result Date: 12/14/2017 CLINICAL DATA:  Multiple short syncopal episodes this morning, history hypertension, dementia EXAM: CT HEAD WITHOUT CONTRAST TECHNIQUE: Contiguous axial images were obtained from the base of the skull through the vertex without intravenous contrast. Sagittal and coronal MPR images  reconstructed from axial data set. COMPARISON:  None FINDINGS: Brain: Normal ventricular morphology. No midline shift or mass effect. Normal appearance of brain parenchyma. No intracranial hemorrhage, mass lesion, evidence of acute infarction, or extra-axial fluid collection. Vascular: Mild atherosclerotic calcification of internal carotid arteries bilaterally at skull base Skull: Demineralized but intact Sinuses/Orbits: Partial opacification and air-fluid levels of the frontal sinus. Minimally mucosal thickening in a few ethmoid air cells bilaterally. Remaining visualized paranasal sinuses and mastoid air cells clear Other: N/A IMPRESSION: Atrophy with small vessel chronic ischemic changes of deep cerebral white matter. No acute intracranial abnormalities. Frontal sinus disease changes. Electronically Signed   By: Lavonia Dana M.D.   On: 12/14/2017 13:22   Dg Chest Port 1 View  Result Date: 12/14/2017 CLINICAL DATA:  Syncope, weakness EXAM: PORTABLE CHEST 1 VIEW COMPARISON:  06/05/2016 FINDINGS: Heart and mediastinal contours are within normal limits. No focal opacities or effusions. No acute bony abnormality. IMPRESSION: No active disease. Electronically Signed   By: Rolm Baptise M.D.   On: 12/14/2017 12:49   Bilateral carotid duplexes showed 1 to 39% stenoses  Echocardiogram:Normal LV size with moderate focal basal septal hypertrophy.   Vigorous systolic function, EF 69-48%, with LV outflow tract   turbulence. The peak measured gradient was 19 mmHg. Normal RV   size and systolic function. No significant valvular   abnormalities.    Subjective: Patient with no complaints.  She is interviewed by me through her granddaughter who translates  Discharge Exam: Vitals:   12/15/17 0017 12/15/17 0429  BP: (!) 152/71 (!) 151/79  Pulse: 75 85  Resp: 18 18  Temp: 97.8 F (36.6 C) 98.5 F (36.9 C)  SpO2: 96% 93%   Vitals:   12/14/17 2126 12/14/17 2200 12/15/17 0017 12/15/17 0429  BP: (!) 170/60  (!) 159/74 (!) 152/71 (!) 151/79  Pulse: 69 72 75 85  Resp: 16  18 18   Temp: 97.7 F (36.5 C)  97.8 F (36.6 C) 98.5 F (36.9 C)  TempSrc: Oral  Oral Oral  SpO2: 99%  96% 93%  Weight:    64.3 kg  Height:        General: Pt is alert, awake, not in acute distress Cardiovascular: RRR, S1/S2 +, no rubs, no gallops Respiratory: CTA bilaterally, no wheezing, no rhonchi Abdominal: Soft, NT, ND, bowel sounds + Extremities: no edema, no cyanosis    The results of significant diagnostics from this hospitalization (including imaging, microbiology, ancillary and laboratory) are listed below for reference.     Microbiology: No results found for this or any previous visit (from the past 240 hour(s)).   Labs: BNP (last 3 results) No results for input(s): BNP in the last 8760 hours. Basic Metabolic Panel: Recent Labs  Lab 12/14/17 1056  NA 137  K 3.5  CL 108  CO2 20*  GLUCOSE 109*  BUN 19  CREATININE 1.10*  CALCIUM 9.4   Liver Function Tests: No results for input(s): AST, ALT, ALKPHOS, BILITOT, PROT, ALBUMIN in the last 168 hours. No results for input(s): LIPASE, AMYLASE in  the last 168 hours. No results for input(s): AMMONIA in the last 168 hours. CBC: Recent Labs  Lab 12/14/17 1056  WBC 5.8  HGB 14.9  HCT 44.0  MCV 91.5  PLT 226   Cardiac Enzymes: No results for input(s): CKTOTAL, CKMB, CKMBINDEX, TROPONINI in the last 168 hours. BNP: Invalid input(s): POCBNP CBG: Recent Labs  Lab 12/15/17 0816  GLUCAP 98   D-Dimer No results for input(s): DDIMER in the last 72 hours. Hgb A1c No results for input(s): HGBA1C in the last 72 hours. Lipid Profile No results for input(s): CHOL, HDL, LDLCALC, TRIG, CHOLHDL, LDLDIRECT in the last 72 hours. Thyroid function studies No results for input(s): TSH, T4TOTAL, T3FREE, THYROIDAB in the last 72 hours.  Invalid input(s): FREET3 Anemia work up No results for input(s): VITAMINB12, FOLATE, FERRITIN, TIBC, IRON, RETICCTPCT  in the last 72 hours. Urinalysis    Component Value Date/Time   COLORURINE YELLOW 12/14/2017 1706   APPEARANCEUR CLEAR 12/14/2017 1706   LABSPEC 1.015 12/14/2017 1706   PHURINE 6.0 12/14/2017 1706   GLUCOSEU NEGATIVE 12/14/2017 1706   HGBUR SMALL (A) 12/14/2017 1706   BILIRUBINUR NEGATIVE 12/14/2017 1706   BILIRUBINUR negative 05/22/2016 0932   KETONESUR NEGATIVE 12/14/2017 1706   PROTEINUR NEGATIVE 12/14/2017 1706   UROBILINOGEN 0.2 05/22/2016 0932   NITRITE NEGATIVE 12/14/2017 1706   LEUKOCYTESUR TRACE (A) 12/14/2017 1706   Sepsis Labs Invalid input(s): PROCALCITONIN,  WBC,  LACTICIDVEN Microbiology No results found for this or any previous visit (from the past 240 hour(s)).   Time coordinating discharge: 42 minutes  SIGNED:   Lady Deutscher, MD  FACP [facility] 12/15/2017, 3:11 PM Pager   If 7PM-7AM, please contact night-coverage [email or web address] Password [email or web address]

## 2018-01-06 MED FILL — MEMANTINE HCL 10 MG TABLET: 10 | 30 days supply | Qty: 60 | Fill #2

## 2018-01-06 MED FILL — DONEPEZIL HCL 10 MG TABLET: 10 | 30 days supply | Qty: 30 | Fill #2

## 2018-01-12 ENCOUNTER — Ambulatory Visit: Payer: Self-pay | Admitting: Family Medicine

## 2018-01-20 ENCOUNTER — Encounter: Payer: Self-pay | Admitting: Family Medicine

## 2018-01-20 ENCOUNTER — Ambulatory Visit: Payer: Self-pay | Attending: Family Medicine | Admitting: Family Medicine

## 2018-01-20 VITALS — BP 105/69 | HR 85 | Resp 20 | Ht 61.0 in | Wt 144.2 lb

## 2018-01-20 DIAGNOSIS — M810 Age-related osteoporosis without current pathological fracture: Secondary | ICD-10-CM

## 2018-01-20 DIAGNOSIS — Z79899 Other long term (current) drug therapy: Secondary | ICD-10-CM

## 2018-01-20 DIAGNOSIS — I10 Essential (primary) hypertension: Secondary | ICD-10-CM

## 2018-01-20 DIAGNOSIS — Z833 Family history of diabetes mellitus: Secondary | ICD-10-CM | POA: Insufficient documentation

## 2018-01-20 DIAGNOSIS — Z23 Encounter for immunization: Secondary | ICD-10-CM

## 2018-01-20 DIAGNOSIS — Z90710 Acquired absence of both cervix and uterus: Secondary | ICD-10-CM | POA: Insufficient documentation

## 2018-01-20 DIAGNOSIS — Z8541 Personal history of malignant neoplasm of cervix uteri: Secondary | ICD-10-CM | POA: Insufficient documentation

## 2018-01-20 DIAGNOSIS — F0390 Unspecified dementia without behavioral disturbance: Secondary | ICD-10-CM

## 2018-01-20 MED ORDER — MEMANTINE HCL 10 MG PO TABS: 10.0000 mg | ORAL_TABLET | Freq: Two times a day (BID) | ORAL | 1 refills | Status: DC

## 2018-01-20 MED ORDER — DONEPEZIL HCL 10 MG PO TABS: 10.0000 mg | ORAL_TABLET | Freq: Every day | ORAL | 1 refills | Status: DC

## 2018-01-20 NOTE — Progress Notes (Signed)
Subjective:    Patient ID: Kara Dennis, female    DOB: [birth year], 77 y.o.   MRN: [id number]  HPI       77 year old female who is being seen to reestablish care status post hospitalization 12/14/2017 for syncope.  Per ED notes, patient passed out in the bathroom while brushing her teeth.  Patient's daughter was with her in the bathroom at the time and assisted the patient to the floor and patient was unconscious for about 5 minutes.  Family reported that this had never happened to the patient before.  Prior to hospitalization, patient was only on medication for dementia.  Patient had a past history of hypertension and bradycardia as well as a history of cervical cancer which was treated with hysterectomy.  Patient has not been on blood pressure medicine in some time prior to hospitalization but was discharged with prescription for amlodipine 2.5 mg as well as a prescription for Fosamax.   Patient is seen today in clinic and is accompanied by her grandson who acts as her interpreter. Per her grandson, patient is only on two medications currently, just the medications to help with her dementia. Patient is no longer on blood pressure medication. Patient is not taking the fosamax.  Grandson reports that his grandmother spends time living with 1 of her 3 daughters who provide care.  He states that his grandmother has a good appetite, appears to sleep well and does not have any issues with irritability or agitation.  Patient's grandmother does not wander.  Other than the syncopal episode, patient has had no falls.      Patient review of systems that was asked by the grandson reports no issues at today's visit.  Patient denies any headaches or dizziness, no chest pain, no shortness of breath or cough.  Patient denies any recent fever or chills.  Patient denies any muscle or joint pain.  Patient has had no abdominal pain, no urinary frequency or dysuria and no constipation or diarrhea. Past Medical History:    Diagnosis Date  . Bradycardia 2014    identified before hysterectomy, treated for short time with medication before during and after hysterectomy   . Cervical cancer ([place]) 2014    s/p hysterectomy, no chemo or radiation   . Dementia ([place])   . HTN (hypertension) 2013   took some medicine in [place]   . Past Surgical History:  Procedure Laterality Date  . ABDOMINAL HYSTERECTOMY  05/2012    Family History  Problem Relation Age of Onset  . Diabetes Brother   . Heart disease Neg Hx   . Cancer Neg Hx   . Dementia Neg Hx   . Breast cancer Neg Hx    Social History   Tobacco Use  . Smoking status: Never Smoker  . Smokeless tobacco: Never Used  Substance Use Topics  . Alcohol use: No    Alcohol/week: 0.0 standard drinks  . Drug use: No  No Known Allergies    Review of Systems  Reason unable to perform ROS: patient with dementia and denies any issues but not sure if her responses are accurate.  Constitutional: Negative for chills, fatigue and fever.  HENT: Negative for dental problem and trouble swallowing.   Respiratory: Negative for cough and shortness of breath.   Cardiovascular: Negative for chest pain, palpitations and leg swelling.  Gastrointestinal: Negative for abdominal pain, constipation, diarrhea and nausea.  Genitourinary: Negative for dysuria and frequency.  Musculoskeletal: Negative for arthralgias, back pain, gait problem  and joint swelling.  Neurological: Negative for dizziness and headaches.       Objective:   Physical Exam  Constitutional: She appears well-developed and well-nourished.  HENT:  Right Ear: Tympanic membrane, external ear and ear canal normal.  Left Ear: Tympanic membrane, external ear and ear canal normal.  Nose: Nose normal.  Mouth/Throat: Oropharynx is clear and moist. She has dentures.  Eyes: Conjunctivae and EOM are normal.  Neck: Normal range of motion. Neck supple. No thyromegaly present.  Cardiovascular: Normal rate, regular  rhythm and normal heart sounds.  Pulmonary/Chest: Effort normal and breath sounds normal.  Abdominal: Soft. Bowel sounds are normal. There is no tenderness.  Musculoskeletal: Normal range of motion. She exhibits no edema.  No CVA tenderness and no tenderness with palpation over the cervical, thoracic and lumbar spine  Lymphadenopathy:    She has no cervical adenopathy.  Neurological: She is alert. No cranial nerve deficit.  Nursing note and vitals reviewed.  BP 105/69 (BP Location: Right Arm, Patient Position: Sitting, Cuff Size: Normal)   Pulse 85   Resp 20   Ht 5\' 1"  (1.549 m)   Wt 144 lb 3.2 oz (65.4 kg)   BMI 27.25 kg/m         Assessment & Plan:  1. Essential hypertension Patient's blood pressure today's visit is 105/69.  Patient was discharged from the hospital with prescription for amlodipine 2.5 mg which patient's family did not have patient start.  Patient does not appear to need blood pressure medication at this time but family should have her blood pressure checked periodically.  Patient at this time should follow a low-sodium diet, maintain a healthy weight and no medication for control blood pressure is needed at this time.  Patient will have BMP done in follow-up of recent hospitalization with syncopal episode which was thought to be secondary to dehydration. - Basic Metabolic Panel  2. Osteoporosis without current pathological fracture, unspecified osteoporosis type Patient with a history of osteoporosis and was prescribed fosamax during her recent hospitalization but family has decided not to continue this medication. Discussed with her grandson that medication was prescribed to help decrease patient's risk of fracture. Weight bearing exercise and diet rich in calcium and Vitamin D encouraged.  - Basic Metabolic Panel  3. Dementia without behavioral disturbance, unspecified dementia type ([place]) Grandson reports that patient does not have any issues with agitation or  irritability and she is pleasant. No wandering behaviors. Continue the use of aricept and namenda at this time.  Patient appears to be stable on her current medications.  Discussed with the grandson that if patient is having increased agitation or behavioral changes, she does need to be evaluated in the office or at the emergency department/urgent care as patients with dementia may not have any other symptoms other than behavioral changes when they have illnesses such as pneumonia or urinary tract infection as they may not be able to express that they are not feeling well.  Patient will have BMP at today's visit in follow-up of recent syncopal episode and dehydration as well as long-term use of medications. - Basic Metabolic Panel - donepezil (ARICEPT) 10 MG tablet; Take 1 tablet (10 mg total) by mouth at bedtime.  Dispense: 90 tablet; Refill: 1 - memantine (NAMENDA) 10 MG tablet; Take 1 tablet (10 mg total) by mouth 2 (two) times daily.  Dispense: 180 tablet; Refill: 1  4. Encounter for long-term (current) use of medications Patient will have BMP in follow-up of recent hospitalization  due to syncope/dehydration and in follow-up of medication use.  - Basic Metabolic Panel  5. Need for immunization against influenza With son was asked if the patient would like to have flu vaccine.  Grandson contacted the patient's daughter who wanted patient to have flu immunization at today's visit.  Patient also agreed to receive immunization. - Flu Vaccine QUAD 36+ mos IM  An After Visit Summary was printed and given to the patient.  Return in about 4 months (around 05/21/2018) for HTN.

## 2018-01-20 NOTE — Patient Instructions (Signed)

## 2018-01-21 LAB — BASIC METABOLIC PANEL WITH GFR
BUN/Creatinine Ratio: 17 (ref 12–28)
BUN: 22 mg/dL (ref 8–27)
CO2: 20 mmol/L (ref 20–29)
Calcium: 9.7 mg/dL (ref 8.7–10.3)
Chloride: 105 mmol/L (ref 96–106)
Creatinine, Ser: 1.28 mg/dL — ABNORMAL HIGH (ref 0.57–1.00)
GFR calc Af Amer: 47 mL/min/1.73 — ABNORMAL LOW
GFR calc non Af Amer: 40 mL/min/1.73 — ABNORMAL LOW
Glucose: 91 mg/dL (ref 65–99)
Potassium: 4 mmol/L (ref 3.5–5.2)
Sodium: 141 mmol/L (ref 134–144)

## 2018-02-03 ENCOUNTER — Ambulatory Visit: Payer: Self-pay | Attending: Family Medicine

## 2018-02-10 MED FILL — MEMANTINE HCL 10 MG TABLET: 10 | 30 days supply | Qty: 60 | Fill #3

## 2018-02-10 MED FILL — DONEPEZIL HCL 10 MG TABLET: 10 | 30 days supply | Qty: 30 | Fill #3

## 2018-04-07 ENCOUNTER — Ambulatory Visit: Payer: Self-pay | Admitting: Adult Health

## 2018-06-18 ENCOUNTER — Telehealth: Payer: Self-pay | Admitting: Neurology

## 2018-06-18 NOTE — Telephone Encounter (Signed)
error 

## 2018-07-08 ENCOUNTER — Ambulatory Visit: Payer: Self-pay | Admitting: Adult Health

## 2018-09-14 ENCOUNTER — Other Ambulatory Visit: Payer: Self-pay

## 2018-09-14 ENCOUNTER — Ambulatory Visit: Payer: Self-pay | Attending: Family Medicine

## 2018-09-15 ENCOUNTER — Telehealth: Payer: Self-pay | Admitting: Neurology

## 2018-09-15 NOTE — Telephone Encounter (Signed)
I called her daughter, Salli Real.  They are needing a letter stating that pt has ALZ dementia and is not able to make decisions.  She states they have HCPOA ?, but is needing this for her lawyer, (to apply for medicare). Last seen 09-23-2017 here.  I said since last seen one year ago will need to come in for appt.  She stated that since no insurance trying to get this prior to md appts.  I will send message.

## 2018-09-15 NOTE — Telephone Encounter (Signed)
Patient and daughter came in today without an appointment and were requesting some sort of documentation or letter. Daughter states that it would be for Medicare and the patient is currently without insurance. I am not entirely sure on what the nature of the letter needs to be but I advised patient's daughter that I would give this info to provider.

## 2018-09-15 NOTE — Telephone Encounter (Signed)
Kara Dennis can you follow up with this?

## 2018-09-15 NOTE — Telephone Encounter (Signed)
I will have to do a visit before I can write a letter unless she can obtain it from her PCP if they have seen her recently.

## 2018-09-16 ENCOUNTER — Encounter: Payer: Self-pay | Admitting: *Deleted

## 2018-09-16 ENCOUNTER — Other Ambulatory Visit: Payer: Self-pay

## 2018-09-16 ENCOUNTER — Ambulatory Visit (INDEPENDENT_AMBULATORY_CARE_PROVIDER_SITE_OTHER): Payer: Self-pay | Admitting: Adult Health

## 2018-09-16 ENCOUNTER — Encounter: Payer: Self-pay | Admitting: Adult Health

## 2018-09-16 VITALS — BP 149/85 | HR 97 | Temp 97.5°F | Ht 61.0 in | Wt 147.0 lb

## 2018-09-16 DIAGNOSIS — F0280 Dementia in other diseases classified elsewhere without behavioral disturbance: Secondary | ICD-10-CM

## 2018-09-16 DIAGNOSIS — G301 Alzheimer's disease with late onset: Secondary | ICD-10-CM

## 2018-09-16 NOTE — Progress Notes (Signed)
Made any corrections needed, and agree with history, physical, neuro exam,assessment and plan as stated.     Juvencio Verdi, MD [facility]  

## 2018-09-16 NOTE — Telephone Encounter (Signed)
I called daughter, Salli Real.  Made appt today at 69 with Jinny Blossom, NP to address ALZ Dementia, and letter.  She verbalized understanding.  I called intrepreting services to let them know as well. (LMVM).  Instructed to wear mask, will be asked covid 19 screening questions, only one family member back with pt.  She verbalized understanding.

## 2018-09-16 NOTE — Progress Notes (Signed)
PATIENT: Kara Dennis DOB: 19-Jan-1941  REASON FOR VISIT: follow up HISTORY FROM: patient  HISTORY OF PRESENT ILLNESS: Today 09/16/18:  Kara Dennis is a 78 year old female with a history of Alzheimer's dementia. She returns today for follow-up.  She is here today with an interpreter.  The patient reports that her memory has remained the same.  However her daughter reports that she requires assistance with all ADLs.  She lives with family.  They help her with her medication and finances.  The patient's daughter needs a letter stating that the patient cannot make decisions on her own if we agree with that.  She remains on Aricept and Namenda.  She tolerates both medications well.  She returns today for evaluation.  HISTORY 09/23/17:  Kara Dennis is a 78 year old female with a history of Alzheimer's dementia.  She returns today for follow-up.  She continues on Aricept and Namenda.  She tolerates both medications well.  She lives with her son.  She requires assistance with ADLs.  No change in her mood or behavior.  Denies any trouble sleeping.  Reports good appetite.  She returns today for evaluation.  REVIEW OF SYSTEMS: Out of a complete 14 system review of symptoms, the patient complains only of the following symptoms, and all other reviewed systems are negative.  See HPI  ALLERGIES: No Known Allergies  HOME MEDICATIONS: Outpatient Medications Prior to Visit  Medication Sig Dispense Refill  . donepezil (ARICEPT) 10 MG tablet Take 1 tablet (10 mg total) by mouth at bedtime. 90 tablet 1  . memantine (NAMENDA) 10 MG tablet Take 1 tablet (10 mg total) by mouth 2 (two) times daily. 180 tablet 1   No facility-administered medications prior to visit.     PAST MEDICAL HISTORY: Past Medical History:  Diagnosis Date  . Bradycardia 2014    identified before hysterectomy, treated for short time with medication before during and after hysterectomy   . Cervical cancer ([place]) 2014    s/p  hysterectomy, no chemo or radiation   . Dementia ([place])   . HTN (hypertension) 2013   took some medicine in [place]     PAST SURGICAL HISTORY: Past Surgical History:  Procedure Laterality Date  . ABDOMINAL HYSTERECTOMY  05/2012     FAMILY HISTORY: Family History  Problem Relation Age of Onset  . Diabetes Brother   . Heart disease Neg Hx   . Cancer Neg Hx   . Dementia Neg Hx   . Breast cancer Neg Hx     SOCIAL HISTORY: Social History   Socioeconomic History  . Marital status: Widowed    Spouse name: Not on file  . Number of children: 6   . Years of education: 3  . Highest education level: Not on file  Occupational History  . Occupation: Agricultural engineer   Social Needs  . Financial resource strain: Not on file  . Food insecurity    Worry: Not on file    Inability: Not on file  . Transportation needs    Medical: Not on file    Non-medical: Not on file  Tobacco Use  . Smoking status: Never Smoker  . Smokeless tobacco: Never Used  Substance and Sexual Activity  . Alcohol use: No    Alcohol/week: 0.0 standard drinks  . Drug use: No  . Sexual activity: Never    Birth control/protection: None  Lifestyle  . Physical activity    Days per week: Not on file    Minutes  per session: Not on file  . Stress: Not on file  Relationships  . Social Herbalist on phone: Not on file    Gets together: Not on file    Attends religious service: Not on file    Active member of club or organization: Not on file    Attends meetings of clubs or organizations: Not on file    Relationship status: Not on file  . Intimate partner violence    Fear of current or ex partner: Not on file    Emotionally abused: Not on file    Physically abused: Not on file    Forced sexual activity: Not on file  Other Topics Concern  . Not on file  Social History Narrative   Lives with daughter, [place], [place].    Moved from [place] in 05/2013.   Has 6 children total.   4 in [place]   1 in [place]   1  in [place]       Has many grandchildren. 17 total.    Right-handed   Caffeine: 1 cup every other day      PHYSICAL EXAM  Vitals:   09/16/18 1246  BP: (!) 149/85  Pulse: 97  Temp: (!) 97.5 F (36.4 C)  Weight: 147 lb (66.7 kg)  Height: 5\' 1"  (1.549 m)   Body mass index is 27.78 kg/m.   MMSE - Mini Mental State Exam 09/16/2018 09/02/2016 05/30/2016  Orientation to time 0 0 0  Orientation to time comments - - -  Orientation to Place 0 0 1  Orientation to Place-comments - - -  Registration 3 3 3   Attention/ Calculation 0 5 1  Recall 0 0 0  Language- name 2 objects 0 2 2  Language- repeat 0 1 0  Language- follow 3 step command 3 2 3   Language- read & follow direction 0 1 1  Write a sentence 1 0 1  Copy design 0 1 1  Total score 7 15 13      Generalized: Well developed, in no acute distress   Neurological examination  Mentation: Alert. Follows all commands speech and language fluent Cranial nerve II-XII: Pupils were equal round reactive to light. Extraocular movements were full, visual field were full on confrontational test. Facial sensation and strength were normal. Uvula tongue midline. Head turning and shoulder shrug  were normal and symmetric. Motor: The motor testing reveals 5 over 5 strength of all 4 extremities. Good symmetric motor tone is noted throughout.  Sensory: Sensory testing is intact to soft touch on all 4 extremities. No evidence of extinction is noted.  Gait and station: Gait is normal.  DIAGNOSTIC DATA (LABS, IMAGING, TESTING) - I reviewed patient records, labs, notes, testing and imaging myself where available.  Lab Results  Component Value Date   WBC 5.8 12/14/2017   HGB 14.9 12/14/2017   HCT 44.0 12/14/2017   MCV 91.5 12/14/2017   PLT 226 12/14/2017      Component Value Date/Time   NA 141 01/20/2018 1229   K 4.0 01/20/2018 1229   CL 105 01/20/2018 1229   CO2 20 01/20/2018 1229   GLUCOSE 91 01/20/2018 1229   GLUCOSE 109 (H) 12/14/2017  1056   BUN 22 01/20/2018 1229   CREATININE 1.28 (H) 01/20/2018 1229   CREATININE 0.85 01/28/2014 1158   CALCIUM 9.7 01/20/2018 1229   PROT 7.2 02/13/2017 0905   ALBUMIN 4.2 02/13/2017 0905   AST 19 02/13/2017 0905   ALT 10 02/13/2017  0905   ALKPHOS 104 02/13/2017 0905   BILITOT 0.4 02/13/2017 0905   GFRNONAA 40 (L) 01/20/2018 1229   GFRNONAA 68 01/28/2014 1158   GFRAA 47 (L) 01/20/2018 1229   GFRAA 79 01/28/2014 1158   Lab Results  Component Value Date   CHOL 128 02/13/2017   HDL 49 02/13/2017   LDLCALC 55 02/13/2017   TRIG 120 02/13/2017   CHOLHDL 2.6 02/13/2017   Lab Results  Component Value Date   HGBA1C 5.5 03/31/2017   Lab Results  Component Value Date   VAPOLIDC30 131 05/30/2016   Lab Results  Component Value Date   TSH 0.964 11/14/2016      ASSESSMENT AND PLAN 78 y.o. year old female  has a past medical history of Bradycardia (2014 ), Cervical cancer ([place]) (2014 ), Dementia ([place]), and HTN (hypertension) (2013). here with:  1.  Alzheimer's dementia  The patient's memory score has declined since last visit.  She will remain on Aricept and Namenda.  I do feel that the patient is not capable of making decisions independently.  I have advised that if her symptoms worsen or she develops new symptoms she should let us know.  She will follow-up in 6 months or sooner if needed.     I spent 15 minutes with the patient. 50% of this time was spent reviewing her memory score   Ward Givens, MSN, NP-C 09/16/2018, 1:00 PM [facility] [address] [place], [place] [address] [phone]

## 2018-09-21 MED FILL — MEMANTINE HCL 10 MG TABLET: 10 | 30 days supply | Qty: 60 | Fill #4

## 2018-09-21 MED FILL — DONEPEZIL HCL 10 MG TABLET: 10 | 30 days supply | Qty: 30 | Fill #4

## 2018-10-14 ENCOUNTER — Ambulatory Visit: Payer: Self-pay | Admitting: Family Medicine

## 2018-11-05 ENCOUNTER — Ambulatory Visit: Payer: Self-pay | Admitting: Family Medicine

## 2018-11-11 ENCOUNTER — Ambulatory Visit: Payer: Self-pay | Admitting: Family Medicine

## 2018-11-19 ENCOUNTER — Encounter: Payer: Self-pay | Admitting: Family Medicine

## 2018-11-19 ENCOUNTER — Ambulatory Visit: Payer: Medicaid Other | Attending: Family Medicine | Admitting: Family Medicine

## 2018-11-19 ENCOUNTER — Other Ambulatory Visit: Payer: Self-pay

## 2018-11-19 VITALS — BP 151/90 | HR 96 | Temp 98.0°F | Ht 61.0 in | Wt 144.0 lb

## 2018-11-19 DIAGNOSIS — Z87898 Personal history of other specified conditions: Secondary | ICD-10-CM | POA: Diagnosis not present

## 2018-11-19 DIAGNOSIS — Z79899 Other long term (current) drug therapy: Secondary | ICD-10-CM

## 2018-11-19 DIAGNOSIS — Z789 Other specified health status: Secondary | ICD-10-CM

## 2018-11-19 DIAGNOSIS — F0280 Dementia in other diseases classified elsewhere without behavioral disturbance: Secondary | ICD-10-CM | POA: Diagnosis not present

## 2018-11-19 DIAGNOSIS — E785 Hyperlipidemia, unspecified: Secondary | ICD-10-CM

## 2018-11-19 DIAGNOSIS — F0390 Unspecified dementia without behavioral disturbance: Secondary | ICD-10-CM | POA: Diagnosis not present

## 2018-11-19 DIAGNOSIS — I10 Essential (primary) hypertension: Secondary | ICD-10-CM

## 2018-11-19 DIAGNOSIS — Z603 Acculturation difficulty: Secondary | ICD-10-CM

## 2018-11-19 DIAGNOSIS — G309 Alzheimer's disease, unspecified: Secondary | ICD-10-CM | POA: Insufficient documentation

## 2018-11-19 DIAGNOSIS — M810 Age-related osteoporosis without current pathological fracture: Secondary | ICD-10-CM | POA: Diagnosis not present

## 2018-11-19 DIAGNOSIS — Z8249 Family history of ischemic heart disease and other diseases of the circulatory system: Secondary | ICD-10-CM | POA: Diagnosis not present

## 2018-11-19 DIAGNOSIS — Z758 Other problems related to medical facilities and other health care: Secondary | ICD-10-CM

## 2018-11-19 DIAGNOSIS — Z23 Encounter for immunization: Secondary | ICD-10-CM

## 2018-11-19 MED ORDER — AMLODIPINE BESYLATE 2.5 MG PO TABS: 2.5000 mg | ORAL_TABLET | Freq: Every day | ORAL | 5 refills | Status: DC

## 2018-11-19 MED FILL — ?AMLODIPINE BESYLATE 2.5MG.: 2.5 | 30 days supply | Qty: 30 | Fill #0

## 2018-11-19 NOTE — Progress Notes (Signed)
Patient states that BP medication makes her very tired.  Patient has not been taking BP medication.

## 2018-11-19 NOTE — Progress Notes (Signed)
Established Patient Office Visit  Subjective:  Patient ID: Kara Dennis, female    DOB: 1941-02-07  Age: 78 y.o. MRN: [id number]   Due to a language barrier, Stratus video interpretation system is used at today's visit and also some help from patient's daughter who accompanied patient to today's visit.  CC:  Chief Complaint  Patient presents with  . Hypertension    HPI Kara Dennis, 78 year old [place] female, who is seen in follow-up of chronic medical issues including hypertension, dementia, history of cervical cancer status post hysterectomy, history of osteoporosis, history of prediabetes and daughter reports a patient with history of hyperlipidemia as well.  Patient is currently only on medication to help with dementia.  Daughter reports that her mother has been doing well but stopped taking her blood pressure medicine a few months ago because the medication made her feel tired.  At today's visit, patient denies any complaints or concerns.  Daughter reports that patient's appetite remains good and that patient sleeps well.  Upon questioning, daughter states that patient does tend to go to the restroom multiple times per day.  She does not think that patient is having any diarrhea or constipation but is likely having more frequent urination.      At today's visit, patient denies any headache or dizziness, no chest pain or palpitations and she denies any shortness of breath or cough.  She denies any abdominal pain and no back pain.  She has had no fever or chills.  Daughter denies any disruptive behavior.  Patient has had no issues with wandering/leaving the home unsupervised.  Per daughter, this did happen a few years ago when patient was initially diagnosed with dementia.  Past Medical History:  Diagnosis Date  . Bradycardia 2014    identified before hysterectomy, treated for short time with medication before during and after hysterectomy   . Cervical cancer ([place]) 2014    s/p  hysterectomy, no chemo or radiation   . Dementia ([place])   . HTN (hypertension) 2013   took some medicine in [place]     Past Surgical History:  Procedure Laterality Date  . ABDOMINAL HYSTERECTOMY  05/2012     Family History  Problem Relation Age of Onset  . Diabetes Brother   . Heart disease Neg Hx   . Cancer Neg Hx   . Dementia Neg Hx   . Breast cancer Neg Hx     Social History   Socioeconomic History  . Marital status: Widowed    Spouse name: Not on file  . Number of children: 6   . Years of education: 3  . Highest education level: Not on file  Occupational History  . Occupation: Agricultural engineer   Social Needs  . Financial resource strain: Not on file  . Food insecurity    Worry: Not on file    Inability: Not on file  . Transportation needs    Medical: Not on file    Non-medical: Not on file  Tobacco Use  . Smoking status: Never Smoker  . Smokeless tobacco: Never Used  Substance and Sexual Activity  . Alcohol use: No    Alcohol/week: 0.0 standard drinks  . Drug use: No  . Sexual activity: Never    Birth control/protection: None  Lifestyle  . Physical activity    Days per week: Not on file    Minutes per session: Not on file  . Stress: Not on file  Relationships  . Social connections  Talks on phone: Not on file    Gets together: Not on file    Attends religious service: Not on file    Active member of club or organization: Not on file    Attends meetings of clubs or organizations: Not on file    Relationship status: Not on file  . Intimate partner violence    Fear of current or ex partner: Not on file    Emotionally abused: Not on file    Physically abused: Not on file    Forced sexual activity: Not on file  Other Topics Concern  . Not on file  Social History Narrative   Lives with daughter, Kara Dennis, Kara Dennis.    Moved from [place] in 05/2013.   Has 6 children total.   4 in [place]   1 in [place]   1 in [place]       Has many grandchildren. 17 total.      Right-handed   Caffeine: 1 cup every other day    Outpatient Medications Prior to Visit  Medication Sig Dispense Refill  . donepezil (ARICEPT) 10 MG tablet Take 1 tablet (10 mg total) by mouth at bedtime. 90 tablet 1  . memantine (NAMENDA) 10 MG tablet Take 1 tablet (10 mg total) by mouth 2 (two) times daily. 180 tablet 1   No facility-administered medications prior to visit.     No Known Allergies  ROS Review of Systems    Objective:    Physical Exam  Constitutional: She appears well-developed and well-nourished.  Elderly female in NAD accompanied by her daughter  Neck: Normal range of motion. Neck supple. No JVD present.  Cardiovascular: Normal rate.  Pulmonary/Chest: Effort normal and breath sounds normal.  Abdominal: Soft. There is no abdominal tenderness. There is no rebound and no guarding.  Musculoskeletal:        General: No tenderness or edema.     Comments: Mild kyphosis of upper back; no CVA tenderness  Lymphadenopathy:    She has no cervical adenopathy.  Neurological: She is alert.  Skin: Skin is warm and dry.  Slightly dry skin on the lower legs and feet  Nursing note and vitals reviewed.   BP (!) 151/90   Pulse 96   Temp 98 F (36.7 C) (Oral)   Ht 5\' 1"  (1.549 m)   Wt 144 lb (65.3 kg)   SpO2 96%   BMI 27.21 kg/m  Wt Readings from Last 3 Encounters:  11/19/18 144 lb (65.3 kg)  09/16/18 147 lb (66.7 kg)  01/20/18 144 lb 3.2 oz (65.4 kg)     Health Maintenance Due  Topic Date Due  . PNA vac Low Risk Adult (2 of 2 - PPSV23) 07/01/2017  . INFLUENZA VACCINE  10/10/2018   Daughter gave consent for patient to receive influenza immunization at today's visit.  Patient with dementia but was also agreeable to having influenza immunization.  Lab Results  Component Value Date   TSH 0.964 11/14/2016   Lab Results  Component Value Date   WBC 5.8 12/14/2017   HGB 14.9 12/14/2017   HCT 44.0 12/14/2017   MCV 91.5 12/14/2017   PLT 226 12/14/2017    Lab Results  Component Value Date   NA 141 01/20/2018   K 4.0 01/20/2018   CO2 20 01/20/2018   GLUCOSE 91 01/20/2018   BUN 22 01/20/2018   CREATININE 1.28 (H) 01/20/2018   BILITOT 0.4 02/13/2017   ALKPHOS 104 02/13/2017   AST 19 02/13/2017   ALT  10 02/13/2017   PROT 7.2 02/13/2017   ALBUMIN 4.2 02/13/2017   CALCIUM 9.7 01/20/2018   ANIONGAP 9 12/14/2017   Lab Results  Component Value Date   CHOL 128 02/13/2017   Lab Results  Component Value Date   HDL 49 02/13/2017   Lab Results  Component Value Date   LDLCALC 55 02/13/2017   Lab Results  Component Value Date   TRIG 120 02/13/2017   Lab Results  Component Value Date   CHOLHDL 2.6 02/13/2017   Lab Results  Component Value Date   HGBA1C 5.5 03/31/2017      Assessment & Plan:  1. Essential hypertension; 7. history of hyperlipidemia On review of chart, patient with elevated blood pressure at her recent appointment with neurology in follow-up of late onset of Alzheimer's at which time blood pressure was 149/85 and patient's blood pressure elevated today at 151/90.  I would like for patient to restart amlodipine 2.5 mg at bedtime.  Patient is to follow-up with clinical pharmacist in 2 to 3 weeks to have blood pressure rechecked.  If patient is still having fatigue with blood pressure medicine despite taking medication at bedtime, medication can be changed per clinical pharmacist recommendations.  Would like for patient to have slightly lower blood pressure to help reduce stroke risk but do not wish to have blood pressure readings that might potentially cause patient to have dizziness or falls from low blood pressure.  Daughter reports that patient also with history of hyperlipidemia.  Patient will have lipid panel.  But with patient's age and Alzheimer's dementia, medication will likely not be prescribed for mild hyperlipidemia as use of statin medication might outweigh benefit.  Patient was unable to give sample for  urinalysis at today's visit and will contact clinical pharmacist to try to obtain sample for UA at patient's upcoming follow-up appointment. - Lipid panel - amLODipine (NORVASC) 2.5 MG tablet; Take 1 tablet (2.5 mg total) by mouth daily. To lower blood pressure  Dispense: 30 tablet; Refill: 5 - Amb Referral to Clinical Pharmacist  2. Osteoporosis without current pathological fracture, unspecified osteoporosis type Patient has history of osteoporosis but no issues with fractures.  She does have kyphosis of the upper back.  Will check vitamin D level to see if additional vitamin D supplementation required above suggested daily 1000 IUs of OTC vitamin D that daughter was encouraged to have patient start taking on a daily basis.  Patient should also make sure to have dairy rich foods.  At this point calcium supplementation could lead to issues with constipation and potential increased risk of renal stones therefore would limit extra calcium to 1 OTC Tums/calcium carbonate type antacid twice daily or calcium rich diet. - VITAMIN D 25 Hydroxy (Vit-D Deficiency, Fractures)  3. Dementia without behavioral disturbance, unspecified dementia type [facility]) Patient with late onset Alzheimer's which is followed by neurology for which she is currently on Aricept and Namenda.  Daughter reports that patient is not having any behavioral issues and no wandering and tolerates medications without difficulty.  She will have BMP in follow-up of medication use. - Basic Metabolic Panel  4. Need for immunization against influenza Patient will have influenza immunization at today's visit and patient/daughter given educational information regarding immunization. - Flu Vaccine QUAD 6+ mos PF IM (Fluarix Quad PF)  5. History of prediabetes Patient has had prediabetes in the past with mild increase in hemoglobin A1c and will have repeat hemoglobin A1c at today's visit as patient has also had  some recent urinary frequency.  Patient  will also have BMP done to check blood sugar as she is fasting to check glucose level. - Basic Metabolic Panel - Hemoglobin A1c  6. Encounter for long-term (current) use of medications Patient will have BMP at today's visit in follow-up of long-term use of medications for treatment of dementia as well as due to her hypertension - Basic Metabolic Panel  8. Language barrier Patient does not speak [place] and daughter with limited English speaking ability as well therefore [employer] video interpretation system used to help with communication regarding patient's health issues at today's visit   Meds ordered this encounter  Medications  . amLODipine (NORVASC) 2.5 MG tablet    Sig: Take 1 tablet (2.5 mg total) by mouth daily. To lower blood pressure    Dispense:  30 tablet    Refill:  5    Next refill an be 90 days and ask daughter if she would like the RX mailed    Follow-up: Return in about 6 months (around 05/19/2019) for HTN- 3 weeks with Lurena Joiner; 6 months and as needed.    Antony Blackbird, MD

## 2018-11-20 LAB — BASIC METABOLIC PANEL WITH GFR
BUN/Creatinine Ratio: 19 (ref 12–28)
BUN: 20 mg/dL (ref 8–27)
CO2: 20 mmol/L (ref 20–29)
Calcium: 9.6 mg/dL (ref 8.7–10.3)
Chloride: 103 mmol/L (ref 96–106)
Creatinine, Ser: 1.05 mg/dL — ABNORMAL HIGH (ref 0.57–1.00)
GFR calc Af Amer: 59 mL/min/1.73 — ABNORMAL LOW
GFR calc non Af Amer: 51 mL/min/1.73 — ABNORMAL LOW
Glucose: 108 mg/dL — ABNORMAL HIGH (ref 65–99)
Potassium: 4.6 mmol/L (ref 3.5–5.2)
Sodium: 141 mmol/L (ref 134–144)

## 2018-11-20 LAB — LIPID PANEL
Chol/HDL Ratio: 4.5 ratio — ABNORMAL HIGH (ref 0.0–4.4)
Cholesterol, Total: 201 mg/dL — ABNORMAL HIGH (ref 100–199)
HDL: 45 mg/dL
LDL Chol Calc (NIH): 120 mg/dL — ABNORMAL HIGH (ref 0–99)
Triglycerides: 204 mg/dL — ABNORMAL HIGH (ref 0–149)
VLDL Cholesterol Cal: 36 mg/dL (ref 5–40)

## 2018-11-20 LAB — HEMOGLOBIN A1C
Est. average glucose Bld gHb Est-mCnc: 123 mg/dL
Hgb A1c MFr Bld: 5.9 % — ABNORMAL HIGH (ref 4.8–5.6)

## 2018-11-20 LAB — VITAMIN D 25 HYDROXY (VIT D DEFICIENCY, FRACTURES): Vit D, 25-Hydroxy: 16.4 ng/mL — ABNORMAL LOW (ref 30.0–100.0)

## 2018-11-24 ENCOUNTER — Other Ambulatory Visit: Payer: Self-pay | Admitting: Family Medicine

## 2018-11-24 DIAGNOSIS — E559 Vitamin D deficiency, unspecified: Secondary | ICD-10-CM

## 2018-11-24 MED ORDER — VITAMIN D (ERGOCALCIFEROL) 1.25 MG (50000 UNIT) PO CAPS: 50000.0000 [IU] | ORAL_CAPSULE | ORAL | 4 refills | Status: AC

## 2018-11-24 NOTE — Progress Notes (Signed)
Patient ID: Kara Dennis, female   DOB: November 26, 1940, 78 y.o.   MRN: [id number]   Patient with vitamin D deficiency on recent blood work and also with history of osteoporosis.  Prescription will be sent in for patient to take vitamin D therapy.

## 2018-12-07 MED FILL — VIT D2 1.25 MG (50,000 UNIT: 1.25 MG | 35 days supply | Qty: 5 | Fill #0

## 2018-12-10 ENCOUNTER — Ambulatory Visit: Payer: Medicaid Other | Admitting: Pharmacist

## 2019-03-31 ENCOUNTER — Other Ambulatory Visit: Payer: Self-pay

## 2019-03-31 ENCOUNTER — Encounter: Payer: Self-pay | Admitting: Adult Health

## 2019-03-31 ENCOUNTER — Ambulatory Visit: Payer: Medicaid Other | Admitting: Adult Health

## 2019-03-31 DIAGNOSIS — F0390 Unspecified dementia without behavioral disturbance: Secondary | ICD-10-CM

## 2019-03-31 MED ORDER — DONEPEZIL HCL 10 MG PO TABS: 10.0000 mg | ORAL_TABLET | Freq: Every day | ORAL | 3 refills | Status: DC

## 2019-03-31 MED ORDER — MEMANTINE HCL 10 MG PO TABS: 10.0000 mg | ORAL_TABLET | Freq: Two times a day (BID) | ORAL | 3 refills | Status: DC

## 2019-03-31 MED FILL — MEMANTINE HCL 10 MG TABS: 10 | 30 days supply | Qty: 60 | Fill #0

## 2019-03-31 MED FILL — DONEPEZIL HCL 10 MG TABLET: 10 | 90 days supply | Qty: 90 | Fill #0

## 2019-03-31 NOTE — Progress Notes (Addendum)
PATIENT: Kara Dennis DOB: [birth year]  REASON FOR VISIT: follow up HISTORY FROM: patient  HISTORY OF PRESENT ILLNESS: Today 03/31/19:  Kara Dennis is a 79 year old female with a history of Alzheimer's dementia.  She returns today for follow-up.  She is here with her daughter and an interpreter.  The daughter feels that her memory has gotten worse.  She lives with her.  She requires assistance with all ADLs.  She is able to feed herself.  Reports that she sleeps a lot during the day.  Reports that she can get agitated at times but typically this is manageable.  Patient is currently on Aricept and Namenda and tolerating it well.  She returns today for an evaluation.  HISTORY 09/16/18:  Kara Dennis is a 79 year old female with a history of Alzheimer's dementia. She returns today for follow-up.  She is here today with an interpreter.  The patient reports that her memory has remained the same.  However her daughter reports that she requires assistance with all ADLs.  She lives with family.  They help her with her medication and finances.  The patient's daughter needs a letter stating that the patient cannot make decisions on her own if we agree with that.  She remains on Aricept and Namenda.  She tolerates both medications well.  She returns today for evaluation.   REVIEW OF SYSTEMS: Out of a complete 14 system review of symptoms, the patient complains only of the following symptoms, and all other reviewed systems are negative.  See HPI  ALLERGIES: No Known Allergies  HOME MEDICATIONS: Outpatient Medications Prior to Visit  Medication Sig Dispense Refill  . amLODipine (NORVASC) 2.5 MG tablet Take 1 tablet (2.5 mg total) by mouth daily. To lower blood pressure 30 tablet 5  . Vitamin D, Ergocalciferol, (DRISDOL) 1.25 MG (50000 UT) CAPS capsule Take 1 capsule (50,000 Units total) by mouth every 7 (seven) days. 5 capsule 4  . donepezil (ARICEPT) 10 MG tablet Take 1 tablet (10 mg total) by mouth  at bedtime. (Patient not taking: Reported on 03/31/2019) 90 tablet 1  . memantine (NAMENDA) 10 MG tablet Take 1 tablet (10 mg total) by mouth 2 (two) times daily. (Patient not taking: Reported on 03/31/2019) 180 tablet 1   No facility-administered medications prior to visit.    PAST MEDICAL HISTORY: Past Medical History:  Diagnosis Date  . Bradycardia 2014    identified before hysterectomy, treated for short time with medication before during and after hysterectomy   . Cervical cancer ([place]) 2014    s/p hysterectomy, no chemo or radiation   . Dementia ([place])   . HTN (hypertension) 2013   took some medicine in [place]     PAST SURGICAL HISTORY: Past Surgical History:  Procedure Laterality Date  . ABDOMINAL HYSTERECTOMY  05/2012     FAMILY HISTORY: Family History  Problem Relation Age of Onset  . Diabetes Brother   . Heart disease Neg Hx   . Cancer Neg Hx   . Dementia Neg Hx   . Breast cancer Neg Hx     SOCIAL HISTORY: Social History   Socioeconomic History  . Marital status: Widowed    Spouse name: Not on file  . Number of children: 6   . Years of education: 3  . Highest education level: Not on file  Occupational History  . Occupation: Homemaker   Tobacco Use  . Smoking status: Never Smoker  . Smokeless tobacco: Never Used  Substance and  Sexual Activity  . Alcohol use: No    Alcohol/week: 0.0 standard drinks  . Drug use: No  . Sexual activity: Never    Birth control/protection: None  Other Topics Concern  . Not on file  Social History Narrative   Lives with daughter, [place], [place].    Moved from [place] in 05/2013.   Has 6 children total.   4 in [place]   1 in [place]   1 in [place]       Has many grandchildren. 17 total.    Right-handed   Caffeine: 1 cup every other day   Social Determinants of Health   Financial Resource Strain:   . Difficulty of Paying Living Expenses: Not on file  Food Insecurity:   . Worried About Charity fundraiser in the Last  Year: Not on file  . Ran Out of Food in the Last Year: Not on file  Transportation Needs:   . Lack of Transportation (Medical): Not on file  . Lack of Transportation (Non-Medical): Not on file  Physical Activity:   . Days of Exercise per Week: Not on file  . Minutes of Exercise per Session: Not on file  Stress:   . Feeling of Stress : Not on file  Social Connections:   . Frequency of Communication with Friends and Family: Not on file  . Frequency of Social Gatherings with Friends and Family: Not on file  . Attends Religious Services: Not on file  . Active Member of Clubs or Organizations: Not on file  . Attends Archivist Meetings: Not on file  . Marital Status: Not on file  Intimate Partner Violence:   . Fear of Current or Ex-Partner: Not on file  . Emotionally Abused: Not on file  . Physically Abused: Not on file  . Sexually Abused: Not on file      PHYSICAL EXAM  Vitals:   03/31/19 1025  BP: (!) 146/68  Pulse: 95  Temp: (!) 97 F (36.1 C)  Weight: 142 lb 12.8 oz (64.8 kg)  Height: 5\' 1"  (1.549 m)   Body mass index is 26.98 kg/m.  Generalized: Well developed, in no acute distress   Neurological examination  Mentation: Alert oriented to time, place, history taking. Follows all commands speech and language fluent Cranial nerve II-XII: Pupils were equal round reactive to light. Facial sensation and strength were normal. Uvula tongue midline. Head turning and shoulder shrug  were normal and symmetric. Motor: The motor testing reveals 5 over 5 strength of all 4 extremities. Good symmetric motor tone is noted throughout.  Sensory: Sensory testing is intact to soft touch on all 4 extremities. No evidence of extinction is noted.  Coordination: Cerebellar testing reveals some difficulty understanding directions with finger-nose-finger and heel-to-shin bilaterally.  Gait and station: Gait is normal.   DIAGNOSTIC DATA (LABS, IMAGING, TESTING) - I reviewed patient  records, labs, notes, testing and imaging myself where available.  Lab Results  Component Value Date   WBC 5.8 12/14/2017   HGB 14.9 12/14/2017   HCT 44.0 12/14/2017   MCV 91.5 12/14/2017   PLT 226 12/14/2017      Component Value Date/Time   NA 141 11/19/2018 0952   K 4.6 11/19/2018 0952   CL 103 11/19/2018 0952   CO2 20 11/19/2018 0952   GLUCOSE 108 (H) 11/19/2018 0952   GLUCOSE 109 (H) 12/14/2017 1056   BUN 20 11/19/2018 0952   CREATININE 1.05 (H) 11/19/2018 0952   CREATININE 0.85 01/28/2014 1158  CALCIUM 9.6 11/19/2018 0952   PROT 7.2 02/13/2017 0905   ALBUMIN 4.2 02/13/2017 0905   AST 19 02/13/2017 0905   ALT 10 02/13/2017 0905   ALKPHOS 104 02/13/2017 0905   BILITOT 0.4 02/13/2017 0905   GFRNONAA 51 (L) 11/19/2018 0952   GFRNONAA 68 01/28/2014 1158   GFRAA 59 (L) 11/19/2018 0952   GFRAA 79 01/28/2014 1158   Lab Results  Component Value Date   CHOL 201 (H) 11/19/2018   HDL 45 11/19/2018   LDLCALC 120 (H) 11/19/2018   TRIG 204 (H) 11/19/2018   CHOLHDL 4.5 (H) 11/19/2018   Lab Results  Component Value Date   HGBA1C 5.9 (H) 11/19/2018   Lab Results  Component Value Date   [id number] 05/30/2016   Lab Results  Component Value Date   TSH 0.964 11/14/2016      ASSESSMENT AND PLAN 79 y.o. year old female  has a past medical history of Bradycardia (2014 ), Cervical cancer ([place]) (2014 ), Dementia ([place]), and HTN (hypertension) (2013). here with:  1.  Alzheimer's dementia  -Continue Aricept and Namenda -Unable to complete MMSE today -Continue to monitor symptoms -Follow-up in 6 months or sooner if needed   I spent 15 minutes with the patient. 50% of this time was spent discussing plan of care   Ward Givens, MSN, NP-C 03/31/2019, 10:54 AM [facility] [address] [place], [place] [address] [phone]  Made any corrections needed, and agree with history, physical, neuro exam,assessment and plan as stated.      Sarina Ill, MD [facility]

## 2019-03-31 NOTE — Patient Instructions (Signed)
Your Plan:  Continue Aricept and Namenda If your symptoms worsen or you develop new symptoms please let us know.   Thank you for coming to see us at [facility]. I hope we have been able to provide you high quality care today.  You may receive a patient satisfaction survey over the next few weeks. We would appreciate your feedback and comments so that we may continue to improve ourselves and the health of our patients.  

## 2019-04-20 ENCOUNTER — Telehealth: Payer: Self-pay | Admitting: Family Medicine

## 2019-04-20 NOTE — Telephone Encounter (Signed)
Please advice ! Pt would like to know if OK to take Covid vaccine. Thank you

## 2019-04-20 NOTE — Telephone Encounter (Signed)
Pt would like a call back to make sure its ok for her to take the COVID vaccine on Friday 02/12/202. Please follow up

## 2019-04-20 NOTE — Telephone Encounter (Signed)
As instructed my MD Called pt at [phone], Rodman Comp (daughter) answered. Verified DOB and name. Attached message given as instructed by MD. Verbalized understanding

## 2019-04-20 NOTE — Telephone Encounter (Signed)
Unless patient is actively sick or has a respiratory illness then she should be able to take the COVID-19 vaccine.  When she comes to have the vaccine, they will ask screening questions and take her temperature to make sure that she does not have a fever.

## 2019-06-03 ENCOUNTER — Telehealth: Payer: Self-pay | Admitting: Adult Health

## 2019-06-03 MED FILL — MEMANTINE HCL 10 MG TABS: 10 | 90 days supply | Qty: 180 | Fill #1

## 2019-06-03 NOTE — Telephone Encounter (Signed)
I called [facility] spoke to pharmacy and they have refills for pt.   They will call and relay to pt/ family.

## 2019-06-03 NOTE — Telephone Encounter (Signed)
1) Medication(s) Requested (by name): memantine (NAMENDA) 10 MG tablet  donepezil (ARICEPT) 10 MG tablet   2) Pharmacy of Choice:  [facility], [address]  [place], [place] [address]   Requested cb# once processed

## 2019-09-28 ENCOUNTER — Encounter: Payer: Self-pay | Admitting: Adult Health

## 2019-09-28 ENCOUNTER — Ambulatory Visit: Payer: Medicaid Other | Admitting: Adult Health

## 2019-10-14 IMAGING — CT CT HEAD W/O CM
3 series · 16 of 47 positions shown, 19 images · non-contrast
Comparison: None

CLINICAL DATA: Multiple short syncopal episodes this morning,
history hypertension, dementia

EXAM:
CT HEAD WITHOUT CONTRAST
TECHNIQUE: Contiguous axial images were obtained from the base of the skull
through the vertex without intravenous contrast. Sagittal and
coronal MPR images reconstructed from axial data set.

[Series 3: head 5.0 h30s · axial · 0.40mm/px · z∈[-111,+34]mm · 10 of 35 slices shown, 13 images]
[im 3/35  brain]
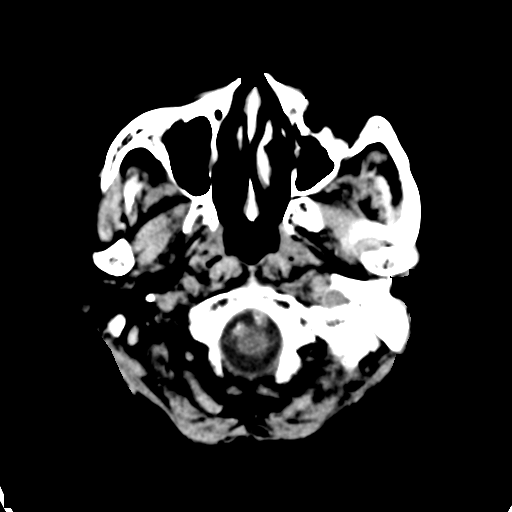
[im 3/35  bone]
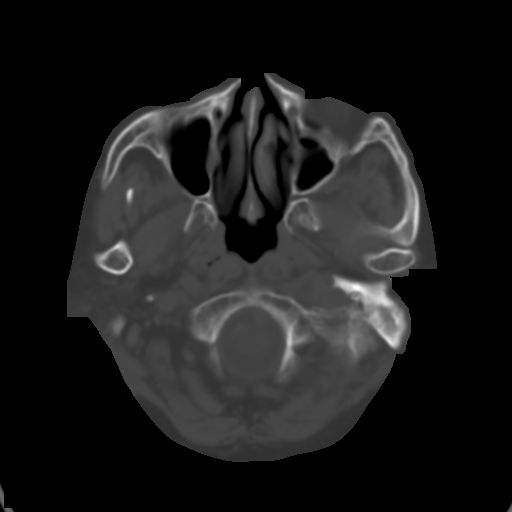
[im 6/35  brain]
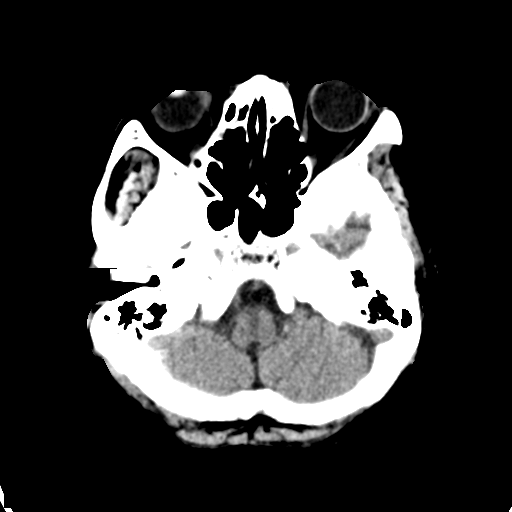
[im 10/35  brain]
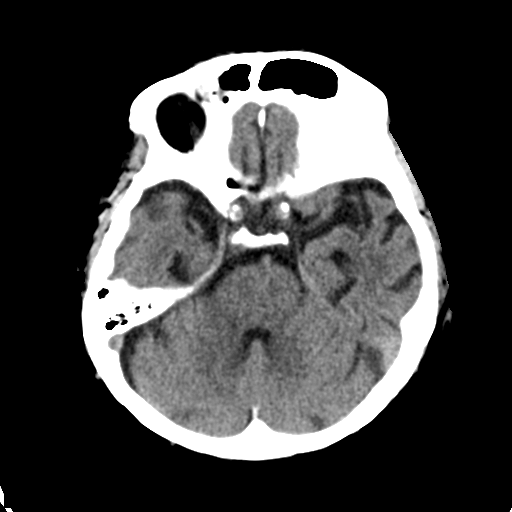
[im 12/35  brain]
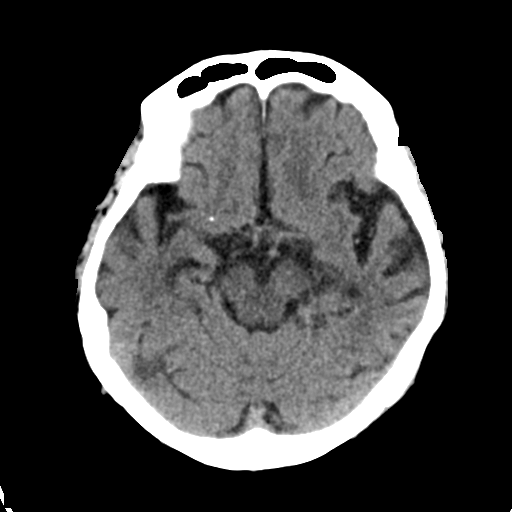
[im 16/35  brain]
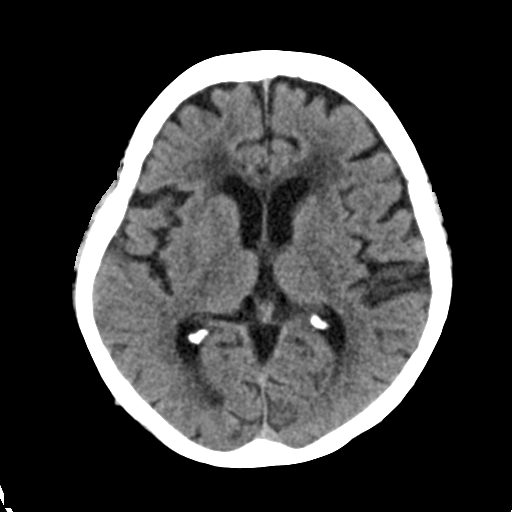
[im 16/35  bone]
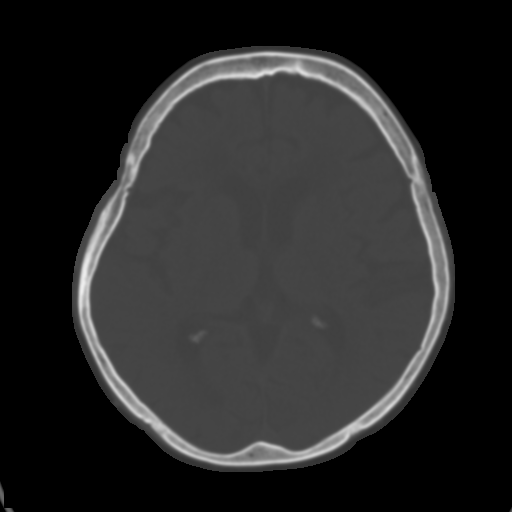
[im 19/35  brain]
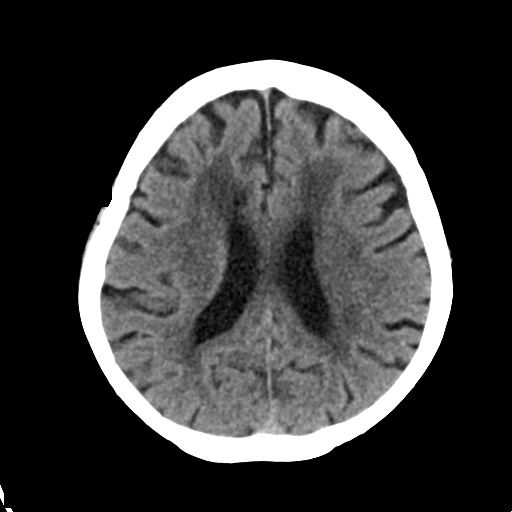
[im 23/35  brain]
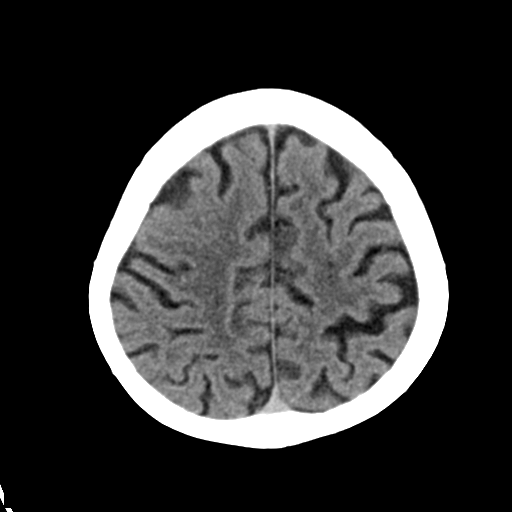
[im 26/35  brain]
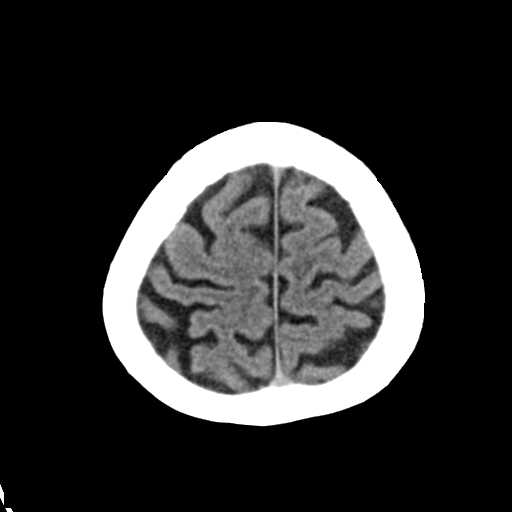
[im 29/35  brain]
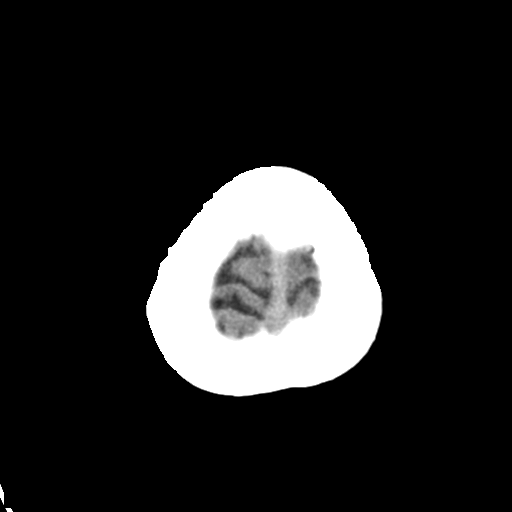
[im 29/35  bone]
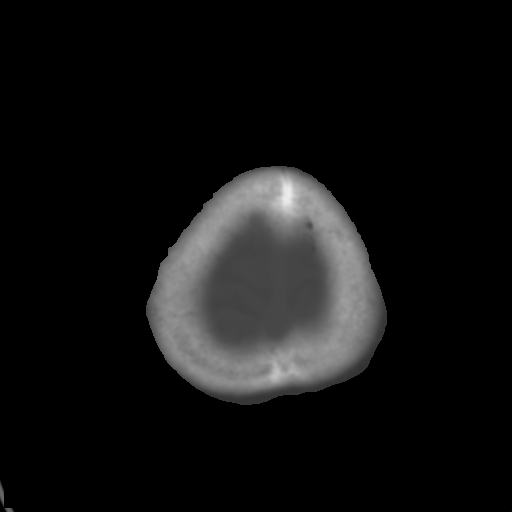
[im 32/35  brain]
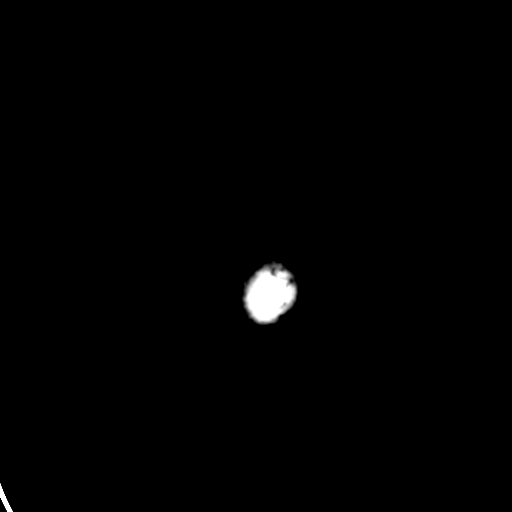

[Series 5: head 3.0 mpr cor · coronal · 0.35mm/px · 3 of 66 slices shown]
[im 22/66  brain]
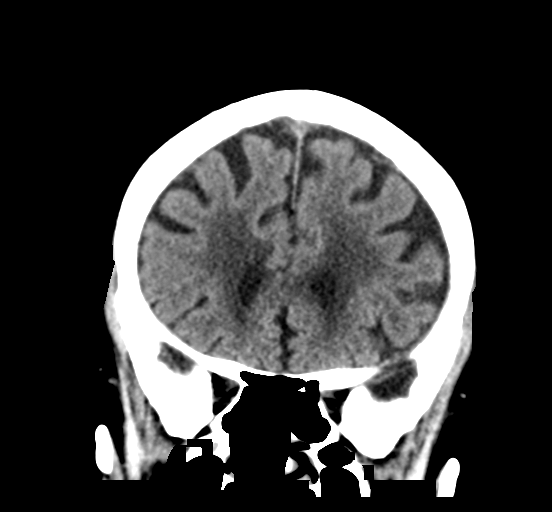
[im 29/66  brain]
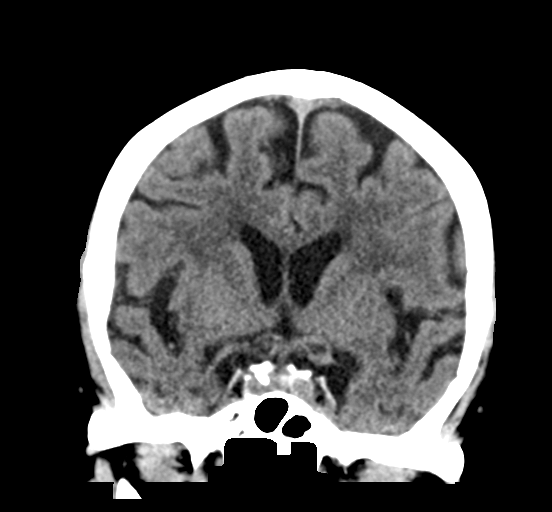
[im 37/66  brain]
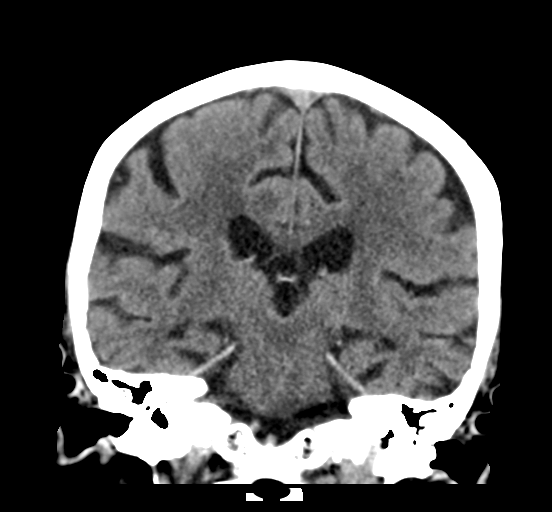

[Series 6: head 3.0 mpr sag · sagittal · 0.37mm/px · 3 of 67 slices shown]
[im 23/67  brain]
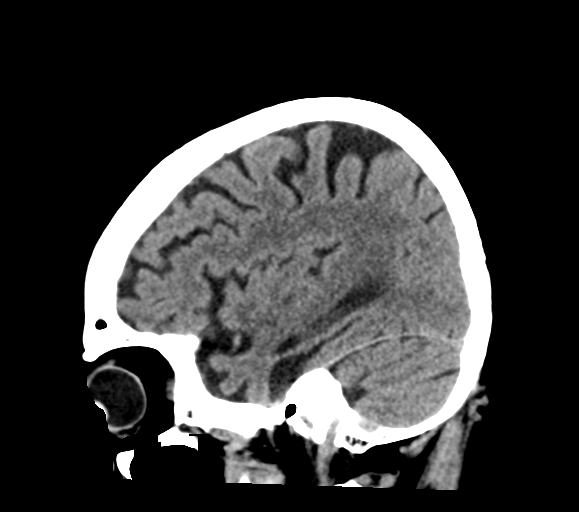
[im 34/67  brain]
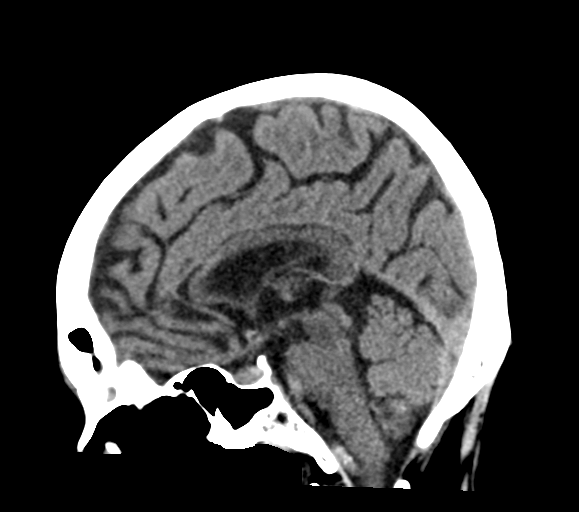
[im 45/67  brain]
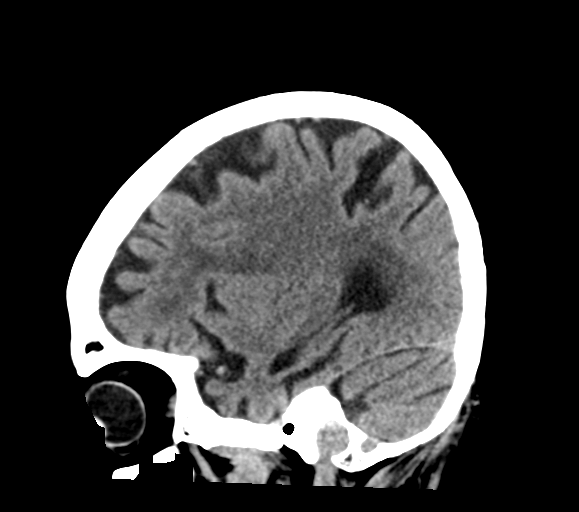

[16 of 47 positions shown; findings below may reference images not displayed]

FINDINGS: Brain: Normal ventricular morphology. No midline shift or mass
effect. Normal appearance of brain parenchyma. No intracranial
hemorrhage, mass lesion, evidence of acute infarction, or
extra-axial fluid collection.

Vascular: Mild atherosclerotic calcification of internal carotid
arteries bilaterally at skull base

Skull: Demineralized but intact

Sinuses/Orbits: Partial opacification and air-fluid levels of the
frontal sinus. Minimally mucosal thickening in a few ethmoid air
cells bilaterally. Remaining visualized paranasal sinuses and
mastoid air cells clear

Other: N/A
IMPRESSION: Atrophy with small vessel chronic ischemic changes of deep cerebral
white matter.

No acute intracranial abnormalities.

Frontal sinus disease changes.

## 2019-10-14 IMAGING — DX DG CHEST 1V PORT
1 series · 1 of 1 positions shown · non-contrast
Comparison: 06/05/2016

CLINICAL DATA: Syncope, weakness

EXAM:
PORTABLE CHEST 1 VIEW

[chest]
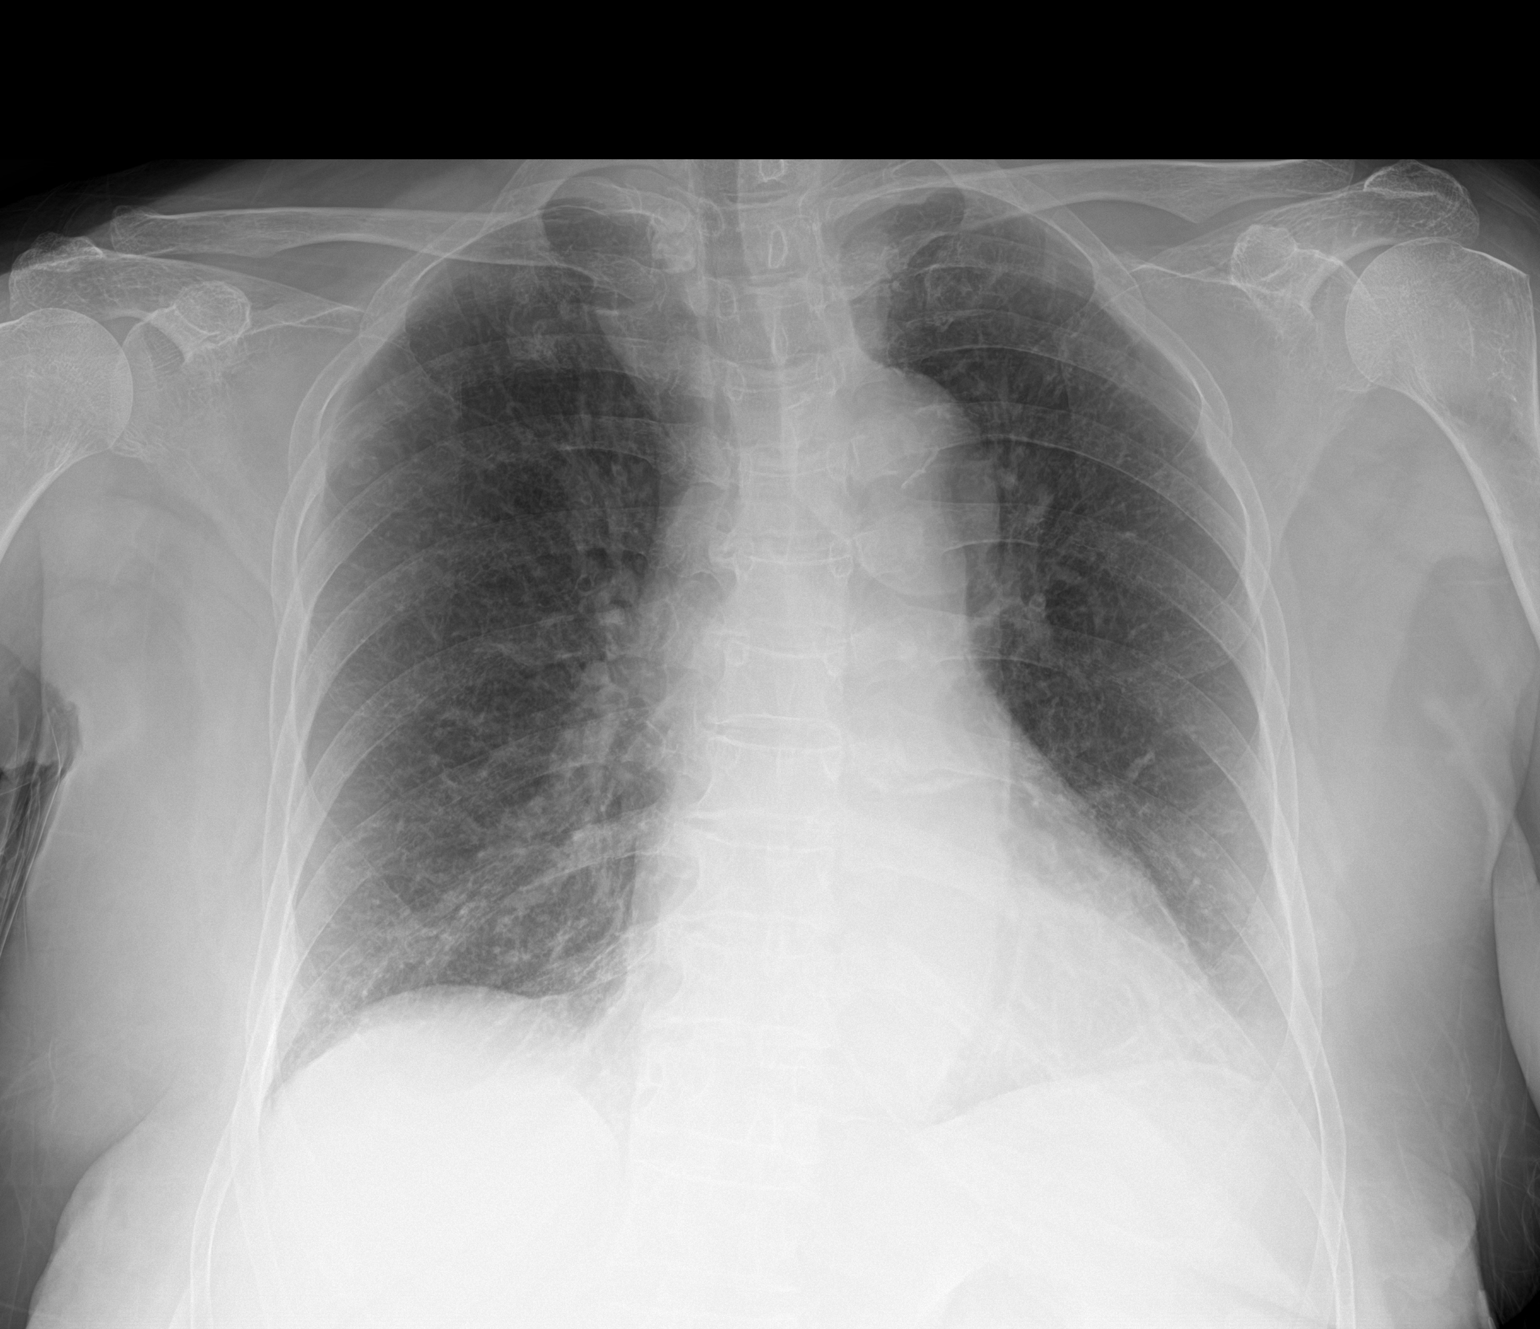

[1 of 1 positions shown; findings below may reference images not displayed]

FINDINGS: Heart and mediastinal contours are within normal limits. No focal
opacities or effusions. No acute bony abnormality.
IMPRESSION: No active disease.

## 2020-06-05 ENCOUNTER — Emergency Department (HOSPITAL_COMMUNITY)
Admission: EM | Admit: 2020-06-05 | Discharge: 2020-06-05 | Disposition: A | Discharge status: Home / Self Care | Payer: Medicaid Other | Attending: Emergency Medicine | Admitting: Emergency Medicine

## 2020-06-05 ENCOUNTER — Other Ambulatory Visit: Payer: Self-pay

## 2020-06-05 DIAGNOSIS — M7989 Other specified soft tissue disorders: Secondary | ICD-10-CM

## 2020-06-05 DIAGNOSIS — Z8541 Personal history of malignant neoplasm of cervix uteri: Secondary | ICD-10-CM | POA: Insufficient documentation

## 2020-06-05 DIAGNOSIS — N3000 Acute cystitis without hematuria: Secondary | ICD-10-CM | POA: Insufficient documentation

## 2020-06-05 DIAGNOSIS — F0390 Unspecified dementia without behavioral disturbance: Secondary | ICD-10-CM | POA: Insufficient documentation

## 2020-06-05 DIAGNOSIS — Z79899 Other long term (current) drug therapy: Secondary | ICD-10-CM | POA: Insufficient documentation

## 2020-06-05 DIAGNOSIS — R2242 Localized swelling, mass and lump, left lower limb: Secondary | ICD-10-CM | POA: Insufficient documentation

## 2020-06-05 DIAGNOSIS — I10 Essential (primary) hypertension: Secondary | ICD-10-CM | POA: Insufficient documentation

## 2020-06-05 DIAGNOSIS — R140 Abdominal distension (gaseous): Secondary | ICD-10-CM | POA: Diagnosis present

## 2020-06-05 LAB — URINALYSIS, ROUTINE W REFLEX MICROSCOPIC
Bilirubin Urine: NEGATIVE
Glucose, UA: 50 mg/dL — AB
Ketones, ur: NEGATIVE mg/dL
Nitrite: NEGATIVE
Protein, ur: NEGATIVE mg/dL
Specific Gravity, Urine: 1.017 (ref 1.005–1.030)
WBC, UA: >50 WBC/hpf — ABNORMAL HIGH (ref 0–5)
pH: 5 (ref 5.0–8.0)

## 2020-06-05 LAB — CBC
HCT: 40.8 % (ref 36.0–46.0)
Hemoglobin: 14 g/dL (ref 12.0–15.0)
MCH: 30.8 pg (ref 26.0–34.0)
MCHC: 34.3 g/dL (ref 30.0–36.0)
MCV: 89.9 fL (ref 80.0–100.0)
Platelets: 216 K/uL (ref 150–400)
RBC: 4.54 MIL/uL (ref 3.87–5.11)
RDW: 12.7 % (ref 11.5–15.5)
WBC: 5.7 K/uL (ref 4.0–10.5)
nRBC: 0 % (ref 0.0–0.2)

## 2020-06-05 LAB — COMPREHENSIVE METABOLIC PANEL
ALT: 25 U/L (ref 0–44)
AST: 33 U/L (ref 15–41)
Albumin: 3.3 g/dL — ABNORMAL LOW (ref 3.5–5.0)
Alkaline Phosphatase: 85 U/L (ref 38–126)
Anion gap: 6 (ref 5–15)
BUN: 21 mg/dL (ref 8–23)
CO2: 25 mmol/L (ref 22–32)
Calcium: 8.8 mg/dL — ABNORMAL LOW (ref 8.9–10.3)
Chloride: 106 mmol/L (ref 98–111)
Creatinine, Ser: 1 mg/dL (ref 0.44–1.00)
GFR, Estimated: 57 mL/min — ABNORMAL LOW (ref >60)
Glucose, Bld: 151 mg/dL — ABNORMAL HIGH (ref 70–99)
Potassium: 3.4 mmol/L — ABNORMAL LOW (ref 3.5–5.1)
Sodium: 137 mmol/L (ref 135–145)
Total Bilirubin: 0.7 mg/dL (ref 0.3–1.2)
Total Protein: 7 g/dL (ref 6.5–8.1)

## 2020-06-05 LAB — LIPASE, BLOOD: Lipase: 71 U/L — ABNORMAL HIGH (ref 11–51)

## 2020-06-05 LAB — BRAIN NATRIURETIC PEPTIDE: B Natriuretic Peptide: 69 pg/mL (ref 0.0–100.0)

## 2020-06-05 MED ORDER — CEPHALEXIN 500 MG PO CAPS: 500.0000 mg | ORAL_CAPSULE | Freq: Two times a day (BID) | ORAL | 0 refills | Status: AC

## 2020-06-05 NOTE — Discharge Instructions (Signed)
-  It is important to follow-up with your primary doctor.  You have seen the [facility] in the past and you should call them for an appointment tomorrow morning.  Please let them know you have not taken your blood pressure medications for a while and likely need a full health visit. -You were diagnosed with a urinary tract infection today.  Please take your antibiotics twice a day for 7 days.  This should help your infection.

## 2020-06-05 NOTE — ED Triage Notes (Signed)
C/o abdominal pain and leg swelling x 1 month.

## 2020-06-05 NOTE — ED Notes (Signed)
Patient and granddaughter given discharge paperwork, prescriptions, and instructions. Verbalized understanding of teaching. No IV access. Wheeled to exit in NAD.

## 2020-06-05 NOTE — ED Provider Notes (Signed)
[facility] EMERGENCY DEPARTMENT Provider Note   CSN: [id number] Arrival date & time: 06/05/20  1135     History Chief Complaint  Patient presents with  . Abdominal Pain  . Leg Swelling    Kara Dennis is a 80 y.o. female.  Patient is a 80 year old female with a history of hypertension not on any medications who presents with concerns for leg swelling and abdominal distention.  Patient speaks [place] and has a history of dementia and therefore most of the history is provided by her granddaughter.  Patient currently lives with the granddaughter's aunt and uncle.  The granddaughter recently moved in with her 3 weeks ago.  As of 3 weeks ago when the granddaughter moved and she noticed her grandmother had some leg swelling and abdominal distention.  She states she thinks that the abdominal distention is causing her some pain.  She is unsure how long the symptoms have been going on since she has not lived with her all of this time.  She states otherwise patient has had no headaches, visual changes, chest pain, shortness of breath, changes in her bowel habits, or vomiting.  Patient has had some darker and more foul-smelling urine.  Patient does not have a primary doctor. She has had no change in PO intake over the last few weeks. Still tolerating food and drink normally.         Past Medical History:  Diagnosis Date  . Bradycardia 2014    identified before hysterectomy, treated for short time with medication before during and after hysterectomy   . Cervical cancer ([place]) 2014    s/p hysterectomy, no chemo or radiation   . Dementia ([place])   . HTN (hypertension) 2013   took some medicine in [place]     Patient Active Problem List   Diagnosis Date Noted  . Syncope due to orthostatic hypotension 12/15/2017  . Prediabetes 06/30/2016  . Neck mass 04/17/2016  . High cholesterol 01/31/2014  . History of cervical cancer 01/28/2014  . Dementia ([place]) 01/28/2014  . HTN  (hypertension)     Past Surgical History:  Procedure Laterality Date  . ABDOMINAL HYSTERECTOMY  05/2012      OB History   No obstetric history on file.     Family History  Problem Relation Age of Onset  . Diabetes Brother   . Heart disease Neg Hx   . Cancer Neg Hx   . Dementia Neg Hx   . Breast cancer Neg Hx     Social History   Tobacco Use  . Smoking status: Never Smoker  . Smokeless tobacco: Never Used  Vaping Use  . Vaping Use: Never used  Substance Use Topics  . Alcohol use: No    Alcohol/week: 0.0 standard drinks  . Drug use: No    Home Medications Prior to Admission medications   Medication Sig Start Date End Date Taking? Authorizing Provider  cephALEXin (KEFLEX) 500 MG capsule Take 1 capsule (500 mg total) by mouth 2 (two) times daily for 7 days. 06/05/20 06/12/20 Yes Vivienne Sangiovanni, [place], MD  amLODipine (NORVASC) 2.5 MG tablet Take 1 tablet (2.5 mg total) by mouth daily. To lower blood pressure 11/19/18   Fulp, Cammie, MD  donepezil (ARICEPT) 10 MG tablet Take 1 tablet (10 mg total) by mouth at bedtime. 03/31/19   Ward Givens, NP  memantine (NAMENDA) 10 MG tablet Take 1 tablet (10 mg total) by mouth 2 (two) times daily. 03/31/19   Ward Givens, NP  Vitamin D, Ergocalciferol, (DRISDOL) 1.25 MG (50000 UT) CAPS capsule Take 1 capsule (50,000 Units total) by mouth every 7 (seven) days. 11/24/18   Antony Blackbird, MD    Allergies    Patient has no known allergies.  Review of Systems   Review of Systems  Constitutional: Negative for chills and fever.  HENT: Negative for ear pain and sore throat.   Eyes: Negative for pain and visual disturbance.  Respiratory: Negative for cough and shortness of breath.   Cardiovascular: Positive for leg swelling. Negative for chest pain.  Gastrointestinal: Positive for abdominal distention and abdominal pain. Negative for blood in stool, constipation, diarrhea, nausea and vomiting.  Genitourinary: Negative for dysuria and hematuria.   Musculoskeletal: Negative for back pain and neck pain.  Skin: Negative for color change and rash.  Neurological: Negative for seizures and syncope.  All other systems reviewed and are negative.   Physical Exam Updated Vital Signs BP (!) 171/90 (BP Location: Right Arm)   Pulse 87   Temp 97.9 F (36.6 C)   Resp 18   SpO2 98%   Physical Exam Vitals and nursing note reviewed.  Constitutional:      General: She is not in acute distress.    Appearance: She is well-developed.  HENT:     Head: Normocephalic and atraumatic.  Eyes:     Conjunctiva/sclera: Conjunctivae normal.  Cardiovascular:     Rate and Rhythm: Normal rate and regular rhythm.  Pulmonary:     Effort: Pulmonary effort is normal. No respiratory distress.     Breath sounds: Normal breath sounds.  Abdominal:     General: Bowel sounds are normal. There is no distension.     Palpations: Abdomen is soft.     Tenderness: There is no abdominal tenderness. There is no right CVA tenderness or left CVA tenderness. Negative signs include Murphy's sign and McBurney's sign.     Hernia: No hernia is present.  Musculoskeletal:     Cervical back: Neck supple.  Skin:    General: Skin is warm and dry.  Neurological:     Mental Status: She is alert.     ED Results / Procedures / Treatments   Labs (all labs ordered are listed, but only abnormal results are displayed) Labs Reviewed  LIPASE, BLOOD - Abnormal; Notable for the following components:      Result Value   Lipase 71 (*)    All other components within normal limits  COMPREHENSIVE METABOLIC PANEL - Abnormal; Notable for the following components:   Potassium 3.4 (*)    Glucose, Bld 151 (*)    Calcium 8.8 (*)    Albumin 3.3 (*)    GFR, Estimated 57 (*)    All other components within normal limits  URINALYSIS, ROUTINE W REFLEX MICROSCOPIC - Abnormal; Notable for the following components:   APPearance HAZY (*)    Glucose, UA 50 (*)    Hgb urine dipstick SMALL (*)     Leukocytes,Ua LARGE (*)    WBC, UA >50 (*)    Bacteria, UA MANY (*)    All other components within normal limits  CBC  BRAIN NATRIURETIC PEPTIDE    EKG EKG Interpretation  Date/Time:  Monday June 05 2020 12:19:24 EDT Ventricular Rate:  93 PR Interval:  174 QRS Duration: 66 QT Interval:  400 QTC Calculation: 497 R Axis:   37 Text Interpretation: Normal sinus rhythm Nonspecific ST and T wave abnormality Prolonged QT Abnormal ECG Confirmed by Elnora Morrison [phone]) on  06/05/2020 8:30:04 PM   Radiology No results found.  Procedures Procedures   Medications Ordered in ED Medications - No data to display  ED Course  I have reviewed the triage vital signs and the nursing notes.  Pertinent labs & imaging results that were available during my care of the patient were reviewed by me and considered in my medical decision making (see chart for details).    MDM Rules/Calculators/A&P                          80 year old female with a history of hypertension who presents with concerns for leg swelling and abdominal distention.  Patient symptoms have been going on for at least 3 weeks, maybe longer. While she has experience abdominal distension and potential discomfort, patient has had no changed in her eating habits or bowel habits.  She is not tender on examination today. There is very minimal, if any, distension. Her abdomen is soft. Bowel sounds are normal. No RUQ tenderness. No lower abdominal tenderness. No rebound, no guarding. Examination does not suggest acute obstruction, infection, or infarction. History does not suggest the aforementioned either with patient having normal bowel movements and normal eating habits without any notable changes. She appears in no acute distress, well-hydrated, and well-nourished.   Lower extremities have no evidence of pitting edema. They might be mildly more swollen from baseline, but hard to tell given I have never seen patient in the past.  They are equal. There are no significant skin changes over lower extremities. There is no significant tenderness associated with palpation of the bilateral calf muscles. Patient has good ROM of the legs. Strong +2 equal DP and PT pulses. Good capillary refill. Intact sensation over feet and lower extremities.  This condition appears more chronic than acute in nature.  Doubt DVT based off of my physical exam.  Doubt heart failure given normal BNP and no signs or symptoms of heart failure.  Possibly gravitational or secondary to venous stasis, will encourage compression stockings and elevation as needed.  I reviewed patient's lab work.  Slightly elevated lipase, however no epigastric tenderness so doubt pancreatitis.  Creatinine 1.0 which is consistent with creatinine in the chart up to a year ago.  CBC without any acute findings.  BMP normal.  UA does show evidence for urinary tract infection.  Will treat patient with Keflex.  Overall, presentation most consistent with urinary tract infection at this time.  I do believe that it is important that patient follows with the primary doctor given her age and comorbidities.  Her blood pressure is elevated today.  I encouraged granddaughter to call the [facility] to schedule follow-up appointment.  Per my chart review, it has been over a year since patient has seen a primary doctor.  Keflex prescribed twice daily for 7 days.  Patient to follow-up with PCP at earliest convenience.  Stable for discharge home.  Final Clinical Impression(s) / ED Diagnoses Final diagnoses:  Leg swelling  Acute cystitis without hematuria    Rx / DC Orders ED Discharge Orders         Ordered    cephALEXin (KEFLEX) 500 MG capsule  2 times daily        06/05/20 2046           Clemence Stillings, [place], MD 06/05/20 2136    Elnora Morrison, MD 06/05/20 2311

## 2020-08-31 ENCOUNTER — Other Ambulatory Visit: Payer: Self-pay

## 2020-08-31 ENCOUNTER — Ambulatory Visit: Payer: Medicaid Other | Admitting: Adult Health

## 2020-08-31 ENCOUNTER — Other Ambulatory Visit: Payer: Self-pay | Admitting: Physician Assistant

## 2020-08-31 DIAGNOSIS — F0390 Unspecified dementia without behavioral disturbance: Secondary | ICD-10-CM | POA: Diagnosis not present

## 2020-08-31 DIAGNOSIS — M810 Age-related osteoporosis without current pathological fracture: Secondary | ICD-10-CM

## 2020-08-31 NOTE — Patient Instructions (Signed)
Your Plan:  Continue to monitor symptoms If your symptoms worsen or you develop new symptoms please let us know.   Thank you for coming to see us at [facility]. I hope we have been able to provide you high quality care today.  You may receive a patient satisfaction survey over the next few weeks. We would appreciate your feedback and comments so that we may continue to improve ourselves and the health of our patients.   

## 2020-08-31 NOTE — Progress Notes (Addendum)
PATIENT: Kara Dennis DOB: [birth year]  REASON FOR VISIT: follow up HISTORY FROM: patient Primary neurologist: Dr. Jaynee Eagles  HISTORY OF PRESENT ILLNESS: Today 08/31/20:  Kara Dennis is an 80 year old female with a history of memory disturbance.  She returns today for follow-up.  She is here today with her daughter and an interpreter.  The daughter reports that she lives with her.  She requires assistance with all ADLs.  She does not operate a motor vehicle.  Reports that insurance would not cover Aricept nor Namenda so she has been off this medicine for quite some time.  Patient returns today for follow-up.   03/31/19: Kara Dennis is a 80 year old female with a history of Alzheimer's dementia.  She returns today for follow-up.  She is here with her daughter and an interpreter.  The daughter feels that her memory has gotten worse.  She lives with her.  She requires assistance with all ADLs.  She is able to feed herself.  Reports that she sleeps a lot during the day.  Reports that she can get agitated at times but typically this is manageable.  Patient is currently on Aricept and Namenda and tolerating it well.  She returns today for an evaluation.  HISTORY 09/16/18:   Kara Dennis is a 80 year old female with a history of Alzheimer's dementia. She returns today for follow-up.  She is here today with an interpreter.  The patient reports that her memory has remained the same.  However her daughter reports that she requires assistance with all ADLs.  She lives with family.  They help her with her medication and finances.  The patient's daughter needs a letter stating that the patient cannot make decisions on her own if we agree with that.  She remains on Aricept and Namenda.  She tolerates both medications well.  She returns today for evaluation.    REVIEW OF SYSTEMS: Out of a complete 14 system review of symptoms, the patient complains only of the following symptoms, and all other reviewed systems are  negative.  See HPI  ALLERGIES: No Known Allergies  HOME MEDICATIONS: Outpatient Medications Prior to Visit  Medication Sig Dispense Refill   amLODipine (NORVASC) 2.5 MG tablet Take 1 tablet (2.5 mg total) by mouth daily. To lower blood pressure 30 tablet 5   memantine (NAMENDA) 10 MG tablet Take 1 tablet (10 mg total) by mouth 2 (two) times daily. 180 tablet 3   Vitamin D, Ergocalciferol, (DRISDOL) 1.25 MG (50000 UT) CAPS capsule Take 1 capsule (50,000 Units total) by mouth every 7 (seven) days. 5 capsule 4   donepezil (ARICEPT) 10 MG tablet Take 1 tablet (10 mg total) by mouth at bedtime. 90 tablet 3   No facility-administered medications prior to visit.    PAST MEDICAL HISTORY: Past Medical History:  Diagnosis Date   Bradycardia 2014    identified before hysterectomy, treated for short time with medication before during and after hysterectomy    Cervical cancer ([place]) 2014    s/p hysterectomy, no chemo or radiation    Dementia ([place])    HTN (hypertension) 2013   took some medicine in [place]     PAST SURGICAL HISTORY: Past Surgical History:  Procedure Laterality Date   ABDOMINAL HYSTERECTOMY  05/2012     FAMILY HISTORY: Family History  Problem Relation Age of Onset   Diabetes Brother    Heart disease Neg Hx    Cancer Neg Hx    Dementia Neg Hx  Breast cancer Neg Hx     SOCIAL HISTORY: Social History   Socioeconomic History   Marital status: Widowed    Spouse name: Not on file   Number of children: 6    Years of education: 3   Highest education level: Not on file  Occupational History   Occupation: Homemaker   Tobacco Use   Smoking status: Never   Smokeless tobacco: Never  Vaping Use   Vaping Use: Never used  Substance and Sexual Activity   Alcohol use: No    Alcohol/week: 0.0 standard drinks   Drug use: No   Sexual activity: Never    Birth control/protection: None  Other Topics Concern   Not on file  Social History Narrative   Lives with  daughter, [place], [place].    Moved from [place] in 05/2013.   Has 6 children total.   4 in [place]   1 in [place]   1 in [place]       Has many grandchildren. 17 total.    Right-handed   Caffeine: 1 cup every other day   Social Determinants of Health   Financial Resource Strain: Not on file  Food Insecurity: Not on file  Transportation Needs: Not on file  Physical Activity: Not on file  Stress: Not on file  Social Connections: Not on file  Intimate Partner Violence: Not on file      PHYSICAL EXAM  There were no vitals filed for this visit.  There is no height or weight on file to calculate BMI.  MMSE - Mini Mental State Exam 09/16/2018 09/02/2016 05/30/2016  Orientation to time 0 0 0  Orientation to time comments - - -  Orientation to Place 0 0 1  Orientation to Place-comments - - -  Registration 3 3 3   Attention/ Calculation 0 5 1  Recall 0 0 0  Language- name 2 objects 0 2 2  Language- repeat 0 1 0  Language- follow 3 step command 3 2 3   Language- read & follow direction 0 1 1  Write a sentence 1 0 1  Copy design 0 1 1  Total score 7 15 13      Generalized: Well developed, in no acute distress   Neurological examination  Mentation: Alert oriented to time, place, history taking. Follows all commands speech and language fluent Cranial nerve II-XII: Pupils were equal round reactive to light. Facial sensation and strength were normal. Uvula tongue midline. Head turning and shoulder shrug  were normal and symmetric. Motor: The motor testing reveals 5 over 5 strength of all 4 extremities. Good symmetric motor tone is noted throughout.  Sensory: Sensory testing is intact to soft touch on all 4 extremities. No evidence of extinction is noted.  Coordination: Cerebellar testing reveals some difficulty understanding directions with finger-nose-finger and heel-to-shin bilaterally.  Gait and station: Gait is normal.   DIAGNOSTIC DATA (LABS, IMAGING, TESTING) - I reviewed  patient records, labs, notes, testing and imaging myself where available.  Lab Results  Component Value Date   WBC 5.7 06/05/2020   HGB 14.0 06/05/2020   HCT 40.8 06/05/2020   MCV 89.9 06/05/2020   PLT 216 06/05/2020      Component Value Date/Time   NA 137 06/05/2020 1208   NA 141 11/19/2018 0952   K 3.4 (L) 06/05/2020 1208   CL 106 06/05/2020 1208   CO2 25 06/05/2020 1208   GLUCOSE 151 (H) 06/05/2020 1208   BUN 21 06/05/2020 1208   BUN 20 11/19/2018  2130   CREATININE 1.00 06/05/2020 1208   CREATININE 0.85 01/28/2014 1158   CALCIUM 8.8 (L) 06/05/2020 1208   PROT 7.0 06/05/2020 1208   PROT 7.2 02/13/2017 0905   ALBUMIN 3.3 (L) 06/05/2020 1208   ALBUMIN 4.2 02/13/2017 0905   AST 33 06/05/2020 1208   ALT 25 06/05/2020 1208   ALKPHOS 85 06/05/2020 1208   BILITOT 0.7 06/05/2020 1208   BILITOT 0.4 02/13/2017 0905   GFRNONAA 57 (L) 06/05/2020 1208   GFRNONAA 68 01/28/2014 1158   GFRAA 59 (L) 11/19/2018 0952   GFRAA 79 01/28/2014 1158   Lab Results  Component Value Date   CHOL 201 (H) 11/19/2018   HDL 45 11/19/2018   LDLCALC 120 (H) 11/19/2018   TRIG 204 (H) 11/19/2018   CHOLHDL 4.5 (H) 11/19/2018   Lab Results  Component Value Date   HGBA1C 5.9 (H) 11/19/2018   Lab Results  Component Value Date   QMVHQION62 952 05/30/2016   Lab Results  Component Value Date   TSH 0.964 11/14/2016      ASSESSMENT AND PLAN 80 y.o. year old female  has a past medical history of Bradycardia (2014 ), Cervical cancer ([facility]) (2014 ), Dementia ([place]), and HTN (hypertension) (2013). here with:  1.  Alzheimer's dementia   -Continue to monitor symptoms -Unable to complete MMSE today.  Score in 2020 was 7 out of 30.  Do not feel that restarting Aricept or Namenda will offer the patient any benefit. -Patient will follow-up on an as-needed basis    Ward Givens, MSN, NP-C 08/31/2020, 1:43 PM [facility] [address], [place], [place] [address] [phone]  Made any corrections needed, and agree with history, physical, neuro exam,assessment and plan as stated.     Sarina Ill, MD [facility]  agree with assessment and plan as stated.     Sarina Ill, MD [facility]

## 2020-09-14 ENCOUNTER — Telehealth: Payer: Self-pay | Admitting: *Deleted

## 2020-09-14 NOTE — Telephone Encounter (Signed)
Completed, please review, edit then sign.

## 2020-09-18 NOTE — Telephone Encounter (Signed)
Placed signed copy to medical records.

## 2020-09-19 ENCOUNTER — Telehealth: Payer: Self-pay | Admitting: *Deleted

## 2020-09-19 NOTE — Telephone Encounter (Signed)
Patient came to pick up disability paperwork unable to locate. Advised I will call her when Kara Dennis returns to the office.

## 2020-09-19 NOTE — Telephone Encounter (Signed)
Gave Pt Copy of disability paperwork but she is requesting original. Will discuss with Hilda Blades

## 2020-09-20 ENCOUNTER — Telehealth: Payer: Self-pay | Admitting: Adult Health

## 2020-09-20 NOTE — Telephone Encounter (Signed)
Called Pt daughter to come o/u original disability paperwork

## 2020-12-14 ENCOUNTER — Telehealth: Payer: Self-pay | Admitting: Adult Health

## 2020-12-14 NOTE — Telephone Encounter (Signed)
Agree. She should see PCP first

## 2020-12-14 NOTE — Telephone Encounter (Signed)
Spoke to son in law and will have pt seen by pcp first and then will call us if they feel like she needs to see [place].  He verbalized understanding.
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
## 2021-03-19 ENCOUNTER — Ambulatory Visit
Admission: RE | Admit: 2021-03-19 | Discharge: 2021-03-19 | Disposition: A | Discharge status: Home / Self Care | Payer: Medicaid Other | Source: Ambulatory Visit | Attending: Physician Assistant | Admitting: Physician Assistant

## 2021-03-19 DIAGNOSIS — M810 Age-related osteoporosis without current pathological fracture: Secondary | ICD-10-CM

## 2021-07-20 ENCOUNTER — Other Ambulatory Visit: Payer: Self-pay

## 2021-07-20 ENCOUNTER — Observation Stay (HOSPITAL_COMMUNITY)
Admission: EM | Admit: 2021-07-20 | Discharge: 2021-07-21 | Disposition: A | Discharge status: Home / Self Care | Payer: Medicaid Other | Attending: Internal Medicine | Admitting: Internal Medicine

## 2021-07-20 ENCOUNTER — Observation Stay (HOSPITAL_COMMUNITY): Payer: Medicaid Other

## 2021-07-20 ENCOUNTER — Emergency Department (HOSPITAL_COMMUNITY): Payer: Medicaid Other

## 2021-07-20 ENCOUNTER — Encounter (HOSPITAL_COMMUNITY): Payer: Self-pay | Admitting: Emergency Medicine

## 2021-07-20 DIAGNOSIS — I10 Essential (primary) hypertension: Secondary | ICD-10-CM | POA: Diagnosis present

## 2021-07-20 DIAGNOSIS — I129 Hypertensive chronic kidney disease with stage 1 through stage 4 chronic kidney disease, or unspecified chronic kidney disease: Secondary | ICD-10-CM | POA: Insufficient documentation

## 2021-07-20 DIAGNOSIS — Z79899 Other long term (current) drug therapy: Secondary | ICD-10-CM | POA: Diagnosis not present

## 2021-07-20 DIAGNOSIS — N189 Chronic kidney disease, unspecified: Secondary | ICD-10-CM

## 2021-07-20 DIAGNOSIS — N179 Acute kidney failure, unspecified: Secondary | ICD-10-CM | POA: Diagnosis not present

## 2021-07-20 DIAGNOSIS — F03A0 Unspecified dementia, mild, without behavioral disturbance, psychotic disturbance, mood disturbance, and anxiety: Secondary | ICD-10-CM

## 2021-07-20 DIAGNOSIS — R55 Syncope and collapse: Secondary | ICD-10-CM | POA: Diagnosis not present

## 2021-07-20 DIAGNOSIS — F0390 Unspecified dementia without behavioral disturbance: Secondary | ICD-10-CM | POA: Diagnosis not present

## 2021-07-20 DIAGNOSIS — E041 Nontoxic single thyroid nodule: Secondary | ICD-10-CM

## 2021-07-20 DIAGNOSIS — N1831 Chronic kidney disease, stage 3a: Secondary | ICD-10-CM | POA: Diagnosis not present

## 2021-07-20 DIAGNOSIS — M1712 Unilateral primary osteoarthritis, left knee: Secondary | ICD-10-CM | POA: Diagnosis not present

## 2021-07-20 DIAGNOSIS — Z8541 Personal history of malignant neoplasm of cervix uteri: Secondary | ICD-10-CM | POA: Diagnosis not present

## 2021-07-20 DIAGNOSIS — M1710 Unilateral primary osteoarthritis, unspecified knee: Secondary | ICD-10-CM

## 2021-07-20 LAB — TSH: TSH: 0.893 u[IU]/mL (ref 0.350–4.500)

## 2021-07-20 LAB — COMPREHENSIVE METABOLIC PANEL
ALT: 16 U/L (ref 0–44)
AST: 36 U/L (ref 15–41)
Albumin: 3.7 g/dL (ref 3.5–5.0)
Alkaline Phosphatase: 81 U/L (ref 38–126)
Anion gap: 11 (ref 5–15)
BUN: 40 mg/dL — ABNORMAL HIGH (ref 8–23)
CO2: 18 mmol/L — ABNORMAL LOW (ref 22–32)
Calcium: 9.5 mg/dL (ref 8.9–10.3)
Chloride: 108 mmol/L (ref 98–111)
Creatinine, Ser: 1.42 mg/dL — ABNORMAL HIGH (ref 0.44–1.00)
GFR, Estimated: 37 mL/min — ABNORMAL LOW (ref >60)
Glucose, Bld: 121 mg/dL — ABNORMAL HIGH (ref 70–99)
Potassium: 4.9 mmol/L (ref 3.5–5.1)
Sodium: 137 mmol/L (ref 135–145)
Total Bilirubin: 0.9 mg/dL (ref 0.3–1.2)
Total Protein: 7.3 g/dL (ref 6.5–8.1)

## 2021-07-20 LAB — RAPID URINE DRUG SCREEN, HOSP PERFORMED
Amphetamines: NOT DETECTED
Barbiturates: NOT DETECTED
Benzodiazepines: NOT DETECTED
Cocaine: NOT DETECTED
Opiates: NOT DETECTED
Tetrahydrocannabinol: NOT DETECTED

## 2021-07-20 LAB — MAGNESIUM: Magnesium: 2.1 mg/dL (ref 1.7–2.4)

## 2021-07-20 LAB — DIFFERENTIAL
Abs Immature Granulocytes: 0.02 10*3/uL (ref 0.00–0.07)
Basophils Absolute: 0 10*3/uL (ref 0.0–0.1)
Basophils Relative: 1 %
Eosinophils Absolute: 0.1 10*3/uL (ref 0.0–0.5)
Eosinophils Relative: 1 %
Immature Granulocytes: 0 %
Lymphocytes Relative: 15 %
Lymphs Abs: 1 10*3/uL (ref 0.7–4.0)
Monocytes Absolute: 0.4 10*3/uL (ref 0.1–1.0)
Monocytes Relative: 6 %
Neutro Abs: 4.9 10*3/uL (ref 1.7–7.7)
Neutrophils Relative %: 77 %

## 2021-07-20 LAB — CBG MONITORING, ED: Glucose-Capillary: 125 mg/dL — ABNORMAL HIGH (ref 70–99)

## 2021-07-20 LAB — URINALYSIS, ROUTINE W REFLEX MICROSCOPIC
Bilirubin Urine: NEGATIVE
Glucose, UA: NEGATIVE mg/dL
Hgb urine dipstick: NEGATIVE
Ketones, ur: NEGATIVE mg/dL
Nitrite: NEGATIVE
Protein, ur: NEGATIVE mg/dL
Specific Gravity, Urine: 1.015 (ref 1.005–1.030)
pH: 5 (ref 5.0–8.0)

## 2021-07-20 LAB — PROTIME-INR
INR: 1 (ref 0.8–1.2)
Prothrombin Time: 13.5 seconds (ref 11.4–15.2)

## 2021-07-20 LAB — I-STAT CHEM 8, ED
BUN: 54 mg/dL — ABNORMAL HIGH (ref 8–23)
Calcium, Ion: 1.16 mmol/L (ref 1.15–1.40)
Chloride: 109 mmol/L (ref 98–111)
Creatinine, Ser: 1.5 mg/dL — ABNORMAL HIGH (ref 0.44–1.00)
Glucose, Bld: 117 mg/dL — ABNORMAL HIGH (ref 70–99)
HCT: 40 % (ref 36.0–46.0)
Hemoglobin: 13.6 g/dL (ref 12.0–15.0)
Potassium: 5 mmol/L (ref 3.5–5.1)
Sodium: 137 mmol/L (ref 135–145)
TCO2: 21 mmol/L — ABNORMAL LOW (ref 22–32)

## 2021-07-20 LAB — TYPE AND SCREEN
ABO/RH(D): B POS
Antibody Screen: NEGATIVE

## 2021-07-20 LAB — CBC
HCT: 40.4 % (ref 36.0–46.0)
Hemoglobin: 14.3 g/dL (ref 12.0–15.0)
MCH: 32.9 pg (ref 26.0–34.0)
MCHC: 35.4 g/dL (ref 30.0–36.0)
MCV: 93.1 fL (ref 80.0–100.0)
Platelets: 191 10*3/uL (ref 150–400)
RBC: 4.34 MIL/uL (ref 3.87–5.11)
RDW: 12.7 % (ref 11.5–15.5)
WBC: 6.4 10*3/uL (ref 4.0–10.5)
nRBC: 0 % (ref 0.0–0.2)

## 2021-07-20 LAB — TROPONIN I (HIGH SENSITIVITY)
Troponin I (High Sensitivity): 13 ng/L (ref <18)
Troponin I (High Sensitivity): 12 ng/L (ref <18)

## 2021-07-20 LAB — APTT: aPTT: 25 seconds (ref 24–36)

## 2021-07-20 LAB — ETHANOL: Alcohol, Ethyl (B): <10 mg/dL (ref <10)

## 2021-07-20 LAB — ABO/RH: ABO/RH(D): B POS

## 2021-07-20 LAB — AMMONIA: Ammonia: 30 umol/L (ref 9–35)

## 2021-07-20 MED ORDER — ENOXAPARIN SODIUM 40 MG/0.4ML IJ SOSY
40.0000 mg | PREFILLED_SYRINGE | INTRAMUSCULAR | Status: DC
  Administered 2021-07-20: 40 mg via SUBCUTANEOUS
  Filled 2021-07-20: qty 0.4

## 2021-07-20 MED ORDER — ACETAMINOPHEN 650 MG RE SUPP: 650.0000 mg | Freq: Four times a day (QID) | RECTAL | Status: DC | PRN

## 2021-07-20 MED ORDER — SODIUM CHLORIDE 0.9 % IV SOLN: 100.0000 mL/h | INTRAVENOUS | Status: DC

## 2021-07-20 MED ORDER — ACETAMINOPHEN 325 MG PO TABS: 650.0000 mg | ORAL_TABLET | Freq: Four times a day (QID) | ORAL | Status: DC | PRN

## 2021-07-20 MED ORDER — SODIUM CHLORIDE 0.9 % IV SOLN: INTRAVENOUS | Status: AC

## 2021-07-20 MED ORDER — SODIUM CHLORIDE 0.9 % IV BOLUS
500.0000 mL | Freq: Once | INTRAVENOUS | Status: AC
  Administered 2021-07-20: 500 mL via INTRAVENOUS

## 2021-07-20 MED ORDER — SODIUM CHLORIDE 0.9% FLUSH
3.0000 mL | Freq: Two times a day (BID) | INTRAVENOUS | Status: DC
  Administered 2021-07-20 – 2021-07-21 (×2): 3 mL via INTRAVENOUS

## 2021-07-20 NOTE — Procedures (Signed)
Patient Name: Secilia Apps  ?MRN: [id number]  ?Epilepsy Attending: Lora Havens  ?Referring Physician/Provider: Orma Flaming, MD ?Date: 07/20/2021 ?Duration: 22.25 mins ? ?Patient history: 81yo F with ams. EEG to evaluate for seizure ? ?Level of alertness: Awake ? ?AEDs during EEG study: None ? ?Technical aspects: This EEG study was done with scalp electrodes positioned according to the 10-20 International system of electrode placement. Electrical activity was acquired at a sampling rate of '500Hz'$  and reviewed with a high frequency filter of '70Hz'$  and a low frequency filter of '1Hz'$ . EEG data were recorded continuously and digitally stored.  ? ?Description: The posterior dominant rhythm consists of 7 Hz activity of moderate voltage (25-35 uV) seen predominantly in posterior head regions, symmetric and reactive to eye opening and eye closing. EEG showed continuous generalized predominantly 5 to 7 Hz theta as well as intermittent 2-'3hz'$  delta slowing. Hyperventilation and photic stimulation were not performed.    ? ?ABNORMALITY ?- Continuous slow, generalized ? ?IMPRESSION: ?This study is suggestive of moderate diffuse encephalopathy, nonspecific etiology. No seizures or epileptiform discharges were seen throughout the recording. ? ?Lora Havens  ? ?

## 2021-07-20 NOTE — ED Notes (Signed)
RN unsuccessful at lab draw.  Phlebotomy attempted and was able to get 1 tube.  Pt will not tolerate attempts at lab draw even with family assistance.  ?

## 2021-07-20 NOTE — ED Notes (Signed)
Attempting to use [place] interpreter however patient not responding to interpreter questioning at this time.  ?

## 2021-07-20 NOTE — Assessment & Plan Note (Addendum)
Advanced disease. Refractory to medication ?Appears at baseline ?delirium precautions ?Asked for family to stay overnight if possible  ?

## 2021-07-20 NOTE — Assessment & Plan Note (Addendum)
Appears to be AKI, not CKD ?? Poor oral intake, seizure.  ?No hx of NSAID use  ?UA with no hgb ?On on nephrotoxic meds ?Will try IVF if she will tolerate ?Check urine studies  ?Strict I/O ?Trend  ?

## 2021-07-20 NOTE — ED Notes (Signed)
EEG in progress 

## 2021-07-20 NOTE — H&P (Signed)
?History and Physical  ? ? ?Patient: Kara Dennis TJQ:[id number] DOB: 1940-06-21 ?DOA: 07/20/2021 ?DOS: the patient was seen and examined on 07/20/2021 ?PCP: [facility].  ?Patient coming from: Home - lives with family  ? ? ?Chief Complaint: syncope ?Hx from family. Patient nonverbal at baseline  ? ?HPI: Kara Dennis is a 81 y.o. female with medical history significant of dementia, HTN, cervical cancer s/p hysterectomy who presented to ED with what family describes as possible syncopal event. she has had syncopal episodes over the past month and saw PCP at end of April for 2 syncopal events. She did fall on one of these events.  She was fatigued after both of these events. Family states today she was eating breakfast and her eyes rolled up and she put her hands on table and put her head down. She was non responsive for 5 minutes, never fell out of chair. After 5 minutes she "woke up." They were concerned since this was the third time this has happened. They deny any shaking, urinary loss or tongue biting.  ? ?Baseline dementia mostly nonverbal. Walks with help.  She was also released by neurology as she did not respond to typical agents and they believed that they wouldn't provide any therapeutic relief. But they feel like she has had decreased activity and interaction.  ? ?Family denies any recent illness. Her PO intake varies. She has needed help eating the past few days.  ? ?She does not smoke or drink alcohol.  ? ? ?ER Course:  vitals: temp: 97.1, bp: 107/69, HR: 102, RR: 18, oxygen: 98%RA ?Pertinent labs: BUN: 40, creatinine: 1.42,  ?CXR: no acute process. Cardiomegaly without CHF ?CT head: no acute finding.  ?CT cervical spine: no acute fx or subluxation. Multiple thyroid nodules measuring up to 1.5cm, recommend thyroid US.  ?In ED: given 500cc bolus ? ? ? ?Review of Systems: unable to review all systems due to the inability of the patient to answer questions. ?Past Medical History:  ?Diagnosis  Date  ? Bradycardia 2014   ? identified before hysterectomy, treated for short time with medication before during and after hysterectomy   ? Cervical cancer [facility]) 2014   ? s/p hysterectomy, no chemo or radiation   ? Dementia ([place])   ? HTN (hypertension) 2013  ? took some medicine in [place]   ? ?Past Surgical History:  ?Procedure Laterality Date  ? ABDOMINAL HYSTERECTOMY  05/2012   ? ?Social History:  reports that she has never smoked. She has never used smokeless tobacco. She reports that she does not drink alcohol and does not use drugs. ? ?No Known Allergies ? ?Family History  ?Problem Relation Age of Onset  ? Diabetes Brother   ? Heart disease Neg Hx   ? Cancer Neg Hx   ? Dementia Neg Hx   ? Breast cancer Neg Hx   ? ? ?Prior to Admission medications   ?Medication Sig Start Date End Date Taking? Authorizing Provider  ?amLODipine (NORVASC) 2.5 MG tablet Take 1 tablet (2.5 mg total) by mouth daily. To lower blood pressure 11/19/18   Fulp, Cammie, MD  ?memantine (NAMENDA) 10 MG tablet Take 1 tablet (10 mg total) by mouth 2 (two) times daily. 03/31/19   Ward Givens, NP  ?Vitamin D, Ergocalciferol, (DRISDOL) 1.25 MG (50000 UT) CAPS capsule Take 1 capsule (50,000 Units total) by mouth every 7 (seven) days. 11/24/18   Antony Blackbird, MD  ? ? ?Physical Exam: ?Vitals:  ? 07/20/21 1430  07/20/21 1500 07/20/21 1600 07/20/21 2014  ?BP: (!) 138/91 129/80 108/69 110/67  ?Pulse: 87 85 87 90  ?Resp: '18 16 16 17  '$ ?Temp:      ?TempSrc:      ?SpO2: 100% 99% 98% 99%  ?Weight:      ?Height:      ? ?General:  Appears calm and comfortable and is in NAD ?Eyes:  PERRL, EOMI, normal lids, iris ?ENT:  grossly normal hearing, lips & tongue, mmm; no teeth ?Neck:  no LAD, masses or thyromegaly; no carotid bruits ?Cardiovascular:  RRR, no m/r/g. No LE edema.  ?Respiratory:   CTA bilaterally with no wheezes/rales/rhonchi.  Normal respiratory effort. ?Abdomen:  soft, NT, ND, NABS ?Back:   normal alignment, no CVAT ?Skin:  no rash or induration seen  on limited exam. Bruising to left temple and left knee  ?Musculoskeletal:  grossly normal tone BUE/BLE, no bony abnormality. She has edema and bruising to left knee.  ?Lower extremity:  No LE edema.  Limited foot exam with no ulcerations.  2+ distal pulses. ?Psychiatric:  nonverbal at baseline  ?Neurologic:  can not test  ? ? ?Radiological Exams on Admission: ?Independently reviewed - see discussion in A/P where applicable ? ?DG Knee 2 Views Left ? ?Result Date: 07/20/2021 ?CLINICAL DATA:  Altered mental status.  Bruising around the knee. EXAM: LEFT KNEE - 1-2 VIEW COMPARISON:  None Available. FINDINGS: No evidence for an acute fracture. No dislocation. There may be a tiny joint effusion. Degenerative spurring is noted in all 3 compartments. Bones are demineralized. No worrisome lytic or sclerotic osseous abnormality. IMPRESSION: 1. No acute bony abnormality. 2. Tricompartmental degenerative changes with probable tiny joint effusion. Electronically Signed   By: Misty Stanley M.D.   On: 07/20/2021 13:20  ? ?CT HEAD WO CONTRAST ? ?Result Date: 07/20/2021 ?CLINICAL DATA:  Neuro deficit, acute, stroke suspected; Neck trauma (Age >= 65y). Altered mental status. Possible syncopal episode. EXAM: CT HEAD WITHOUT CONTRAST CT CERVICAL SPINE WITHOUT CONTRAST TECHNIQUE: Multidetector CT imaging of the head and cervical spine was performed following the standard protocol without intravenous contrast. Multiplanar CT image reconstructions of the cervical spine were also generated. RADIATION DOSE REDUCTION: This exam was performed according to the departmental dose-optimization program which includes automated exposure control, adjustment of the mA and/or kV according to patient size and/or use of iterative reconstruction technique. COMPARISON:  Head CT 12/14/2017 FINDINGS: CT HEAD FINDINGS Brain: There is no evidence of an acute infarct, intracranial hemorrhage, mass, midline shift, or extra-axial fluid collection. Patchy to  confluent hypodensities in the cerebral white matter bilaterally may have mildly progressed and are nonspecific but compatible with extensive chronic small vessel ischemic disease. There is mild-to-moderate cerebral atrophy. Vascular: Calcified atherosclerosis at the skull base. No hyperdense vessel. Skull: No acute fracture or suspicious osseous lesion. Sinuses/Orbits: Mucosal thickening in the right maxillary and left sphenoid sinuses. Clear mastoid air cells. Unremarkable orbits. Other: None. CT CERVICAL SPINE FINDINGS Alignment: Cervical spine straightening.  No listhesis. Skull base and vertebrae: No acute fracture or suspicious osseous lesion. Soft tissues and spinal canal: No prevertebral fluid or swelling. No visible canal hematoma. Disc levels: Disc degeneration greatest at C4-5 where disc bulging and asymmetric right uncovertebral spurring result in mild spinal stenosis and mild right neural foraminal stenosis. Upper chest: Biapical lung scarring. Other: Mildly enlarged and heterogeneous thyroid gland with multiple underlying nodules which are difficult to accurately measure due to motion artifact, although a calcified right-sided nodule measures 1.5 cm. IMPRESSION:  1. No evidence of acute intracranial abnormality. 2. Extensive chronic small vessel ischemic disease. 3. No evidence of acute cervical spine fracture or subluxation. 4. Multiple thyroid nodules measuring up to at least 1.5 cm. Recommend thyroid US (ref: J Am Coll Radiol. 2015 Feb;12(2): 143-50). Electronically Signed   By: Logan Bores M.D.   On: 07/20/2021 13:33  ? ?CT Cervical Spine Wo Contrast ? ?Result Date: 07/20/2021 ?CLINICAL DATA:  Neuro deficit, acute, stroke suspected; Neck trauma (Age >= 65y). Altered mental status. Possible syncopal episode. EXAM: CT HEAD WITHOUT CONTRAST CT CERVICAL SPINE WITHOUT CONTRAST TECHNIQUE: Multidetector CT imaging of the head and cervical spine was performed following the standard protocol without  intravenous contrast. Multiplanar CT image reconstructions of the cervical spine were also generated. RADIATION DOSE REDUCTION: This exam was performed according to the departmental dose-optimization program which inc

## 2021-07-20 NOTE — Assessment & Plan Note (Signed)
On no medication and hx of syncope with orthostasis in the past ?Continue to monitor, f/u on orthostatic vitals  ?

## 2021-07-20 NOTE — ED Triage Notes (Signed)
Patient BIB [facility] from home. Per family, EMS was called because while sitting at home family witnessed patient's "eyes roll to the back of the head" and was unresponsive having what seems like a syncopal episode. Pt's family also concerned that patient has been acting more altered then usual. Pt at baseline has some cognitive deficits, put per EMS and family patient has been acting "more strange lately" and having a decrease in normal activity. Pt was orthostatic per EMS. EMS unable to obtain IV access due to patient not cooperating during transport and becoming mildly agitated and pulling off equipment. Pt speaks [place].  ?

## 2021-07-20 NOTE — Assessment & Plan Note (Addendum)
Incidental finding on CT ?TSH wnl,  thyroid US pending  ?

## 2021-07-20 NOTE — Progress Notes (Signed)
EEG complete - results pending 

## 2021-07-20 NOTE — ED Provider Notes (Signed)
?[facility] ?Provider Note ? ? ?CSN: [id number] ?Arrival date & time: 07/20/21  1229 ? ?  ? ?History ? ?Chief Complaint  ?Patient presents with  ? Altered Mental Status  ? Loss of Consciousness  ? ? ?Kara Dennis is a 81 y.o. female. ? ? ?Altered Mental Status ?Loss of Consciousness ? ?Patient has history of dementia, hypertension, bradycardia, cervical cancer who presents to the ED for altered mental status.  The history is very limited.  Patient according to the records speaks [place].  We used a [place] and the patient did not respond to any other questions.  She is alert and awake and was looking at the screen but did not verbally respond.  According to the EMS report patient was sitting at home and then her eyes rolled into the back of her head.  She was unresponsive.  Unclear of the exact duration.  Family is not present at the bedside at this time to provide additional history.  Family also told EMS that the patient has been acting more altered than usual.  She has not been as active. ? ?Home Medications ?Prior to Admission medications   ?Medication Sig Start Date End Date Taking? Authorizing Provider  ?amLODipine (NORVASC) 2.5 MG tablet Take 1 tablet (2.5 mg total) by mouth daily. To lower blood pressure 11/19/18   Fulp, Cammie, MD  ?memantine (NAMENDA) 10 MG tablet Take 1 tablet (10 mg total) by mouth 2 (two) times daily. 03/31/19   Ward Givens, NP  ?Vitamin D, Ergocalciferol, (DRISDOL) 1.25 MG (50000 UT) CAPS capsule Take 1 capsule (50,000 Units total) by mouth every 7 (seven) days. 11/24/18   Antony Blackbird, MD  ?   ? ?Allergies    ?Patient has no known allergies.   ? ?Review of Systems   ?Review of Systems  ?Unable to perform ROS: Acuity of condition  ?Cardiovascular:  Positive for syncope.  ? ?Physical Exam ?Updated Vital Signs ?BP 129/80   Pulse 85   Temp (!) 97.1 ?F (36.2 ?C) (Axillary)   Resp 16   Ht 1.549 m ('5\' 1"'$ )   Wt 64 kg   SpO2 99%    BMI 26.66 kg/m?  ?Physical Exam ?Vitals and nursing note reviewed.  ?Constitutional:   ?   Appearance: She is well-developed. She is not diaphoretic.  ?   Comments: Elderly, frail  ?HENT:  ?   Head: Normocephalic.  ?   Comments: Bruising noted over the left forehead ?   Right Ear: External ear normal.  ?   Left Ear: External ear normal.  ?Eyes:  ?   General: No scleral icterus.    ?   Right eye: No discharge.     ?   Left eye: No discharge.  ?   Conjunctiva/sclera: Conjunctivae normal.  ?Neck:  ?   Trachea: No tracheal deviation.  ?Cardiovascular:  ?   Rate and Rhythm: Normal rate and regular rhythm.  ?Pulmonary:  ?   Effort: Pulmonary effort is normal. No respiratory distress.  ?   Breath sounds: Normal breath sounds. No stridor. No wheezing or rales.  ?Abdominal:  ?   General: Bowel sounds are normal. There is no distension.  ?   Palpations: Abdomen is soft.  ?   Tenderness: There is no abdominal tenderness. There is no guarding or rebound.  ?Musculoskeletal:     ?   General: Tenderness present. No deformity.  ?   Cervical back: Neck supple.  ?  Comments: Bruising noted around left knee  ?Skin: ?   General: Skin is warm and dry.  ?   Findings: No rash.  ?Neurological:  ?   Sensory: No sensory deficit.  ?   Motor: No abnormal muscle tone or seizure activity.  ?   Comments: No facial droop noted, patient does track across the midline, she does move all extremities in response to exam and painful stimuli, patient does not answer any questions, does not comply with exam  ?Psychiatric:     ?   Mood and Affect: Mood normal.  ? ? ?ED Results / Procedures / Treatments   ?Labs ?(all labs ordered are listed, but only abnormal results are displayed) ?Labs Reviewed  ?COMPREHENSIVE METABOLIC PANEL - Abnormal; Notable for the following components:  ?    Result Value  ? CO2 18 (*)   ? Glucose, Bld 121 (*)   ? BUN 40 (*)   ? Creatinine, Ser 1.42 (*)   ? GFR, Estimated 37 (*)   ? All other components within normal limits   ?URINALYSIS, ROUTINE W REFLEX MICROSCOPIC - Abnormal; Notable for the following components:  ? APPearance HAZY (*)   ? Leukocytes,Ua TRACE (*)   ? Bacteria, UA FEW (*)   ? All other components within normal limits  ?CBG MONITORING, ED - Abnormal; Notable for the following components:  ? Glucose-Capillary 125 (*)   ? All other components within normal limits  ?I-STAT CHEM 8, ED - Abnormal; Notable for the following components:  ? BUN 54 (*)   ? Creatinine, Ser 1.50 (*)   ? Glucose, Bld 117 (*)   ? TCO2 21 (*)   ? All other components within normal limits  ?ETHANOL  ?PROTIME-INR  ?APTT  ?CBC  ?DIFFERENTIAL  ?RAPID URINE DRUG SCREEN, HOSP PERFORMED  ?AMMONIA  ?TSH  ?TYPE AND SCREEN  ?ABO/RH  ? ? ?EKG ?EKG Interpretation ? ?Date/Time:  Friday Jul 20 2021 12:32:55 EDT ?Ventricular Rate:  103 ?PR Interval:  176 ?QRS Duration: 77 ?QT Interval:  368 ?QTC Calculation: 482 ?R Axis:   45 ?Text Interpretation: Sinus tachycardia Since last tracing rate faster Confirmed by Dorie Rank [phone]) on 07/20/2021 1:08:16 PM ? ?Radiology ?DG Knee 2 Views Left ? ?Result Date: 07/20/2021 ?CLINICAL DATA:  Altered mental status.  Bruising around the knee. EXAM: LEFT KNEE - 1-2 VIEW COMPARISON:  None Available. FINDINGS: No evidence for an acute fracture. No dislocation. There may be a tiny joint effusion. Degenerative spurring is noted in all 3 compartments. Bones are demineralized. No worrisome lytic or sclerotic osseous abnormality. IMPRESSION: 1. No acute bony abnormality. 2. Tricompartmental degenerative changes with probable tiny joint effusion. Electronically Signed   By: Misty Stanley M.D.   On: 07/20/2021 13:20  ? ?CT HEAD WO CONTRAST ? ?Result Date: 07/20/2021 ?CLINICAL DATA:  Neuro deficit, acute, stroke suspected; Neck trauma (Age >= 65y). Altered mental status. Possible syncopal episode. EXAM: CT HEAD WITHOUT CONTRAST CT CERVICAL SPINE WITHOUT CONTRAST TECHNIQUE: Multidetector CT imaging of the head and cervical spine was performed  following the standard protocol without intravenous contrast. Multiplanar CT image reconstructions of the cervical spine were also generated. RADIATION DOSE REDUCTION: This exam was performed according to the departmental dose-optimization program which includes automated exposure control, adjustment of the mA and/or kV according to patient size and/or use of iterative reconstruction technique. COMPARISON:  Head CT 12/14/2017 FINDINGS: CT HEAD FINDINGS Brain: There is no evidence of an acute infarct, intracranial hemorrhage, mass, midline shift, or  extra-axial fluid collection. Patchy to confluent hypodensities in the cerebral white matter bilaterally may have mildly progressed and are nonspecific but compatible with extensive chronic small vessel ischemic disease. There is mild-to-moderate cerebral atrophy. Vascular: Calcified atherosclerosis at the skull base. No hyperdense vessel. Skull: No acute fracture or suspicious osseous lesion. Sinuses/Orbits: Mucosal thickening in the right maxillary and left sphenoid sinuses. Clear mastoid air cells. Unremarkable orbits. Other: None. CT CERVICAL SPINE FINDINGS Alignment: Cervical spine straightening.  No listhesis. Skull base and vertebrae: No acute fracture or suspicious osseous lesion. Soft tissues and spinal canal: No prevertebral fluid or swelling. No visible canal hematoma. Disc levels: Disc degeneration greatest at C4-5 where disc bulging and asymmetric right uncovertebral spurring result in mild spinal stenosis and mild right neural foraminal stenosis. Upper chest: Biapical lung scarring. Other: Mildly enlarged and heterogeneous thyroid gland with multiple underlying nodules which are difficult to accurately measure due to motion artifact, although a calcified right-sided nodule measures 1.5 cm. IMPRESSION: 1. No evidence of acute intracranial abnormality. 2. Extensive chronic small vessel ischemic disease. 3. No evidence of acute cervical spine fracture or  subluxation. 4. Multiple thyroid nodules measuring up to at least 1.5 cm. Recommend thyroid US (ref: J Am Coll Radiol. 2015 Feb;12(2): 143-50). Electronically Signed   By: Logan Bores M.D.   On: 07/20/2021 13:33  ? ?CT

## 2021-07-20 NOTE — Assessment & Plan Note (Signed)
81 year old female with history of 3 episodes of questionable syncopal episodes. Today's episode sounds like she was in control of her body and placed her head down, but history of collapse with prior events.  ?-obs to tele ?-CT head with no acute finding  ?-check orthostatic vitals, has had orthostatic hypotension in past ?-check echo ?-EEG adult to r/o seizure ?-check troponin since she can't give history, ekg reassuring  ?-neuro checks, limited as she can not follow directions ?-IVF ?-TED hose  ?

## 2021-07-21 ENCOUNTER — Other Ambulatory Visit (HOSPITAL_COMMUNITY): Payer: Medicaid Other

## 2021-07-21 DIAGNOSIS — F03A0 Unspecified dementia, mild, without behavioral disturbance, psychotic disturbance, mood disturbance, and anxiety: Secondary | ICD-10-CM | POA: Diagnosis not present

## 2021-07-21 LAB — GLUCOSE, CAPILLARY: Glucose-Capillary: 99 mg/dL (ref 70–99)

## 2021-07-21 LAB — BASIC METABOLIC PANEL
Anion gap: 9 (ref 5–15)
BUN: 36 mg/dL — ABNORMAL HIGH (ref 8–23)
CO2: 17 mmol/L — ABNORMAL LOW (ref 22–32)
Calcium: 8.8 mg/dL — ABNORMAL LOW (ref 8.9–10.3)
Chloride: 111 mmol/L (ref 98–111)
Creatinine, Ser: 1.23 mg/dL — ABNORMAL HIGH (ref 0.44–1.00)
GFR, Estimated: 44 mL/min — ABNORMAL LOW (ref >60)
Glucose, Bld: 91 mg/dL (ref 70–99)
Potassium: 4.2 mmol/L (ref 3.5–5.1)
Sodium: 137 mmol/L (ref 135–145)

## 2021-07-21 MED ORDER — CEPHALEXIN 500 MG PO CAPS: 500.0000 mg | ORAL_CAPSULE | Freq: Three times a day (TID) | ORAL | 0 refills | Status: AC

## 2021-07-21 NOTE — Progress Notes (Signed)
Physical Therapy Treatment and Discharge ?Patient Details ?Name: Kara Dennis ?MRN: [id number] ?DOB: 25-Jun-1940 ?Today's Date: 07/21/2021 ? ? ?History of Present Illness 81 y.o. female who presented to ED with what family describes as possible syncopal event. CT head negative; EEG negative; PMH significant of dementia, HTN, cervical cancer s/p hysterectomy ? ?  ?PT Comments  ? ? Patient evaluated by Physical Therapy with no further acute PT needs identified. Daughter present and able to provided assist necessary to get OOB and ambulate to bathroom, assist pt with toileting, and then return pt to bed.  PT is signing off. Thank you for this referral. ?   ?Recommendations for follow up therapy are one component of a multi-disciplinary discharge planning process, led by the attending physician.  Recommendations may be updated based on patient status, additional functional criteria and insurance authorization. ? ?Follow Up Recommendations ? No PT follow up ?  ?  ?Assistance Recommended at Discharge Frequent or constant Supervision/Assistance  ?Patient can return home with the following A little help with walking and/or transfers;A lot of help with bathing/dressing/bathroom;Assistance with cooking/housework;Assistance with feeding;Direct supervision/assist for medications management;Direct supervision/assist for financial management;Help with stairs or ramp for entrance ?  ?Equipment Recommendations ? None recommended by PT  ?  ?Recommendations for Other Services   ? ? ?  ?Precautions / Restrictions Precautions ?Precautions: Other (comment) ?Precaution Comments: risk of syncope  ?  ? ?Mobility ? Bed Mobility ?Overal bed mobility: Needs Assistance ?Bed Mobility: Supine to Sit, Sit to Supine ?  ?  ?Supine to sit: Min assist ?Sit to supine: Min assist ?  ?General bed mobility comments: daughter provided assist as she does at home ?  ? ?Transfers ?Overall transfer level: Needs assistance ?Equipment used: 1 person hand held  assist ?Transfers: Sit to/from Stand ?Sit to Stand: Min assist ?  ?  ?  ?  ?  ?General transfer comment: from EOB and from standard toilet ?  ? ?Ambulation/Gait ?Ambulation/Gait assistance: Min assist ?Gait Distance (Feet): 12 Feet (toileted, 12) ?Assistive device: 1 person hand held assist ?Gait Pattern/deviations: Shuffle ?  ?  ?  ?General Gait Details: Daughter provides assist as at home ? ? ?Stairs ?  ?  ?  ?  ?  ? ? ?Wheelchair Mobility ?  ? ?Modified Rankin (Stroke Patients Only) ?  ? ? ?  ?Balance Overall balance assessment: No apparent balance deficits (not formally assessed) ?  ?  ?  ?  ?  ?  ?  ?  ?  ?  ?  ?  ?  ?  ?  ?  ?  ?  ?  ? ?  ?Cognition Arousal/Alertness: Awake/alert ?Behavior During Therapy: [facility] for tasks assessed/performed ?Overall Cognitive Status: History of cognitive impairments - at baseline ?  ?  ?  ?  ?  ?  ?  ?  ?  ?  ?  ?  ?  ?  ?  ?  ?General Comments: Daughter present and reports pt at baseline ?  ?  ? ?  ?Exercises   ? ?  ?General Comments   ?  ?  ? ?Pertinent Vitals/Pain Pain Assessment ?Pain Assessment: Faces ?Faces Pain Scale: No hurt ?Breathing: normal ?Negative Vocalization: none ?Facial Expression: smiling or inexpressive ?Body Language: relaxed ?Consolability: no need to console ?PAINAD Score: 0  ? ? ?Home Living Family/patient expects to be discharged to:: Private residence ?Living Arrangements: Children ?Available Help at Discharge: Family ?Type of Home: House ?Home Access: Stairs to  enter ?Entrance Stairs-Rails: None ?Entrance Stairs-Number of Steps: 4 ?Alternate Level Stairs-Number of Steps: flight ?Home Layout: Two level ?Home Equipment: Grab bars - tub/shower ?Additional Comments: home setup from previous admission  ?  ?Prior Function    ?  ?  ?   ? ?PT Goals (current goals can now be found in the care plan section) Acute Rehab PT Goals ?PT Goal Formulation: All assessment and education complete, DC therapy ? ?  ?Frequency ? ? ?   ? ? ? ?  ?PT Plan    ? ? ?Co-evaluation    ?  ?  ?  ?  ? ?  ?AM-PAC PT "6 Clicks" Mobility   ?Outcome Measure ? Help needed turning from your back to your side while in a flat bed without using bedrails?: A Lot ?Help needed moving from lying on your back to sitting on the side of a flat bed without using bedrails?: A Little ?Help needed moving to and from a bed to a chair (including a wheelchair)?: A Little ?Help needed standing up from a chair using your arms (e.g., wheelchair or bedside chair)?: A Little ?Help needed to walk in hospital room?: A Little ?Help needed climbing 3-5 steps with a railing? : A Little ?6 Click Score: 17 ? ?  ?End of Session   ?Activity Tolerance: Patient tolerated treatment well ?Patient left: in bed;with call bell/phone within reach;with bed alarm set;with nursing/sitter in room;with family/visitor present ?Nurse Communication: Mobility status ?PT Visit Diagnosis: Difficulty in walking, not elsewhere classified (R26.2) ?  ? ? ?Time: 0981-1914 ?PT Time Calculation (min) (ACUTE ONLY): 21 min ? ?Charges:             ?          ? ? ?Arby Barrette, PT ?Acute Rehabilitation Services  ?Pager [phone] ?Office [phone] ? ? ? ?Kara Dennis ?07/21/2021, 8:05 AM ? ?

## 2021-07-21 NOTE — Discharge Instructions (Signed)
Follow with Primary MD Ong. in 7 days  ? ?Get CBC, CMP, 2 view Chest X ray, thyroid ultrasound, echocardiogram-  checked next visit within 1 week by Primary MD  ? ?Activity: As tolerated with Full fall precautions use walker/cane & assistance as needed ? ?Disposition Home  ? ?Diet: Soft Heart Healthy with feeding assistance and aspiration precautions. ?  ? ?Special Instructions: If you have smoked or chewed Tobacco  in the last 2 yrs please stop smoking, stop any regular Alcohol  and or any Recreational drug use. ? ?On your next visit with your primary care physician please Get Medicines reviewed and adjusted. ? ?Please request your Prim.MD to go over all Hospital Tests and Procedure/Radiological results at the follow up, please get all Hospital records sent to your Prim MD by signing hospital release before you go home. ? ?If you experience worsening of your admission symptoms, develop shortness of breath, life threatening emergency, suicidal or homicidal thoughts you must seek medical attention immediately by calling 911 or calling your MD immediately  if symptoms less severe. ? ?You Must read complete instructions/literature along with all the possible adverse reactions/side effects for all the Medicines you take and that have been prescribed to you. Take any new Medicines after you have completely understood and accpet all the possible adverse reactions/side effects.  ? ?  ?

## 2021-07-21 NOTE — TOC Transition Note (Signed)
Transition of Care (TOC) - CM/SW Discharge Note ? ? ?Patient Details  ?Name: Kara Dennis ?MRN: [id number] ?Date of Birth: 07-Jun-1940 ? ?Transition of Care (TOC) CM/SW Contact:  ?Carles Collet, RN ?Phone Number: ?07/21/2021, 1:15 PM ? ? ?Clinical Narrative:    ?Patient from home with family who will provide support after DC. Eval from PT recommends no follow up or DME. Patient would also not qualify for [facility] per medicaid guidelines. Nurse verified RW not needed. Family will provide ride after 6:30 pm, verified w TOC administration patient to wait for her own transportation home. ? ? ? ?Final next level of care: Home/Self Care ?Barriers to Discharge: No Barriers Identified ? ? ?Patient Goals and CMS Choice ?  ?  ?  ? ?Discharge Placement ?  ?           ?  ?  ?  ?  ? ?Discharge Plan and Services ?  ?  ?           ?  ?  ?  ?  ?  ?  ?  ?  ?  ?  ? ?Social Determinants of Health (SDOH) Interventions ?  ? ? ?Readmission Risk Interventions ?   ? View : No data to display.  ?  ?  ?  ? ? ? ? ? ?

## 2021-07-21 NOTE — Progress Notes (Signed)
Occupational Therapy Evaluation ?Patient Details ?Name: Kara Dennis ?MRN: [id number] ?DOB: Mar 22, 1940 ?Today's Date: 07/21/2021 ? ? ?History of Present Illness 81 y.o. female who presented to ED with what family describes as possible syncopal event. CT head negative; EEG negative; PMH significant of dementia, HTN, cervical cancer s/p hysterectomy  ? ?Clinical Impression ?   ? Spoke with pt's physical therapist and pt's daughter. 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  Pt's daughter is ESL but able to effectively describe how she cares for the pt at home with M-F assistance from her sister in law. Daughter reports that pt requires simple language and "care like a baby". Daughter seems to have a very good understanding of pt's needs.  ?Daughter denied any further questions and agrees for acute OT to sign off.   ? ?Recommendations for follow up therapy are one component of a multi-disciplinary discharge planning process, led by the attending physician.  Recommendations may be updated based on patient status, additional functional criteria and insurance authorization.  ? ?Follow Up Recommendations ?   No post-acute OT.  Pt may benefit from outpatient vs home health SLP for cognitive and caregiver training.  Pt's daughter educated on this.  ?  ?Assistance Recommended at Discharge  Baseline from family.  ?   ?   ?   ?Recommendations for Other Services   Possible SLP for cognitive training. ? ? ?  ?Precautions / Restrictions Precautions ?Precautions: Other (comment) ?Precaution Comments: risk of syncope  ? ?  ? ?Communication Communication ?Communication: Prefers language other than English;Expressive difficulties (non-verbal per daughter (daughter speaks [place] and [place])) ?  ?Cognition   Confused, alert, repeatedly spitting which daughter states is normal.  ?  ?  ?  ?  ?  ?   ?  ?  ?  ?  ?  ?  ?  ?  ?  ?  ?  ?  ?  ?  ?  ?    ? ?  ?   ?  ?    ? ? ?Home Living Family/patient expects to be discharged to:: Private residence ?Living Arrangements: Children ?Available Help at Discharge: Family ?Type of Home: House ?Home Access: Stairs to enter ?Entrance Stairs-Number of Steps: 4 ?Entrance Stairs-Rails: None ?Home Layout: Two level ?Alternate Level Stairs-Number of Steps: flight ?Alternate Level Stairs-Rails: Left ?Bathroom Shower/Tub: Walk-in shower ?  ?Bathroom Toilet: Standard ?  ?  ?Home Equipment: Grab bars - tub/shower ?  ?Additional Comments: home setup from previous admission ?  ? ?  ?Prior Functioning/Environment Prior Level of Function : Needs assist ? Cognitive Assist : Mobility (cognitive);ADLs (cognitive) ?Mobility (Cognitive): Step by step cues ?ADLs (Cognitive): Step by step cues ?  ?  ?  ?Mobility Comments: Daughter cues and assists pt OOB with apparent min assist; transfer and gait with min assist (HHA) ?ADLs Comments: Daughter does all ADLs for pt ?  ? ?  ?  ? Anderson Malta, OT ?Acute Rehab Services ?Office: [phone] ?07/21/2021 ? ?Kara Dennis ?07/21/2021, 10:48 AM ?

## 2021-07-21 NOTE — Discharge Summary (Signed)
?                                                                                ? ?Kara Dennis WUJ:[id number] DOB: November 30, 1940 DOA: 07/20/2021 ? ?PCP: [place]. ? ?Admit date: 07/20/2021  Discharge date: 07/21/2021 ? ?Admitted From: Home   Disposition:  Home ? ? ?Recommendations for Outpatient Follow-up:  ? ?Follow up with PCP in 1-2 weeks ? ?PCP Please obtain BMP/CBC, 2 view CXR in 1week,  (see Discharge instructions)  ? ?PCP Please follow up on the following pending results: Please obtain outpatient thyroid ultrasound and echocardiogram.  If her neurological symptoms continue then outpatient neurology follow-up. ? ? ?Home Health: PT, RN if she qualifies ?Equipment/Devices: Rolling walker ?Consultations: None  ?Discharge Condition: Stable    ?CODE STATUS: Full    ?Diet Recommendation: Soft Heart Healthy  ? ?Diet Order   ? ?       ?  Diet - low sodium heart healthy       ?  ?  Diet regular Room service appropriate? Yes; Fluid consistency: Thin  Diet effective now       ?  ? ?  ?  ? ?  ?  ? ?Chief Complaint  ?Patient presents with  ? Altered Mental Status  ? Loss of Consciousness  ?  ? ?Brief history of present illness from the day of admission and additional interim summary   ? ? 81 y.o. female with medical history significant of dementia, HTN, cervical cancer s/p hysterectomy who presented to ED with what family describes as possible syncopal event. she has had syncopal episodes over the past month and saw PCP at end of April for 2 syncopal events. She did fall on one of these events.  She was fatigued after both of these events. Family states today she was eating breakfast and her eyes rolled up and she put her hands on table and put her head down. She was non responsive for 5 minutes, never fell out of chair. After 5 minutes she "woke up." They were concerned since this was the third time this has happened.  They deny any shaking, urinary loss or tongue biting.  ?  ?Baseline dementia mostly nonverbal. Walks with help.  She was also released by neurology as she did not respond to typical agents and they believed that they wouldn't provide any therapeutic relief.  She was admitted to the hospital for dehydration and syncope work-up. ? ?                                                               Hospital Course  ? ?  ?Syncope in a 81 year old with advanced dementia baseline nonverbal, previous outpatient neurology work-up, CT head and CTA along with EEG unremarkable, no focal deficits, with IV fluids for hydration she is back to her baseline, worked with PT, per daughter she looks great and she would like to take her home.  Will  request PCP to kindly obtain outpatient echocardiogram, if symptoms recur then she might benefit from outpatient 30-day event monitor and neurology evaluation by a neurologist. ? ?AKI.  Due to dehydration.  On top of CKD 3A.  With hydration creatinine back to baseline of around 1.3. ? ?Incidental findings of multiple thyroid nodules.  PCP to check outpatient thyroid ultrasound. ? ? ?Dementia, almost nonverbal at baseline.  Refractory to medications.  Home regimen.  Outpatient neurology follow-up. ? ?UTI.  Finished Keflex treatment x3 days. ? ?Discharge diagnosis   ? ? ?Principal Problem: ?  Syncope ?Active Problems: ?  AKI (acute kidney injury) ([place]) ?  Thyroid nodule ?  HTN (hypertension) ?  Dementia (English) ? ? ? ?Discharge instructions   ? ?Discharge Instructions   ? ? Diet - low sodium heart healthy   Complete by: As directed ?  ? Discharge instructions   Complete by: As directed ?  ? Follow with Primary MD [place]. in 7 days  ? ?Get CBC, CMP, 2 view Chest X ray, thyroid ultrasound, echocardiogram-  checked next visit within 1 week by Primary MD  ? ?Activity: As tolerated with Full fall precautions use walker/cane & assistance as needed ? ?Disposition Home  ? ?Diet: Soft  Heart Healthy with feeding assistance and aspiration precautions. ?  ? ?Special Instructions: If you have smoked or chewed Tobacco  in the last 2 yrs please stop smoking, stop any regular Alcohol  and or any Recreational drug use. ? ?On your next visit with your primary care physician please Get Medicines reviewed and adjusted. ? ?Please request your Prim.MD to go over all Hospital Tests and Procedure/Radiological results at the follow up, please get all Hospital records sent to your Prim MD by signing hospital release before you go home. ? ?If you experience worsening of your admission symptoms, develop shortness of breath, life threatening emergency, suicidal or homicidal thoughts you must seek medical attention immediately by calling 911 or calling your MD immediately  if symptoms less severe. ? ?You Must read complete instructions/literature along with all the possible adverse reactions/side effects for all the Medicines you take and that have been prescribed to you. Take any new Medicines after you have completely understood and accpet all the possible adverse reactions/side effects.  ? Increase activity slowly   Complete by: As directed ?  ? ?  ? ? ?Discharge Medications  ? ?Allergies as of 07/21/2021   ?No Known Allergies ?  ? ?  ?Medication List  ?  ? ?TAKE these medications   ? ?alendronate 70 MG tablet ?Commonly known as: FOSAMAX ?Take 70 mg by mouth once a week. ?  ?cephALEXin 500 MG capsule ?Commonly known as: KEFLEX ?Take 1 capsule (500 mg total) by mouth 3 (three) times daily for 3 days. ?  ?Vitamin D (Ergocalciferol) 1.25 MG (50000 UNIT) Caps capsule ?Commonly known as: DRISDOL ?Take 1 capsule (50,000 Units total) by mouth every 7 (seven) days. ?  ? ?  ? ?  ?  ? ? ?  ?Durable Medical Equipment  ?(From admission, onward)  ?  ? ? ?  ? ?  Start     Ordered  ? 07/21/21 0828  For home use only DME Walker rolling  Once       ?Comments: 5 wheel  ?Question Answer Comment  ?Walker: With 5 Inch Wheels   ?Patient  needs a walker to treat with the following condition Weakness   ?  ? 07/21/21 0827  ? ?  ?  ? ?  ? ? ?  Follow-up Information   ? ? [facility].. Schedule an appointment as soon as possible for a visit in 1 week(s).   ?Why: please get outpatient thyroid ultrasound and echocardiogram done. ?Contact information: ?[address] ?[place] [address] ?[phone] ? ? ?  ?  ? ?  ?  ? ?  ? ? ?Major procedures and Radiology Reports - PLEASE review detailed and final reports thoroughly  -    ? ?  ?DG Knee 2 Views Left ? ?Result Date: 07/20/2021 ?CLINICAL DATA:  Altered mental status.  Bruising around the knee. EXAM: LEFT KNEE - 1-2 VIEW COMPARISON:  None Available. FINDINGS: No evidence for an acute fracture. No dislocation. There may be a tiny joint effusion. Degenerative spurring is noted in all 3 compartments. Bones are demineralized. No worrisome lytic or sclerotic osseous abnormality. IMPRESSION: 1. No acute bony abnormality. 2. Tricompartmental degenerative changes with probable tiny joint effusion. Electronically Signed   By: Misty Stanley M.D.   On: 07/20/2021 13:20  ? ?CT HEAD WO CONTRAST ? ?Result Date: 07/20/2021 ?CLINICAL DATA:  Neuro deficit, acute, stroke suspected; Neck trauma (Age >= 65y). Altered mental status. Possible syncopal episode. EXAM: CT HEAD WITHOUT CONTRAST CT CERVICAL SPINE WITHOUT CONTRAST TECHNIQUE: Multidetector CT imaging of the head and cervical spine was performed following the standard protocol without intravenous contrast. Multiplanar CT image reconstructions of the cervical spine were also generated. RADIATION DOSE REDUCTION: This exam was performed according to the departmental dose-optimization program which includes automated exposure control, adjustment of the mA and/or kV according to patient size and/or use of iterative reconstruction technique. COMPARISON:  Head CT 12/14/2017 FINDINGS: CT HEAD FINDINGS Brain: There is no evidence of an acute infarct,  intracranial hemorrhage, mass, midline shift, or extra-axial fluid collection. Patchy to confluent hypodensities in the cerebral white matter bilaterally may have mildly progressed and are nonspecific but compatib

## 2021-07-21 NOTE — Plan of Care (Signed)

## 2021-07-23 LAB — GLUCOSE, CAPILLARY: Glucose-Capillary: 101 mg/dL — ABNORMAL HIGH (ref 70–99)

## 2021-08-19 ENCOUNTER — Emergency Department (HOSPITAL_COMMUNITY): Payer: Medicaid Other

## 2021-08-19 ENCOUNTER — Other Ambulatory Visit: Payer: Self-pay

## 2021-08-19 ENCOUNTER — Inpatient Hospital Stay (HOSPITAL_COMMUNITY)
Admission: EM | Admit: 2021-08-19 | Discharge: 2021-08-22 | DRG: 641 | Disposition: A | Discharge status: Home / Self Care | Payer: Medicaid Other | Attending: Internal Medicine | Admitting: Internal Medicine

## 2021-08-19 ENCOUNTER — Observation Stay (HOSPITAL_COMMUNITY): Payer: Medicaid Other

## 2021-08-19 DIAGNOSIS — Z7983 Long term (current) use of bisphosphonates: Secondary | ICD-10-CM

## 2021-08-19 DIAGNOSIS — Z90710 Acquired absence of both cervix and uterus: Secondary | ICD-10-CM

## 2021-08-19 DIAGNOSIS — E871 Hypo-osmolality and hyponatremia: Secondary | ICD-10-CM | POA: Diagnosis present

## 2021-08-19 DIAGNOSIS — R627 Adult failure to thrive: Secondary | ICD-10-CM | POA: Diagnosis present

## 2021-08-19 DIAGNOSIS — N179 Acute kidney failure, unspecified: Secondary | ICD-10-CM

## 2021-08-19 DIAGNOSIS — E860 Dehydration: Principal | ICD-10-CM | POA: Diagnosis present

## 2021-08-19 DIAGNOSIS — F03C0 Unspecified dementia, severe, without behavioral disturbance, psychotic disturbance, mood disturbance, and anxiety: Secondary | ICD-10-CM | POA: Diagnosis not present

## 2021-08-19 DIAGNOSIS — R55 Syncope and collapse: Secondary | ICD-10-CM

## 2021-08-19 DIAGNOSIS — R7303 Prediabetes: Secondary | ICD-10-CM | POA: Diagnosis present

## 2021-08-19 DIAGNOSIS — I951 Orthostatic hypotension: Secondary | ICD-10-CM | POA: Diagnosis present

## 2021-08-19 DIAGNOSIS — Z833 Family history of diabetes mellitus: Secondary | ICD-10-CM

## 2021-08-19 DIAGNOSIS — R778 Other specified abnormalities of plasma proteins: Secondary | ICD-10-CM | POA: Diagnosis present

## 2021-08-19 DIAGNOSIS — I1310 Hypertensive heart and chronic kidney disease without heart failure, with stage 1 through stage 4 chronic kidney disease, or unspecified chronic kidney disease: Secondary | ICD-10-CM | POA: Diagnosis present

## 2021-08-19 DIAGNOSIS — N1832 Chronic kidney disease, stage 3b: Secondary | ICD-10-CM | POA: Diagnosis present

## 2021-08-19 DIAGNOSIS — Z8541 Personal history of malignant neoplasm of cervix uteri: Secondary | ICD-10-CM

## 2021-08-19 DIAGNOSIS — R739 Hyperglycemia, unspecified: Secondary | ICD-10-CM | POA: Diagnosis present

## 2021-08-19 DIAGNOSIS — I739 Peripheral vascular disease, unspecified: Secondary | ICD-10-CM | POA: Diagnosis present

## 2021-08-19 DIAGNOSIS — E041 Nontoxic single thyroid nodule: Secondary | ICD-10-CM | POA: Diagnosis present

## 2021-08-19 DIAGNOSIS — I248 Other forms of acute ischemic heart disease: Secondary | ICD-10-CM | POA: Diagnosis present

## 2021-08-19 DIAGNOSIS — R350 Frequency of micturition: Secondary | ICD-10-CM | POA: Diagnosis present

## 2021-08-19 DIAGNOSIS — F0390 Unspecified dementia without behavioral disturbance: Secondary | ICD-10-CM | POA: Diagnosis present

## 2021-08-19 DIAGNOSIS — L22 Diaper dermatitis: Secondary | ICD-10-CM | POA: Diagnosis present

## 2021-08-19 DIAGNOSIS — N189 Chronic kidney disease, unspecified: Secondary | ICD-10-CM

## 2021-08-19 DIAGNOSIS — R7989 Other specified abnormal findings of blood chemistry: Secondary | ICD-10-CM | POA: Diagnosis present

## 2021-08-19 LAB — LACTIC ACID, PLASMA: Lactic Acid, Venous: 1.6 mmol/L (ref 0.5–1.9)

## 2021-08-19 LAB — URINALYSIS, ROUTINE W REFLEX MICROSCOPIC
Bilirubin Urine: NEGATIVE
Glucose, UA: NEGATIVE mg/dL
Hgb urine dipstick: NEGATIVE
Ketones, ur: NEGATIVE mg/dL
Nitrite: NEGATIVE
Protein, ur: NEGATIVE mg/dL
Specific Gravity, Urine: 1.018 (ref 1.005–1.030)
pH: 5 (ref 5.0–8.0)

## 2021-08-19 LAB — CBC
HCT: 42.4 % (ref 36.0–46.0)
Hemoglobin: 14.8 g/dL (ref 12.0–15.0)
MCH: 32.8 pg (ref 26.0–34.0)
MCHC: 34.9 g/dL (ref 30.0–36.0)
MCV: 94 fL (ref 80.0–100.0)
Platelets: 258 10*3/uL (ref 150–400)
RBC: 4.51 MIL/uL (ref 3.87–5.11)
RDW: 12 % (ref 11.5–15.5)
WBC: 7.5 10*3/uL (ref 4.0–10.5)
nRBC: 0 % (ref 0.0–0.2)

## 2021-08-19 LAB — D-DIMER, QUANTITATIVE: D-Dimer, Quant: 0.63 ug/mL-FEU — ABNORMAL HIGH (ref 0.00–0.50)

## 2021-08-19 LAB — BASIC METABOLIC PANEL
Anion gap: 11 (ref 5–15)
BUN: 52 mg/dL — ABNORMAL HIGH (ref 8–23)
CO2: 21 mmol/L — ABNORMAL LOW (ref 22–32)
Calcium: 10 mg/dL (ref 8.9–10.3)
Chloride: 102 mmol/L (ref 98–111)
Creatinine, Ser: 2.02 mg/dL — ABNORMAL HIGH (ref 0.44–1.00)
GFR, Estimated: 24 mL/min — ABNORMAL LOW (ref >60)
Glucose, Bld: 196 mg/dL — ABNORMAL HIGH (ref 70–99)
Potassium: 5.1 mmol/L (ref 3.5–5.1)
Sodium: 134 mmol/L — ABNORMAL LOW (ref 135–145)

## 2021-08-19 LAB — TROPONIN I (HIGH SENSITIVITY)
Troponin I (High Sensitivity): 25 ng/L — ABNORMAL HIGH (ref <18)
Troponin I (High Sensitivity): 24 ng/L — ABNORMAL HIGH (ref <18)

## 2021-08-19 LAB — CBG MONITORING, ED: Glucose-Capillary: 123 mg/dL — ABNORMAL HIGH (ref 70–99)

## 2021-08-19 MED ORDER — ALBUTEROL SULFATE (2.5 MG/3ML) 0.083% IN NEBU: 2.5000 mg | INHALATION_SOLUTION | Freq: Four times a day (QID) | RESPIRATORY_TRACT | Status: DC | PRN

## 2021-08-19 MED ORDER — SODIUM CHLORIDE 0.9 % IV SOLN
1.0000 g | INTRAVENOUS | Status: DC
  Administered 2021-08-19 – 2021-08-20 (×2): 1 g via INTRAVENOUS
  Filled 2021-08-19 (×2): qty 10

## 2021-08-19 MED ORDER — ACETAMINOPHEN 325 MG PO TABS: 650.0000 mg | ORAL_TABLET | Freq: Four times a day (QID) | ORAL | Status: DC | PRN

## 2021-08-19 MED ORDER — HEPARIN SODIUM (PORCINE) 5000 UNIT/ML IJ SOLN
5000.0000 [IU] | Freq: Three times a day (TID) | INTRAMUSCULAR | Status: DC
Start: 2021-08-19 — End: 2021-08-22
  Administered 2021-08-19 – 2021-08-22 (×9): 5000 [IU] via SUBCUTANEOUS
  Filled 2021-08-19 (×8): qty 1

## 2021-08-19 MED ORDER — SODIUM CHLORIDE 0.9 % IV BOLUS
1000.0000 mL | Freq: Once | INTRAVENOUS | Status: AC
  Administered 2021-08-19: 1000 mL via INTRAVENOUS

## 2021-08-19 MED ORDER — SODIUM CHLORIDE 0.9 % IV SOLN: INTRAVENOUS | Status: DC

## 2021-08-19 MED ORDER — ACETAMINOPHEN 650 MG RE SUPP: 650.0000 mg | Freq: Four times a day (QID) | RECTAL | Status: DC | PRN

## 2021-08-19 MED ORDER — ENSURE ENLIVE PO LIQD
237.0000 mL | Freq: Four times a day (QID) | ORAL | Status: DC
  Administered 2021-08-20 – 2021-08-22 (×7): 237 mL via ORAL
  Filled 2021-08-19: qty 237

## 2021-08-19 MED ORDER — SODIUM CHLORIDE 0.9% FLUSH
3.0000 mL | Freq: Two times a day (BID) | INTRAVENOUS | Status: DC
  Administered 2021-08-19 – 2021-08-22 (×6): 3 mL via INTRAVENOUS

## 2021-08-19 NOTE — H&P (Addendum)
History and Physical    Patient: Kara Dennis CVE:[id number] DOB: 1940-09-07 DOA: 08/19/2021 DOS: the patient was seen and examined on 08/19/2021 PCP: [facility].  Patient coming from: Home via EMS  Chief Complaint:  Chief Complaint  Patient presents with   Loss of Consciousness   HPI: Kara Dennis is a 81 y.o. female with medical history significant of advanced dementia followed by Dr. Jaynee Eagles of neurology, HTN, and history of cervical cancer s/p hysterectomy who presents after having a reported syncopal event.  History is obtained from the patient's daughter and daughter-in-law with the use of interpreter services.  At baseline patient needs assistance to perform all ADLs and does not speak very much.  Previous records note that she is nonverbal.  The patient's daughter-in-law has been walking her to the bathroom this morning when she had taken about 2 steps in her eyes open wide prior to the patient losing consciousness for approximately 2 minutes.  Family went and put "heat oil" on her head and she woke up. Patient had been hospitalized 5/12-5/13 after having syncopal event and was found to have AKI.  She had CT of the head and neck that gave no signs of stroke with extensive chronic small vessel disease and multiple thyroid nodules measuring up to 1.5 cm.  Patient was given IV fluids for hydration, and underwent EEG that did not show any significant seizure-like activity.  She was supposed to follow-up and have outpatient echocardiogram and possibly obtain 30-day event monitor.  It appears that she was scheduled to have a thyroid ultrasound for nodules seen on CT with her primary care provider, but the other tests were not ordered.  Since getting home the patient's family notes that she has had a dusky appearance of her feet, has had swelling of her legs, and had not been ambulating as much.  They report that she has been drinking lots of fluids, but also peeing frequently and has a  diaper rash.  Patient drinks about 4 Ensure shakes per day. The patient's daughter relates this to her having a diaper rash, and states that she plans on transitioning her back to [place] once it is cleared up.  She has not had any significant fever, cough, nausea, vomiting, or diarrhea symptoms per family.  Upon admission into the emergency department patient was noted to be afebrile, pulse 78-109, and all other vital signs maintained.  Labs significant for sodium 134, BUN 52, creatinine 2.02, glucose 196, D-dimer 0.63, lactic acid 1.6, and high-sensitivity troponin 25.  Urinalysis noted trace leukocytes, rare bacteria, and 11-20 WBCs.  Chest x-ray noted hyperinflation without acute abnormality.  Patient had been ordered 1 L bolus and normal saline at 125 mL/h.  The ED provider had ordered ABI w/TBI.  Review of Systems: unable to review all systems due to the inability of the patient to answer questions. Past Medical History:  Diagnosis Date   Bradycardia 2014    identified before hysterectomy, treated for short time with medication before during and after hysterectomy    Cervical cancer ([place]) 2014    s/p hysterectomy, no chemo or radiation    Dementia ([place])    HTN (hypertension) 2013   took some medicine in [place]    Past Surgical History:  Procedure Laterality Date   ABDOMINAL HYSTERECTOMY  05/2012    Social History:  reports that she has never smoked. She has never used smokeless tobacco. She reports that she does not drink alcohol and does  not use drugs.  No Known Allergies  Family History  Problem Relation Age of Onset   Diabetes Brother    Heart disease Neg Hx    Cancer Neg Hx    Dementia Neg Hx    Breast cancer Neg Hx     Prior to Admission medications   Medication Sig Start Date End Date Taking? Authorizing Provider  alendronate (FOSAMAX) 70 MG tablet Take 70 mg by mouth once a week. 05/04/21   [provider]  Vitamin D, Ergocalciferol, (DRISDOL) 1.25 MG  (50000 UT) CAPS capsule Take 1 capsule (50,000 Units total) by mouth every 7 (seven) days. Patient not taking: Reported on 07/20/2021 11/24/18   Antony Blackbird, MD    Physical Exam: Vitals:   08/19/21 1315 08/19/21 1330 08/19/21 1345 08/19/21 1450  BP: 121/72 133/86 98/75 (!) 144/87  Pulse: 90 82 78 94  Resp: '14 15 13 16  '$ Temp:      TempSrc:      SpO2: 99% 100% 99% 100%    Constitutional: Elderly female currently in no acute distress, but does not speak Eyes: PERRL, lids and conjunctivae normal ENMT: Mucous membranes are dry.   .  Neck: normal, supple, no masses, no thyromegaly Respiratory: clear to auscultation bilaterally, no wheezing, no crackles. Normal respiratory effort.   Cardiovascular: Regular rate and rhythm.Trace lower extremity edema with decreased pedal pulses. Abdomen: no tenderness, no masses palpated.  Bowel sounds positive.  Musculoskeletal: Cyanosis noted of the distal aspect of the bilateral feet.  No joint deformity appreciated. Skin: Poor skin turgor. Neurologic: CN 2-12 grossly intact.  Patient able to move all 4 extremities. Psychiatric: Alert.  Unable to assess for orientation as patient is seemingly nonverbal. Data Reviewed:  Normal sinus rhythm at 95 bpm with signs of LVH  Assessment and Plan: Syncope Patient presents after having witnessed syncopal episode while family was getting her up to use the bathroom.  Patient was reportedly unresponsive for 2 minutes prior to coming to being back to baseline.  She had just been hospitalized last month for the same and had work-up included negative CT head, CTA, and EEG.  After IV hydration patient seemed to be back to her baseline.  It was recommended that she have a outpatient echocardiogram and possibly 30-day event monitor at that time.  Based off patient's acute kidney injury suspect patient's syncopal episode is secondary to orthostatic hypotension in the setting of dehydration.  Her D-dimer was noted to be slightly  elevated, but negative when adjusted for age. 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  Patient's creatinine previously been 1.23 on 5/13 when last discharge.  The elevated BUN to creatinine ratio suggest prerenal cause of symptoms.  She was given 1 L normal saline fluids and then placed at a rate of 125 mL/h. -Continue normal saline IV fluids at 75 ml/hr -Recheck kidney function tomorrow morning  Elevated troponin Acute.  High-sensitivity troponin was elevated at 25->24.  EKG noted normal sinus rhythm without significant ischemic changes. Patient does not appear to be in any distress.  Suspect possibly secondary to demand in the setting of patient's kidney function. -Follow-up echocardiogram  Abnormal urinalysis Family reports that the patient has had urinary frequency.  Urinalysis noted trace leukocytes, rare bacteria, and 11-20 WBCs. -Check culture -  Rocephin IV  Possible peripheral arterial disease Patient was noted to have decreased pulses in the lower extremities with concern for cyanosis. -Follow-up ABI w TBI  Advanced dementia Patient has fairly advanced dementia and seems to be mostly nonverbal.  Currently, only drinking Ensure shakes.  Attempted to have conversations with the family in regards to patient's condition and discuss progression of dementia, but unclear what was understood given the language barrier in spite of using interpreter services. -Likely warrants palliative care consult  Hyperglycemia prediabetes Patient presents with glucose of 196, but repeat check was 123.  Last available hemoglobin A1c was 5.9 from 11/2018. -Add on hemoglobin A1c -Continue to monitor and consider need of sliding scale insulin if blood  sugars appear to be consistently greater than 180  Hyponatremia Acute.  Sodium 134.  Suspect hypovolemic hyponatremia atrium and given patient's acute kidney injury.  Patient has been given IV fluids. -Recheck sodium levels tomorrow  Thyroid nodules Noted during previous CT of the neck from last month.  TSH 0.893 on 07/20/2021.  Patient's primary care provider has ordered a thyroid ultrasound to be obtained in the outpatient setting.  Advance Care Planning:   Code Status: Full Code   Consults: none  Family Communication: Daughter and daughter-in-law updated at bedside  Severity of Illness: The appropriate patient status for this patient is OBSERVATION. Observation status is judged to be reasonable and necessary in order to provide the required intensity of service to ensure the patient's safety. The patient's presenting symptoms, physical exam findings, and initial radiographic and laboratory data in the context of their medical condition is felt to place them at decreased risk for further clinical deterioration. Furthermore, it is anticipated that the patient will be medically stable for discharge from the hospital within 2 midnights of admission.   Author: Norval Morton, MD 08/19/2021 3:13 PM  For on call review [email or web address].

## 2021-08-19 NOTE — ED Triage Notes (Addendum)
EMS stated, she had a syncope episode. Family was helping her to bathroom and she just collapsed on the floor. Pt non verbal for a year half due to dementia. 24g in left hand

## 2021-08-19 NOTE — ED Provider Notes (Signed)
[facility] EMERGENCY DEPARTMENT Provider Note   CSN: [id number] Arrival date & time: 08/19/21  2952     History  Chief Complaint  Patient presents with   Loss of Consciousness    Carmelia Phyllicia Dudek is a 81 y.o. female.  HPI  81 y.o. female with medical history significant of dementia, HTN, cervical cancer s/p hysterectomy who presents to the emergency department after a syncopal episode.  Feel members through the aid of a [place] language telemetry interpreter states that the patient had been having difficulty ambulating today, some complaint of pain in her bilateral lower extremities with dusky extremities and color change.  Feel members were assisting her to the bathroom but she was having difficulty walking and subsequently collapsed on the floor.  She has been nonverbal for the past year and a half due to her dementia.  She was unresponsive/minimally responsive for around 2 minutes and subsequently came to and was immediately talking again with no postictal state.  Some generalized shaking while she was unresponsive, no urinary incontinence, no tongue biting.  No fevers.  The patient is now back to her normal baseline mental status. Family state that the patient has had increasing difficulty with ambulation recently but deny any focal neuro deficits.  Home Medications Prior to Admission medications   Medication Sig Start Date End Date Taking? Authorizing Provider  alendronate (FOSAMAX) 70 MG tablet Take 70 mg by mouth once a week. 05/04/21   [provider]  Vitamin D, Ergocalciferol, (DRISDOL) 1.25 MG (50000 UT) CAPS capsule Take 1 capsule (50,000 Units total) by mouth every 7 (seven) days. Patient not taking: Reported on 07/20/2021 11/24/18   Antony Blackbird, MD      Allergies    Patient has no known allergies.    Review of Systems   Review of Systems  Unable to perform ROS: Dementia  Neurological:  Positive for syncope.    Physical Exam Updated Vital  Signs BP 115/62   Pulse 89   Temp 98.6 F (37 C) (Rectal)   Resp 15   SpO2 100%  Physical Exam Vitals and nursing note reviewed.  Constitutional:      General: She is not in acute distress.    Appearance: She is well-developed.  HENT:     Head: Normocephalic and atraumatic.     Mouth/Throat:     Mouth: Mucous membranes are dry.  Eyes:     Conjunctiva/sclera: Conjunctivae normal.  Cardiovascular:     Rate and Rhythm: Normal rate and regular rhythm.     Pulses: Normal pulses.     Heart sounds: No murmur heard. Pulmonary:     Effort: Pulmonary effort is normal. No respiratory distress.     Breath sounds: Normal breath sounds.  Abdominal:     Palpations: Abdomen is soft.     Tenderness: There is no abdominal tenderness.  Musculoskeletal:        General: No swelling.     Cervical back: Neck supple.  Skin:    General: Skin is warm and dry.     Capillary Refill: Capillary refill takes 2 to 3 seconds.     Comments: Dusky extremities bilaterally, prolonged capillary refill noted  Neurological:     Mental Status: She is alert.     Comments: Nonverbal at baseline, dementia, GCS 14, no clear cranial nerve deficit, 5 out of 5 strength in the bilateral upper extremities, difficult to fully get the patient to perform strength testing in the bilateral lower extremities,  moving bilateral lower extremities spontaneously antigravity  Psychiatric:        Mood and Affect: Mood normal.     ED Results / Procedures / Treatments   Labs (all labs ordered are listed, but only abnormal results are displayed) Labs Reviewed  BASIC METABOLIC PANEL - Abnormal; Notable for the following components:      Result Value   Sodium 134 (*)    CO2 21 (*)    Glucose, Bld 196 (*)    BUN 52 (*)    Creatinine, Ser 2.02 (*)    GFR, Estimated 24 (*)    All other components within normal limits  URINALYSIS, ROUTINE W REFLEX MICROSCOPIC - Abnormal; Notable for the following components:   APPearance HAZY (*)     Leukocytes,Ua TRACE (*)    Bacteria, UA RARE (*)    All other components within normal limits  D-DIMER, QUANTITATIVE - Abnormal; Notable for the following components:   D-Dimer, Quant 0.63 (*)    All other components within normal limits  CBG MONITORING, ED - Abnormal; Notable for the following components:   Glucose-Capillary 123 (*)    All other components within normal limits  TROPONIN I (HIGH SENSITIVITY) - Abnormal; Notable for the following components:   Troponin I (High Sensitivity) 25 (*)    All other components within normal limits  CBC  LACTIC ACID, PLASMA  TROPONIN I (HIGH SENSITIVITY)    EKG EKG Interpretation  Date/Time:  'Sunday August 19 2021 12:47:11 EDT Ventricular Rate:  99 PR Interval:  190 QRS Duration: 77 QT Interval:  366 QTC Calculation: 470 R Axis:   34 Text Interpretation: Sinus rhythm Confirmed by ,  ([id number]) on 08/19/2021 1:15:59 PM  Radiology DG Chest Portable 1 View  Result Date: 08/19/2021 CLINICAL DATA:  Syncope. EXAM: PORTABLE CHEST 1 VIEW COMPARISON:  07/20/2021 FINDINGS: Lungs are hyperexpanded. The lungs are clear without focal pneumonia, edema, pneumothorax or pleural effusion. Cardiopericardial silhouette is at upper limits of normal for size. Telemetry leads overlie the chest. IMPRESSION: Hyperexpansion without acute cardiopulmonary findings. Electronically Signed   By: Eric  Mansell M.D.   On: 08/19/2021 13:47    Procedures Procedures    Medications Ordered in ED Medications  sodium chloride 0.9 % bolus 1,000 mL (1,000 mLs Intravenous New Bag/Given 08/19/21 1258)    And  0.9 %  sodium chloride infusion (has no administration in time range)  heparin injection 5,000 Units (has no administration in time range)  sodium chloride flush (NS) 0.9 % injection 3 mL (has no administration in time range)  acetaminophen (TYLENOL) tablet 650 mg (has no administration in time range)    Or  acetaminophen (TYLENOL) suppository 650 mg (has no  administration in time range)  albuterol (PROVENTIL) (2.5 MG/3ML) 0.083% nebulizer solution 2.5 mg (has no administration in time range)    ED Course/ Medical Decision Making/ A&P Clinical Course as of 08/19/21 1547  Sun Aug 19, 2021  1242 Creatinine(!): 2.02 [JL]  1242 BUN(!): 52 [JL]    Clinical Course User Index [JL] , , MD                           Medical Decision Making Amount and/or Complexity of Data Reviewed Labs: ordered. Decision-making details documented in ED Course. Radiology: ordered. ECG/medicine tests: ordered.  Risk Prescription drug management. Decision regarding hospitalization.     81'$  y.o. female with medical history significant of dementia, HTN, cervical cancer s/p hysterectomy who  presents to the emergency department after a syncopal episode.  Feel members through the aid of a [place] language telemetry interpreter states that the patient had been having difficulty ambulating today, some complaint of pain in her bilateral lower extremities with dusky extremities and color change.  Feel members were assisting her to the bathroom but she was having difficulty walking and subsequently collapsed on the floor.  She has been nonverbal for the past year and a half due to her dementia.  She was unresponsive/minimally responsive for around 2 minutes and subsequently came to and was immediately talking again with no postictal state.  Some generalized shaking while she was unresponsive, no urinary incontinence, no tongue biting.  No fevers.  The patient is now back to her normal baseline mental status. Family state that the patient has had increasing difficulty with ambulation recently but deny any focal neuro deficits.  On arrival, the patient was vitally stable, mildly tachycardic P101, not tachypneic, normotensive, saturating 96% on room air.  The patient was afebrile.  Physical exam significant for slightly dry mucous membranes, normal neurologic exam,  limited by the patient's dementia but no clear focal deficit noted. No pulse deficit. Low suspicion for aortic dissection. No pulsatile mass and abdomen is non-tender. Low suspicion for ruptured AAA.  Concern for dehydration and hypovolemia resulting in a syncopal episode.  CBG on arrival 123, D-dimer negative after adjustment for age at 13.63, BMP with evidence of an AKI with a creatinine of 2.02 and a BUN of 52, urinalysis without clear evidence of UTI with trace leukocytes and rare bacteria (pt without symptoms and currently at baseline level of dementia), initial troponin elevated 25, repeat troponin pending.  Lactic acid was normal at 1.6.  Low concern for PE as the patient's dimer was normal after age adjustment.   Family members expressed concern as the patient's extremities were dusky bilaterally.  She does have diminished capillary refill and dusky extremities but does have intact 2+ DP pulses bilaterally.  Given her difficulty ambulating, consideration was given to limb claudication as the etiology of some of the patient's recent decline in ambulation.  ABIs were ordered.  Chest x-ray was performed, reviewed by myself and radiology negative for any focal abnormality.  The patient was administered an IV fluid bolus for volume resuscitation in the setting of an AKI.  Given the patient's AKI and syncopal episode, hospitalist medicine was consulted for admission.  Dr. Tamala Julian of hospitalist medicine accepted the patient in admission for continued monitoring on cardiac telemetry, trending of patient's troponin, volume resuscitation in the setting of AKI.    Final Clinical Impression(s) / ED Diagnoses Final diagnoses:  AKI (acute kidney injury) ([place])  Syncope, unspecified syncope type  Elevated troponin    Rx / DC Orders ED Discharge Orders     None         Regan Lemming, MD 08/19/21 1548

## 2021-08-19 NOTE — ED Notes (Signed)
Dr. Smith at bedside.

## 2021-08-19 NOTE — Progress Notes (Signed)
VASCULAR LAB    ABI has been performed.  See CV proc for preliminary results.   Letica Giaimo, RVT 08/19/2021, 3:59 PM

## 2021-08-19 NOTE — ED Notes (Signed)
Pt has dementia unable to collect at

## 2021-08-20 ENCOUNTER — Observation Stay (HOSPITAL_COMMUNITY): Payer: Medicaid Other

## 2021-08-20 DIAGNOSIS — F0390 Unspecified dementia without behavioral disturbance: Secondary | ICD-10-CM | POA: Diagnosis present

## 2021-08-20 DIAGNOSIS — I739 Peripheral vascular disease, unspecified: Secondary | ICD-10-CM | POA: Diagnosis present

## 2021-08-20 DIAGNOSIS — R7989 Other specified abnormal findings of blood chemistry: Secondary | ICD-10-CM | POA: Diagnosis not present

## 2021-08-20 DIAGNOSIS — E860 Dehydration: Secondary | ICD-10-CM | POA: Diagnosis present

## 2021-08-20 DIAGNOSIS — Z7983 Long term (current) use of bisphosphonates: Secondary | ICD-10-CM | POA: Diagnosis not present

## 2021-08-20 DIAGNOSIS — R7303 Prediabetes: Secondary | ICD-10-CM | POA: Diagnosis present

## 2021-08-20 DIAGNOSIS — N1832 Chronic kidney disease, stage 3b: Secondary | ICD-10-CM | POA: Diagnosis present

## 2021-08-20 DIAGNOSIS — Z90710 Acquired absence of both cervix and uterus: Secondary | ICD-10-CM | POA: Diagnosis not present

## 2021-08-20 DIAGNOSIS — E871 Hypo-osmolality and hyponatremia: Secondary | ICD-10-CM | POA: Diagnosis present

## 2021-08-20 DIAGNOSIS — R739 Hyperglycemia, unspecified: Secondary | ICD-10-CM | POA: Diagnosis present

## 2021-08-20 DIAGNOSIS — Z8541 Personal history of malignant neoplasm of cervix uteri: Secondary | ICD-10-CM | POA: Diagnosis not present

## 2021-08-20 DIAGNOSIS — Z7189 Other specified counseling: Secondary | ICD-10-CM

## 2021-08-20 DIAGNOSIS — N179 Acute kidney failure, unspecified: Secondary | ICD-10-CM | POA: Diagnosis present

## 2021-08-20 DIAGNOSIS — R627 Adult failure to thrive: Secondary | ICD-10-CM | POA: Diagnosis present

## 2021-08-20 DIAGNOSIS — F03C0 Unspecified dementia, severe, without behavioral disturbance, psychotic disturbance, mood disturbance, and anxiety: Secondary | ICD-10-CM | POA: Diagnosis not present

## 2021-08-20 DIAGNOSIS — I248 Other forms of acute ischemic heart disease: Secondary | ICD-10-CM | POA: Diagnosis present

## 2021-08-20 DIAGNOSIS — I951 Orthostatic hypotension: Secondary | ICD-10-CM | POA: Diagnosis present

## 2021-08-20 DIAGNOSIS — R55 Syncope and collapse: Secondary | ICD-10-CM | POA: Diagnosis not present

## 2021-08-20 DIAGNOSIS — E041 Nontoxic single thyroid nodule: Secondary | ICD-10-CM | POA: Diagnosis present

## 2021-08-20 DIAGNOSIS — R350 Frequency of micturition: Secondary | ICD-10-CM | POA: Diagnosis present

## 2021-08-20 DIAGNOSIS — I1310 Hypertensive heart and chronic kidney disease without heart failure, with stage 1 through stage 4 chronic kidney disease, or unspecified chronic kidney disease: Secondary | ICD-10-CM | POA: Diagnosis present

## 2021-08-20 DIAGNOSIS — Z833 Family history of diabetes mellitus: Secondary | ICD-10-CM | POA: Diagnosis not present

## 2021-08-20 DIAGNOSIS — L22 Diaper dermatitis: Secondary | ICD-10-CM | POA: Diagnosis present

## 2021-08-20 LAB — ECHOCARDIOGRAM COMPLETE
AR max vel: 1.77 cm2
AV Area VTI: 1.32 cm2
AV Area mean vel: 2.07 cm2
AV Mean grad: 14 mmHg
AV Peak grad: 29.2 mmHg
Ao pk vel: 2.7 m/s
Area-P 1/2: 19.96 cm2
MV M vel: 4.71 m/s
MV Peak grad: 88.7 mmHg
P 1/2 time: 413 msec
S' Lateral: 2.4 cm

## 2021-08-20 LAB — CBC
HCT: 34.2 % — ABNORMAL LOW (ref 36.0–46.0)
Hemoglobin: 11.8 g/dL — ABNORMAL LOW (ref 12.0–15.0)
MCH: 32.5 pg (ref 26.0–34.0)
MCHC: 34.5 g/dL (ref 30.0–36.0)
MCV: 94.2 fL (ref 80.0–100.0)
Platelets: 191 10*3/uL (ref 150–400)
RBC: 3.63 MIL/uL — ABNORMAL LOW (ref 3.87–5.11)
RDW: 12 % (ref 11.5–15.5)
WBC: 4.9 10*3/uL (ref 4.0–10.5)
nRBC: 0 % (ref 0.0–0.2)

## 2021-08-20 LAB — BASIC METABOLIC PANEL
Anion gap: 8 (ref 5–15)
BUN: 36 mg/dL — ABNORMAL HIGH (ref 8–23)
CO2: 17 mmol/L — ABNORMAL LOW (ref 22–32)
Calcium: 8.8 mg/dL — ABNORMAL LOW (ref 8.9–10.3)
Chloride: 113 mmol/L — ABNORMAL HIGH (ref 98–111)
Creatinine, Ser: 1.58 mg/dL — ABNORMAL HIGH (ref 0.44–1.00)
GFR, Estimated: 33 mL/min — ABNORMAL LOW (ref >60)
Glucose, Bld: 98 mg/dL (ref 70–99)
Potassium: 4.8 mmol/L (ref 3.5–5.1)
Sodium: 138 mmol/L (ref 135–145)

## 2021-08-20 NOTE — Progress Notes (Signed)
Echocardiogram 2D Echocardiogram has been performed.  Joette Catching 08/20/2021, 9:33 AM

## 2021-08-20 NOTE — ED Notes (Signed)
Palliative did bedside consult.

## 2021-08-20 NOTE — Progress Notes (Signed)
PROGRESS NOTE    [name]  OHY:[id number] DOB: 1940/10/20 DOA: 08/19/2021 PCP: [place].    Brief Narrative:  Kara Dennis is a 81 y.o. female with medical history significant of advanced dementia followed by Dr. Jaynee Eagles of neurology, HTN, and history of cervical cancer s/p hysterectomy who presents after having a reported syncopal event.  History is obtained from the patient's daughter and daughter-in-law with the use of interpreter services.  At baseline patient needs assistance to perform all ADLs and does not speak very much.  Previous records note that she is nonverbal.  The patient's daughter-in-law has been walking her to the bathroom this morning when she had taken about 2 steps in her eyes open wide prior to the patient losing consciousness for approximately 2 minutes.  Family went and put "heat oil" on her head and she woke up. Patient had been hospitalized 5/12-5/13 after having syncopal event and was found to have AKI.  She had CT of the head and neck that gave no signs of stroke with extensive chronic small vessel disease and multiple thyroid nodules measuring up to 1.5 cm.  Patient was given IV fluids for hydration, and underwent EEG that did not show any significant seizure-like activity.  She was supposed to follow-up and have outpatient echocardiogram and possibly obtain 30-day event monitor.   Assessment and Plan:  Syncope Patient presents after having witnessed syncopal episode while family was getting her up to use the bathroom.  Patient was reportedly unresponsive for 2 minutes prior to coming to being back to baseline.  She had just been hospitalized last month for the same and had work-up included negative CT head, CTA, and EEG.  After IV hydration patient seemed to be back to her baseline.  It was recommended that she have a outpatient echocardiogram and possibly 30-day event monitor at that time.  Based off patient's acute kidney injury suspect patient's  syncopal episode is secondary to orthostatic hypotension in the setting of dehydration.  Her D-dimer was noted to be slightly elevated, but negative when adjusted for age. -appears to be related to dehydration -palliative care consult as this will be an ongoing issue with her dementia/FTT    Acute kidney injury superimposed on chronic kidney disease stage IIIb Patient presents with creatinine of 2.02 with BUN 52.  Patient's creatinine previously been 1.23 on 5/13 when last discharge.  The elevated BUN to creatinine ratio suggest prerenal cause of symptoms.  She was given 1 L normal saline fluids and then placed at a rate of 125 mL/h. -Continue normal saline IV fluids at 75 ml/hr as still dehydrated  Elevated troponin Acute.  High-sensitivity troponin was elevated at 25->24.  EKG noted normal sinus rhythm without significant ischemic changes. Patient does not appear to be in any distress.  Suspect possibly secondary to demand in the setting of patient's kidney function. -echocardiogram: Mild LVOT outflow obstruction. Left ventricular ejection fraction, by  estimation, is 60 to 65%. The left ventricle has normal function. The left  ventricle has no regional wall motion abnormalities. There is moderate  asymmetric left ventricular  hypertrophy. Left ventricular diastolic parameters are consistent with  Grade I diastolic dysfunction (impaired relaxation). Recommend IVF   Abnormal urinalysis Family reports that the patient has had urinary frequency.  Urinalysis noted trace leukocytes, rare bacteria, and 11-20 WBCs. -Check culture -Rocephin IV   Possible peripheral arterial disease - ABI w TBI- no evidence of significant disease   Advanced dementia Patient has  fairly advanced dementia and seems to be mostly nonverbal.  Currently, only drinking Ensure shakes.  -palliative care consult   Hyperglycemia prediabetes Patient presents with glucose of 196, but repeat check was 123.  Last available  hemoglobin A1c was 5.9 from 11/2018. -hemoglobin A1c pending -Continue to monitor and consider need of sliding scale insulin if blood sugars appear to be consistently greater than 180   Hyponatremia Acute.  Sodium 134.  Suspect hypovolemic hyponatremia atrium and given patient's acute kidney injury.  Patient has been given IV fluids. -Resolved   Thyroid nodules Noted during previous CT of the neck from last month.  TSH 0.893 on 07/20/2021.  Patient's primary care provider has ordered a thyroid ultrasound to be obtained in the outpatient setting.  DVT prophylaxis: heparin injection 5,000 Units Start: 08/19/21 1530    Code Status: Full Code Family Communication: at bedside/interpreter   Disposition Plan:  Level of care: Telemetry Medical Status is: Observation The patient will require care spanning > 2 midnights and should be moved to inpatient because: needs IVF    Consultants:  Palliative care   Subjective: Eyes closed, non-verbal Family at bedside  Objective: Vitals:   08/20/21 0139 08/20/21 0551 08/20/21 0800 08/20/21 1000  BP: 115/62 (!) 156/71 126/68 128/79  Pulse: 93 85    Resp: '14 14 15 15  '$ Temp:  98.8 F (37.1 C)    TempSrc:  Oral    SpO2: 98% 100%  100%    Intake/Output Summary (Last 24 hours) at 08/20/2021 1138 Last data filed at 08/19/2021 2224 Gross per 24 hour  Intake 1103 ml  Output --  Net 1103 ml   There were no vitals filed for this visit.  Examination:   General: Appearance:    Elderly female in no acute distress, non verbal     Lungs:     respirations unlabored  Heart:    Normal heart rate.    MS:   All extremities are intact.            Data Reviewed: I have personally reviewed following labs and imaging studies  CBC: Recent Labs  Lab 08/19/21 1015 08/20/21 0615  WBC 7.5 4.9  HGB 14.8 11.8*  HCT 42.4 34.2*  MCV 94.0 94.2  PLT 258 237   Basic Metabolic Panel: Recent Labs  Lab 08/19/21 1015 08/20/21 0615  NA 134* 138  K  5.1 4.8  CL 102 113*  CO2 21* 17*  GLUCOSE 196* 98  BUN 52* 36*  CREATININE 2.02* 1.58*  CALCIUM 10.0 8.8*   GFR: CrCl cannot be calculated (Unknown ideal weight.). Liver Function Tests: No results for input(s): "AST", "ALT", "ALKPHOS", "BILITOT", "PROT", "ALBUMIN" in the last 168 hours. No results for input(s): "LIPASE", "AMYLASE" in the last 168 hours. No results for input(s): "AMMONIA" in the last 168 hours. Coagulation Profile: No results for input(s): "INR", "PROTIME" in the last 168 hours. Cardiac Enzymes: No results for input(s): "CKTOTAL", "CKMB", "CKMBINDEX", "TROPONINI" in the last 168 hours. BNP (last 3 results) No results for input(s): "PROBNP" in the last 8760 hours. HbA1C: No results for input(s): "HGBA1C" in the last 72 hours. CBG: Recent Labs  Lab 08/19/21 1303  GLUCAP 123*   Lipid Profile: No results for input(s): "CHOL", "HDL", "LDLCALC", "TRIG", "CHOLHDL", "LDLDIRECT" in the last 72 hours. Thyroid Function Tests: No results for input(s): "TSH", "T4TOTAL", "FREET4", "T3FREE", "THYROIDAB" in the last 72 hours. Anemia Panel: No results for input(s): "VITAMINB12", "FOLATE", "FERRITIN", "TIBC", "IRON", "RETICCTPCT" in the last 72  hours. Sepsis Labs: Recent Labs  Lab 08/19/21 1316  LATICACIDVEN 1.6    No results found for this or any previous visit (from the past 240 hour(s)).       Radiology Studies: ECHOCARDIOGRAM COMPLETE  Result Date: 08/20/2021    ECHOCARDIOGRAM REPORT   Patient Name:   JOHANNAH ROZAS [facility] Date of Exam: 08/20/2021 Medical Rec #:  [id number]     Height:       61.0 in Accession #:    [id number]    Weight:       110.4 lb Date of Birth:  1941/02/01      BSA:          1.468 m Patient Age:    81 years      BP:           126/68 mmHg Patient Gender: F             HR:           83 bpm. Exam Location:  Inpatient Procedure: 2D Echo, Color Doppler and Cardiac Doppler Indications:    Elevated troponin , Syncope  History:        Patient has prior history  of Echocardiogram examinations, most                 recent 12/15/2017. Risk Factors:Hypertension. Dementia.  Sonographer:    Joette Catching RCS Referring Phys: [id number] RONDELL A SMITH  Sonographer Comments: Image acquisition challenging due to uncooperative patient. IMPRESSIONS  1. Mild LVOT outflow obstruction. Left ventricular ejection fraction, by estimation, is 60 to 65%. The left ventricle has normal function. The left ventricle has no regional wall motion abnormalities. There is moderate asymmetric left ventricular hypertrophy. Left ventricular diastolic parameters are consistent with Grade I diastolic dysfunction (impaired relaxation).  2. Right ventricular systolic function is normal. The right ventricular size is normal.  3. A small pericardial effusion is present. There is no evidence of cardiac tamponade.  4. No evidence of mitral valve regurgitation.  5. There is mild calcification of the aortic valve. Aortic valve regurgitation is mild. Mild aortic valve stenosis.  6. Aortic no significant ascending aortic disease.  7. The inferior vena cava is normal in size with greater than 50% respiratory variability, suggesting right atrial pressure of 3 mmHg. Comparison(s): No prior Echocardiogram. Conclusion(s)/Recommendation(s): Recommend fluid resuscitation. FINDINGS  Left Ventricle: Mild LVOT outflow obstruction. Left ventricular ejection fraction, by estimation, is 60 to 65%. The left ventricle has normal function. The left ventricle has no regional wall motion abnormalities. The left ventricular internal cavity size was normal in size. There is moderate asymmetric left ventricular hypertrophy. Left ventricular diastolic parameters are consistent with Grade I diastolic dysfunction (impaired relaxation). Right Ventricle: The right ventricular size is normal. Right ventricular systolic function is normal. Left Atrium: Left atrial size was normal in size. Right Atrium: Right atrial size was normal in size.  Pericardium: A small pericardial effusion is present. There is no evidence of cardiac tamponade. Mitral Valve: No evidence of mitral valve regurgitation. Tricuspid Valve: Tricuspid valve regurgitation is trivial. Aortic Valve: There is mild calcification of the aortic valve. Aortic valve regurgitation is mild. Aortic regurgitation PHT measures 413 msec. Mild aortic stenosis is present. Aortic valve mean gradient measures 14.0 mmHg. Aortic valve peak gradient measures 29.2 mmHg. Aortic valve area, by VTI measures 1.32 cm. Pulmonic Valve: Pulmonic valve regurgitation is not visualized. Aorta: No significant ascending aortic disease. Venous: The inferior vena cava is normal in size with greater  than 50% respiratory variability, suggesting right atrial pressure of 3 mmHg.  LEFT VENTRICLE PLAX 2D LVIDd:         3.30 cm   Diastology LVIDs:         2.40 cm   LV e' medial:    3.92 cm/s LV PW:         1.30 cm   LV E/e' medial:  12.9 LV IVS:        1.30 cm   LV e' lateral:   3.37 cm/s LVOT diam:     2.00 cm   LV E/e' lateral: 15.0 LV SV:         80 LV SV Index:   55 LVOT Area:     3.14 cm  RIGHT VENTRICLE RV Basal diam:  2.10 cm RV Mid diam:    1.10 cm RV S prime:     9.57 cm/s TAPSE (M-mode): 1.6 cm LEFT ATRIUM             Index        RIGHT ATRIUM          Index LA diam:        2.70 cm 1.84 cm/m   RA Area:     9.29 cm LA Vol (A2C):   16.6 ml 11.31 ml/m  RA Volume:   14.90 ml 10.15 ml/m LA Vol (A4C):   32.2 ml 21.94 ml/m LA Biplane Vol: 23.0 ml 15.67 ml/m  AORTIC VALVE                     PULMONIC VALVE AV Area (Vmax):    1.77 cm      PV Vmax:       1.03 m/s AV Area (Vmean):   2.07 cm      PV Peak grad:  4.2 mmHg AV Area (VTI):     1.32 cm AV Vmax:           270.00 cm/s AV Vmean:          176.000 cm/s AV VTI:            0.607 m AV Peak Grad:      29.2 mmHg AV Mean Grad:      14.0 mmHg LVOT Vmax:         152.00 cm/s LVOT Vmean:        116.000 cm/s LVOT VTI:          0.256 m LVOT/AV VTI ratio: 0.42 AI PHT:             413 msec  AORTA Ao Root diam: 3.30 cm Ao Asc diam:  3.80 cm MITRAL VALVE                TRICUSPID VALVE MV Area (PHT): 19.96 cm    TR Peak grad:   29.6 mmHg MV Decel Time: 38 msec      TR Vmax:        272.00 cm/s MR Peak grad: 88.7 mmHg MR Vmax:      471.00 cm/s   SHUNTS MV E velocity: 50.70 cm/s   Systemic VTI:  0.26 m MV A velocity: 114.00 cm/s  Systemic Diam: 2.00 cm MV E/A ratio:  0.44 Mary Scientist, physiological signed by Phineas Inches Signature Date/Time: 08/20/2021/10:14:20 AM    Final    VAS [place] ABI WITH/WO TBI  Result Date: 08/19/2021  LOWER EXTREMITY DOPPLER STUDY Patient Name:  Kara Dennis  Date of Exam:   08/19/2021  Medical Rec #: [id number]      Accession #:    [id number] Date of Birth: Jul 23, 1940       Patient Gender: F Patient Age:   51 years Exam Location:  [facility] Procedure:      VAS [place] ABI WITH/WO TBI Referring Phys: Regan Lemming --------------------------------------------------------------------------------  Indications: Dusky feet. Poor capillary refill. Patient admitted with syncope. High Risk Factors: Hypertension. Other Factors: Dementia.  Limitations: Today's exam was limited due to involuntary patient movement,              arrythmia/tachcardia, and Dementia. Comparison Study: No prior study on file Performing Technologist: Sharion Dove RVS  Examination Guidelines: A complete evaluation includes at minimum, Doppler waveform signals and systolic blood pressure reading at the level of bilateral brachial, anterior tibial, and posterior tibial arteries, when vessel segments are accessible. Bilateral testing is considered an integral part of a complete examination. Photoelectric Plethysmograph (PPG) waveforms and toe systolic pressure readings are included as required and additional duplex testing as needed. Limited examinations for reoccurring indications may be performed as noted.  ABI Findings: +---------+------------------+-----+-----------+--------+ Right    Rt Pressure  (mmHg)IndexWaveform   Comment  +---------+------------------+-----+-----------+--------+ Brachial 110                    multiphasic         +---------+------------------+-----+-----------+--------+ PTA      134               1.22 multiphasic         +---------+------------------+-----+-----------+--------+ DP       124               1.13 multiphasic         +---------+------------------+-----+-----------+--------+ Great Toe0                 0.00                     +---------+------------------+-----+-----------+--------+ +---------+------------------+-----+-----------+-------+ Left     Lt Pressure (mmHg)IndexWaveform   Comment +---------+------------------+-----+-----------+-------+ PTA      115               1.05 multiphasic        +---------+------------------+-----+-----------+-------+ DP       130               1.18 multiphasic        +---------+------------------+-----+-----------+-------+ Great Toe36                0.33                    +---------+------------------+-----+-----------+-------+ +-------+-----------+-----------+------------+------------+ ABI/TBIToday's ABIToday's TBIPrevious ABIPrevious TBI +-------+-----------+-----------+------------+------------+ Right  1.22       absent                              +-------+-----------+-----------+------------+------------+ Left   1.18       0.33                                +-------+-----------+-----------+------------+------------+   Summary: Right: Resting right ankle-brachial index is within normal range. No evidence of significant right lower extremity arterial disease. Unable to attain right toe-brachial index secondary to absent toe pressure. Left: Resting left ankle-brachial index is within normal range. No evidence of significant left lower extremity arterial disease. The left toe-brachial index is abnormal. *  See table(s) above for measurements and observations.      Preliminary    DG Chest Portable 1 View  Result Date: 08/19/2021 CLINICAL DATA:  Syncope. EXAM: PORTABLE CHEST 1 VIEW COMPARISON:  07/20/2021 FINDINGS: Lungs are hyperexpanded. The lungs are clear without focal pneumonia, edema, pneumothorax or pleural effusion. Cardiopericardial silhouette is at upper limits of normal for size. Telemetry leads overlie the chest. IMPRESSION: Hyperexpansion without acute cardiopulmonary findings. Electronically Signed   By: Misty Stanley M.D.   On: 08/19/2021 13:47        Scheduled Meds:  feeding supplement  237 mL Oral QID   heparin  5,000 Units Subcutaneous Q8H   sodium chloride flush  3 mL Intravenous Q12H   Continuous Infusions:  sodium chloride 75 mL/hr at 08/19/21 1647   cefTRIAXone (ROCEPHIN)  IV Stopped (08/19/21 1725)     LOS: 0 days    Time spent: 55 minutes spent on chart review, discussion with nursing staff, consultants, updating family and interview/physical exam; more than 50% of that time was spent in counseling and/or coordination of care.    Geradine Girt, DO [facility] Available via Epic secure chat 7am-7pm After these hours, please refer to coverage provider listed on [email or web address] 08/20/2021, 11:38 AM

## 2021-08-20 NOTE — ED Notes (Signed)
Echo at bedside. Pt uncooperative. Dtr assisting.

## 2021-08-20 NOTE — Consult Note (Signed)
Consultation Note Date: 08/20/2021   Patient Name: Kara Dennis  DOB: 02-25-1941  MRN: [id number]  Age / Sex: 81 y.o., female  PCP: [place]. Referring Physician: Geradine Girt, DO  Reason for Consultation: Establishing goals of care  HPI/Patient Profile: 81 y.o. female  with past medical history of  advanced dementia followed by Dr. Jaynee Eagles of neurology, HTN, and history of cervical cancer s/p hysterectomy admitted on 08/19/2021 with syncopal event.   Patient work-up revealed AKI on CKD 3, syncopal event thought to be due to dehydration and orthostatic hypotension.  Patient was hospitalized May 12 to May 13 for similar presentation.  PMT has been consulted to assist with goals of care conversation.  Clinical Assessment and Goals of Care:  I have reviewed medical records including EPIC notes, labs and imaging, received report from RN, assessed the patient and then met at the bedside with daughter Salli Real to discuss diagnosis prognosis, GOC, EOL wishes, disposition and options.  I introduced Palliative Medicine as specialized medical care for people living with serious illness. It focuses on providing relief from the symptoms and stress of a serious illness. The goal is to improve quality of life for both the patient and the family.  We discussed a brief life review of the patient and then focused on their current illness.  The natural disease trajectory and expectations at EOL were discussed.  I attempted to elicit values and goals of care important to the patient.    Medical History Review and Understanding:  I reviewed patient's advanced dementia in detail, explaining typical trajectory of disease progression and commonly faced complications such as dysphagia, recurrent aspiration pneumonia, recurrent UTIs, worsening debility and weakness.  Patient's daughter tells me Dr. Jaynee Eagles just saw her  yesterday but is unable to share with me what has previously been discussed about dementia or patient's prognosis.  Social History: Salli Real tells me patient's husband died 8 years ago.  Patient typically lives with her son Hubbard Hartshorn during the week and daughter Salli Real on the weekends, but lately she has been mainly at [place] home given difficulty moving her.  Patient has 3 sons and 3 daughters who are all in communication about her needs and health conditions.  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  Her hobbies include eating foods that she enjoys.  Functional and Nutritional State: Patient is nonverbal.  Daughter tells me she is eating slower lately but still able to eat some soft foods with help.  Patient is unable to help with transferring from bed to wheelchair, spends most of her time in her wheelchair.  Dependent for all other ADLs as well.  Code Status: Concepts specific to code status, artifical feeding and hydration, and rehospitalization were considered and discussed.   Discussion: Interpreter phone line was used to have conversation in [place].  Patient's daughter tells me she was unaware of chronic kidney disease diagnosis as we discussed chronic illnesses, syncope, AKI and UTI. Reviewed likely complications and further decline in the setting of advanced dementia.  Attempted to use teach back method to ensure daughter's understanding, but she is unable to restate the information provided and tells me she wants to talk further with her siblings present.   They have never had discussions about her end-of-life preferences.  During review of palliative care and hospice, her daughter inquires whether hospice is euthanasia.  I clarified that hospice is specialized care focused on comfort and quality of life in patients approaching end-of-life.  The  patient is treated for symptoms and the disease process is allowed to progress naturally, without medical interventions aimed at prolonging  life.   Emphasized that due to the progressive and irreversible nature of dementia, it is important to have goals of care conversation and begin anticipatory care planning.  We discussed patient's love of food and I explored daughter's thoughts on whether she would ever want a feeding tube versus comfort feeds in the future. Phuong tells me this will need to be a family decision and they would be able to meet around a week from now.  We discussed that patient is improving back to baseline and she may not still be hospitalized by then.  Outpatient palliative care referral was recommended to continue the conversation, with the option to transition to hospice after all children are updated on prognosis and disease trajectory. Phuong would like to check with her siblings before agreeing to this referral, agrees to discuss again tomorrow.   The difference between aggressive medical intervention and comfort care was considered in light of the patient's goals of care. Hospice and Palliative Care services outpatient were explained and offered.   Discussed the importance of continued conversation with family and the medical providers regarding overall plan of care and treatment options, ensuring decisions are within the context of the patient's values and GOCs.   Questions and concerns were addressed. The family was encouraged to call with questions or concerns.  PMT will continue to support holistically.   SUMMARY OF RECOMMENDATIONS   -Full code/Full scope treatment -Family will need ongoing education on dementia and failure to thrive as patient continues to decline -Patient's daughter will discuss palliative care referral and CODE STATUS with her siblings, will benefit from a home visit as they would be unable to have a family meeting within the next week -Patient will likely need transition to hospice once all children are educated and familiarized with this resource -PMT will continue to  follow  Prognosis:  Hospice appropriate if aligned with goals of care given advanced dementia, functional/nutritional decline  Discharge Planning: Home with Palliative Services      Primary Diagnoses: Present on Admission:  Syncope  Prediabetes  Elevated troponin  Dementia ([place])  Hyponatremia  Thyroid nodule    Physical Exam Vitals and nursing note reviewed.  Constitutional:      General: She is not in acute distress. Cardiovascular:     Rate and Rhythm: Normal rate.  Pulmonary:     Effort: Pulmonary effort is normal.  Skin:    General: Skin is warm and dry.  Neurological:     Mental Status: She is alert. Mental status is at baseline.     Vital Signs: BP 128/79   Pulse 85   Temp 98.8 F (37.1 C) (Oral)   Resp 15   SpO2 100%  Pain Scale: 0-10   Pain Score: 0-No pain   SpO2: SpO2: 100 % O2 Device:SpO2: 100 % O2 Flow Rate: .    Palliative Assessment/Data:      MDM: High   Lilit Cinelli Johnnette Litter, PA-C  Palliative Medicine Team Team phone # [phone]  Thank you for allowing the Palliative Medicine Team to assist in the care of this patient. Please utilize secure chat with additional questions, if there is no response within 30 minutes please call the above phone number.  Palliative Medicine Team providers are available by phone from 7am to 7pm daily and can be reached through the team cell phone.  Should this patient require  assistance outside of these hours, please call the patient's attending physician.

## 2021-08-21 DIAGNOSIS — R55 Syncope and collapse: Secondary | ICD-10-CM | POA: Diagnosis not present

## 2021-08-21 DIAGNOSIS — F03C0 Unspecified dementia, severe, without behavioral disturbance, psychotic disturbance, mood disturbance, and anxiety: Secondary | ICD-10-CM | POA: Diagnosis not present

## 2021-08-21 DIAGNOSIS — N179 Acute kidney failure, unspecified: Secondary | ICD-10-CM | POA: Diagnosis not present

## 2021-08-21 DIAGNOSIS — E860 Dehydration: Secondary | ICD-10-CM | POA: Diagnosis not present

## 2021-08-21 LAB — BASIC METABOLIC PANEL
Anion gap: 3 — ABNORMAL LOW (ref 5–15)
BUN: 26 mg/dL — ABNORMAL HIGH (ref 8–23)
CO2: 19 mmol/L — ABNORMAL LOW (ref 22–32)
Calcium: 9 mg/dL (ref 8.9–10.3)
Chloride: 117 mmol/L — ABNORMAL HIGH (ref 98–111)
Creatinine, Ser: 1.32 mg/dL — ABNORMAL HIGH (ref 0.44–1.00)
GFR, Estimated: 41 mL/min — ABNORMAL LOW (ref >60)
Glucose, Bld: 105 mg/dL — ABNORMAL HIGH (ref 70–99)
Potassium: 4.9 mmol/L (ref 3.5–5.1)
Sodium: 139 mmol/L (ref 135–145)

## 2021-08-21 LAB — URINE CULTURE: Culture: NO GROWTH

## 2021-08-21 NOTE — Progress Notes (Signed)
Mobility Specialist Progress Note   08/21/21 1740  Orthostatic Lying   BP- Lying 121/72  Orthostatic Sitting  BP- Sitting 109/66  Orthostatic Standing at 3 minutes  BP- Standing at 3 minutes (!) 89/66  Mobility  Activity Ambulated with assistance in hallway  Level of Assistance +2 (takes two people)  Assistive Device  (HHA)  Distance Ambulated (ft) 90 ft  Activity Response Tolerated well  $Mobility charge 1 Mobility   Pre Mobility: 121/72 BP During Mobility: 109/66 BP Post Mobility: 89/66 BP  Received pt in bed having no complaints, according to her daughter and agreeable to mobility. Pt requiring minA to get EOB d/t orientation and language barrier as well as a +2 HHA during ambulation. During changes in position, pt's BP slowly dropped all while being asymptomatic. Pt still eager to ambulate and ambulated for a short distance w/o fault. Returned back to bed w/o complaint, call bell in reach and all needs met. RN notified about BP's.   Holland Falling Mobility Specialist Phone Number [phone]

## 2021-08-21 NOTE — Progress Notes (Signed)
Initial Nutrition Assessment  DOCUMENTATION CODES:   Not applicable  INTERVENTION:  Encourage adequate PO intake Ensure Enlive po QID, each supplement provides 350 kcal and 20 grams of protein. Recommend SLP evaluation for appropriate diet recommendations  Request updated weight  NUTRITION DIAGNOSIS:   Inadequate oral intake related to chronic illness as evidenced by per patient/family report.  GOAL:   Patient will meet greater than or equal to 90% of their needs  MONITOR:   PO intake, Supplement acceptance, Diet advancement, Labs, Weight trends  REASON FOR ASSESSMENT:   Consult Assessment of nutrition requirement/status  ASSESSMENT:   Kara Dennis admitted with syncope likely d/t orthostatic hypotension. PMH significant for advanced dementia, HTN and h/o cervical cancer s/p hysterectomy.  Per review of chart, Kara Dennis noted to require assistance with all ADLs and does not speak much. Her family reports that she drinks a lot of fluids and typically consumes Ensure daily. It appears Ensure is her only source of nutrition and she is not eating much.   Spoke with Kara Dennis's daughter at bedside utilizing interpreter services [phone]). She states that about 4-5 months ago Kara Dennis was eating well and feeding herself. She has had a decrease in appetite recently and d/t her advanced dementia, has not eaten as well as she was previously. She does state that if they encourage her to eat or provide feeding assistance, she is likely to eat. At home she recalls intake of 1-2 Ensure daily. Given Kara Dennis has been on a full liquid diet during admission, it's noted she has been ordered Ensure QID. Encouraged her to continue offering these after admission as tolerated. Denies noticeable difficulty with chewing or swallowing when eating a regular diet. However discussed with MD possibility of SLP evaluation prior to d/c to ensure Kara Dennis goes home with appropriate diet and meal recommendations given advanced dementia and  decreased ability to feed herself.   Kara Dennis's daughter states that she has been requiring more mobility assistance. They have been helping her walk intermittently, however primarily utilize a wheelchair for ADLs as her legs have been hurting more recently.   She reports that her usual wt is ~150 lbs and within the last 6-7 months endorses a wt loss of ~40 lbs. There is limited documentation of wt hx within the last year, however it appears she has had wt loss within the last 2 years. Last documented wt was 50.1 kg on 05/13. Uncertain if Kara Dennis has had additional recent wt loss. Will request updated wt to reassess.  Medications reviewed  Labs: BUN 26, Cr 1.32, anion gap 3, GFR 41, CBG 123  I/O's: +31102m since admission   NUTRITION - FOCUSED PHYSICAL EXAM:  Flowsheet Row Most Recent Value  Orbital Region No depletion  Upper Arm Region Mild depletion  Thoracic and Lumbar Region No depletion  Buccal Region No depletion  Temple Region No depletion  Clavicle Bone Region No depletion  Clavicle and Acromion Bone Region No depletion  Scapular Bone Region No depletion  Dorsal Hand Mild depletion  Patellar Region Mild depletion  Anterior Thigh Region No depletion  Posterior Calf Region Mild depletion  Edema (RD Assessment) None  Hair Reviewed  Eyes Reviewed  Mouth Unable to assess  Skin Reviewed  Nails Reviewed       Diet Order:   Diet Order             Diet full liquid Room service appropriate? Yes; Fluid consistency: Thin  Diet effective now  EDUCATION NEEDS:   Education needs have been addressed  Skin:  Skin Assessment: Reviewed RN Assessment  Last BM:  6/12 (type 7)  Height:   Ht Readings from Last 1 Encounters:  07/20/21 '5\' 1"'$  (1.549 m)    Weight:   Wt Readings from Last 1 Encounters:  07/21/21 50.1 kg   BMI:  There is no height or weight on file to calculate BMI.  Estimated Nutritional Needs:   Kcal:  1500-1700  Protein:  70-85g  Fluid:   >/=1.5L  Clayborne Dana, RDN, LDN Clinical Nutrition

## 2021-08-21 NOTE — Evaluation (Signed)
Physical Therapy Evaluation Patient Details Name: Kara Dennis MRN: [id number] DOB: [birth year] Today's Date: 08/21/2021  History of Present Illness  81 y.o. female  admitted on 08/19/2021 with syncopal event.  Found to have AKI and dehydration. PMH: advanced dementia followed by Dr. Jaynee Eagles of neurology, HTN, and history of cervical cancer s/p hysterectomy  Clinical Impression  Pt admitted with above diagnosis. Pt was able to ambulate with PT and family with +2 assist. Pt may be close to baseline however will follow a visit or 2 to ensure that pt progresses as she has had a recent stay in hospital.  Asked mobility team to see pt as well.   Pt currently with functional limitations due to the deficits listed below (see PT Problem List). Pt will benefit from skilled PT to increase their independence and safety with mobility to allow discharge to the venue listed below.          Recommendations for follow up therapy are one component of a multi-disciplinary discharge planning process, led by the attending physician.  Recommendations may be updated based on patient status, additional functional criteria and insurance authorization.  Follow Up Recommendations No PT follow up    Assistance Recommended at Discharge Frequent or constant Supervision/Assistance  Patient can return home with the following  Two people to help with walking and/or transfers;A lot of help with bathing/dressing/bathroom;Assistance with feeding;Assistance with cooking/housework;Assist for transportation;Help with stairs or ramp for entrance    Equipment Recommendations None recommended by PT (issued gait belt)  Recommendations for Other Services       Functional Status Assessment Patient has had a recent decline in their functional status and demonstrates the ability to make significant improvements in function in a reasonable and predictable amount of time.     Precautions / Restrictions Precautions Precautions:  Fall Restrictions Weight Bearing Restrictions: No      Mobility  Bed Mobility Overal bed mobility: Needs Assistance Bed Mobility: Supine to Sit     Supine to sit: Min assist     General bed mobility comments: Needed cues and guidance to EOB    Transfers Overall transfer level: Needs assistance Equipment used: 2 person hand held assist Transfers: Sit to/from Stand Sit to Stand: Min assist           General transfer comment: Needs min assist and HHA of 2 to come to EOB and stand.    Ambulation/Gait Ambulation/Gait assistance: Min assist, +2 safety/equipment Gait Distance (Feet): 125 Feet Assistive device: 2 person hand held assist Gait Pattern/deviations: Step-through pattern, Decreased stride length   Gait velocity interpretation: 1.31 - 2.62 ft/sec, indicative of limited community ambulator   General Gait Details: Pt family present and walking with pt and therapist holding bil hands. Pt able to walk more per family than she was recently.  No LOB today.  Family states that they always have +2 HHA at home.  Pt did well today and no c/o.  Stairs            Wheelchair Mobility    Modified Rankin (Stroke Patients Only)       Balance Overall balance assessment: Needs assistance Sitting-balance support: No upper extremity supported, Feet supported Sitting balance-Leahy Scale: Fair     Standing balance support: No upper extremity supported, During functional activity Standing balance-Leahy Scale: Poor Standing balance comment: relies on bil UE support for balance  Pertinent Vitals/Pain Pain Assessment Pain Assessment: No/denies pain    Home Living Family/patient expects to be discharged to:: Hospice/Palliative care Living Arrangements: Children Available Help at Discharge: Family Type of Home: House Home Access: Stairs to enter       Home Layout: Two level Home Equipment: Grab bars - tub/shower;Rolling  Environmental consultant (2 wheels);Rollator (4 wheels);BSC/3in1;Shower seat;Wheelchair - Banker      Prior Function Prior Level of Function : Needs assist             Mobility Comments: Daughter cues and assists pt OOB with apparent min assist; transfer and gait with min assist (HHA) ADLs Comments: Daughter does all ADLs for pt     Hand Dominance        Extremity/Trunk Assessment   Upper Extremity Assessment Upper Extremity Assessment: Defer to OT evaluation    Lower Extremity Assessment Lower Extremity Assessment: Overall WFL for tasks assessed    Cervical / Trunk Assessment Cervical / Trunk Assessment: Normal  Communication   Communication: Prefers language other than English;Expressive difficulties ([place])  Cognition Arousal/Alertness: Awake/alert Behavior During Therapy: Flat affect Overall Cognitive Status: History of cognitive impairments - at baseline                                 General Comments: advanced dementia        General Comments      Exercises     Assessment/Plan    PT Assessment Patient needs continued PT services  PT Problem List Decreased activity tolerance;Decreased balance;Decreased mobility;Decreased knowledge of use of DME;Decreased safety awareness       PT Treatment Interventions DME instruction;Gait training;Functional mobility training;Stair training;Therapeutic activities;Therapeutic exercise;Balance training;Patient/family education    PT Goals (Current goals can be found in the Care Plan section)  Acute Rehab PT Goals Patient Stated Goal: to go home PT Goal Formulation: With patient Time For Goal Achievement: 09/04/21 Potential to Achieve Goals: Good    Frequency Min 3X/week     Co-evaluation               AM-PAC PT "6 Clicks" Mobility  Outcome Measure Help needed turning from your back to your side while in a flat bed without using bedrails?: A Little Help needed moving from lying on  your back to sitting on the side of a flat bed without using bedrails?: A Little Help needed moving to and from a bed to a chair (including a wheelchair)?: Total Help needed standing up from a chair using your arms (e.g., wheelchair or bedside chair)?: Total Help needed to walk in hospital room?: Total Help needed climbing 3-5 steps with a railing? : Total 6 Click Score: 10    End of Session Equipment Utilized During Treatment: Gait belt Activity Tolerance: Patient tolerated treatment well Patient left: in chair;with call bell/phone within reach;with chair alarm set;with family/visitor present Nurse Communication: Mobility status PT Visit Diagnosis: Muscle weakness (generalized) (M62.81)    Time: 0174-9449 PT Time Calculation (min) (ACUTE ONLY): 19 min   Charges:   PT Evaluation $PT Eval Moderate Complexity: 1 Mod          Osmany Azer M,PT Acute Rehab Services [phone]   Alvira Philips 08/21/2021, 10:38 AM

## 2021-08-21 NOTE — Progress Notes (Addendum)
PROGRESS NOTE    Kara Dennis  JSE:[id number] DOB: 1940-05-29 DOA: 08/19/2021 PCP: [facility].    Brief Narrative:  Kara Dennis is a 81 y.o. female with past medical history of dementia followed by neurology as outpatient, hypertension and history of cervical cancer status post hysterectomy presented to hospital after witnessed syncopal episode.  History was obtained from patient's daughter with the help of interpreter services.  At baseline patient needs assistance to perform all ADLs and does not speak much.  Patient was recently hospitalized from 5/12-5/13 after having syncopal event and was found to have AKI.  Scans negative at that time.  Patient had undergone EEG which was negative as well.  She was supposed to have  30-day event monitor as outpatient.  Patient was then admitted to hospital for further evaluation and treatment.  Assessment and Plan: Principal Problem:   Syncope Active Problems:   Acute kidney injury superimposed on chronic kidney disease (HCC)   Thyroid nodule   Dementia (HCC)   Prediabetes   Elevated troponin   Hyponatremia   Dehydration   Syncope Patient presented with witnessed syncopal episode at home.  Patient was recently admitted to the hospital for the same and had negative CT scan and CTA EEG.  She improved after IV hydration.  Patient was recommended outpatient echocardiogram and possibly 30-day event monitor.  Possibly secondary to volume depletion at this time.  Continue IV fluids.  Check orthostatic vitals.    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  Baseline around 1.2.  Received IV fluids and is currently on 75 mill per hour.  Latest creatinine of 1.3.  Reassess the need for fluid in a.m.  Elevated troponin Troponin bilateral.  EKG normal sinus rhythm.  Likely demand ischemia.  2D echocardiogram with LV ejection fraction of 60 to 65% with no regional wall motion  abnormality.     Abnormal urinalysis Urinalysis showed 11-20 WBC.  Urine cultures no growth.  Received 3 days of Rocephin.  We will discontinue antibiotic.   Possible peripheral arterial disease - ABI w TBI- no evidence of significant disease   Advanced dementia Patient has advanced dementia and is mostly nonverbal.  Palliative care has been consulted at this time.   Hyperglycemia prediabetes Latest hemoglobin A1c was 5.9 from 11/2018. Continue sliding scale insulin.   Hyponatremia Improved after IV fluids.  Monitor BMP.   Thyroid nodules TSH within normal limits.  Primary care following as outpatient and has ordered thyroid ultrasound as outpatient.  DVT prophylaxis: heparin injection 5,000 Units Start: 08/19/21 1530    Code Status: Full Code  Family Communication:  Communicated with the patient's daughter at bedside.  Disposition Plan:  Level of care: Med-Surg  Status is: Inpatient  The patient IS inpatient because: IVF, palliative care on board, discerning goals of care   Consultants:  Palliative care  Subjective: Today, patient was seen and examined at bedside patient is daughter at bedside.  There is no mention of fever pain nausea vomiting.  Mention of some dark stool.  Objective: Vitals:   08/20/21 1656 08/20/21 2055 08/21/21 0441 08/21/21 1220  BP: 131/70 (!) 151/82 125/67   Pulse: 75 89 81 74  Resp: '17 18 19   '$ Temp: 97.7 F (36.5 C) 97.9 F (36.6 C)  (!) 97.5 F (36.4 C)  TempSrc: Oral Oral  Oral  SpO2: 100% 90% 92%     Intake/Output Summary (Last  24 hours) at 08/21/2021 1303 Last data filed at 08/20/2021 2143 Gross per 24 hour  Intake 2007.27 ml  Output --  Net 2007.27 ml    There is no height or weight on file to calculate BMI.   Physical examination: General:  Average built, not in obvious distress HENT:   No scleral pallor or icterus noted. Oral mucosa is moist.  Chest:  Clear breath sounds.  Diminished breath sounds bilaterally. No  crackles or wheezes.  CVS: S1 &S2 heard. No murmur.  Regular rate and rhythm. Abdomen: Soft, nontender, nondistended.  Bowel sounds are heard.   Extremities: No cyanosis, clubbing or edema.  Peripheral pulses are palpable. Psych: Alert, awake and communicative, language barrier normal mood CNS:  No cranial nerve deficits.  Power equal in all extremities.   Skin: Warm and dry.  No rashes noted.  Data Reviewed: I have personally reviewed following labs and imaging studies  CBC: Recent Labs  Lab 08/19/21 1015 08/20/21 0615  WBC 7.5 4.9  HGB 14.8 11.8*  HCT 42.4 34.2*  MCV 94.0 94.2  PLT 258 779    Basic Metabolic Panel: Recent Labs  Lab 08/19/21 1015 08/20/21 0615 08/21/21 0207  NA 134* 138 139  K 5.1 4.8 4.9  CL 102 113* 117*  CO2 21* 17* 19*  GLUCOSE 196* 98 105*  BUN 52* 36* 26*  CREATININE 2.02* 1.58* 1.32*  CALCIUM 10.0 8.8* 9.0    GFR: CrCl cannot be calculated (Unknown ideal weight.). Liver Function Tests: No results for input(s): "AST", "ALT", "ALKPHOS", "BILITOT", "PROT", "ALBUMIN" in the last 168 hours. No results for input(s): "LIPASE", "AMYLASE" in the last 168 hours. No results for input(s): "AMMONIA" in the last 168 hours. Coagulation Profile: No results for input(s): "INR", "PROTIME" in the last 168 hours. Cardiac Enzymes: No results for input(s): "CKTOTAL", "CKMB", "CKMBINDEX", "TROPONINI" in the last 168 hours. BNP (last 3 results) No results for input(s): "PROBNP" in the last 8760 hours. HbA1C: No results for input(s): "HGBA1C" in the last 72 hours. CBG: Recent Labs  Lab 08/19/21 1303  GLUCAP 123*    Lipid Profile: No results for input(s): "CHOL", "HDL", "LDLCALC", "TRIG", "CHOLHDL", "LDLDIRECT" in the last 72 hours. Thyroid Function Tests: No results for input(s): "TSH", "T4TOTAL", "FREET4", "T3FREE", "THYROIDAB" in the last 72 hours. Anemia Panel: No results for input(s): "VITAMINB12", "FOLATE", "FERRITIN", "TIBC", "IRON", "RETICCTPCT"  in the last 72 hours. Sepsis Labs: Recent Labs  Lab 08/19/21 1316  LATICACIDVEN 1.6     Recent Results (from the past 240 hour(s))  Urine Culture     Status: None   Collection Time: 08/19/21 10:15 AM   Specimen: Urine, Clean Catch  Result Value Ref Range Status   Specimen Description URINE, CLEAN CATCH  Final   Special Requests NONE  Final   Culture   Final    NO GROWTH Performed at [facility], [address] [place], [place] [address]    Report Status 08/21/2021 FINAL  Final     Radiology Studies: VAS [place] ABI WITH/WO TBI  Result Date: 08/20/2021  LOWER EXTREMITY DOPPLER STUDY Patient Name:  Kara Dennis  Date of Exam:   08/19/2021 Medical Rec #: [id number]      Accession #:    [id number] Date of Birth: 1940/11/16       Patient Gender: F Patient Age:   22 years Exam Location:  [facility] Procedure:      VAS [place] ABI WITH/WO TBI Referring Phys: Regan Lemming --------------------------------------------------------------------------------  Indications: Dusky feet. Poor capillary refill. Patient admitted with syncope. High Risk Factors: Hypertension. Other Factors: Dementia.  Limitations: Today's exam was limited due to involuntary patient movement,              arrythmia/tachcardia, and Dementia. Comparison Study: No prior study on file Performing Technologist: Sharion Dove RVS  Examination Guidelines: A complete evaluation includes at minimum, Doppler waveform signals and systolic blood pressure reading at the level of bilateral brachial, anterior tibial, and posterior tibial arteries, when vessel segments are accessible. Bilateral testing is considered an integral part of a complete examination. Photoelectric Plethysmograph (PPG) waveforms and toe systolic pressure readings are included as required and additional duplex testing as needed. Limited examinations for reoccurring indications may be performed as noted.  ABI Findings:  +---------+------------------+-----+-----------+--------+ Right    Rt Pressure (mmHg)IndexWaveform   Comment  +---------+------------------+-----+-----------+--------+ Brachial 110                    multiphasic         +---------+------------------+-----+-----------+--------+ PTA      134               1.22 multiphasic         +---------+------------------+-----+-----------+--------+ DP       124               1.13 multiphasic         +---------+------------------+-----+-----------+--------+ Great Toe0                 0.00                     +---------+------------------+-----+-----------+--------+ +---------+------------------+-----+-----------+-------+ Left     Lt Pressure (mmHg)IndexWaveform   Comment +---------+------------------+-----+-----------+-------+ PTA      115               1.05 multiphasic        +---------+------------------+-----+-----------+-------+ DP       130               1.18 multiphasic        +---------+------------------+-----+-----------+-------+ Great Toe36                0.33                    +---------+------------------+-----+-----------+-------+ +-------+-----------+-----------+------------+------------+ ABI/TBIToday's ABIToday's TBIPrevious ABIPrevious TBI +-------+-----------+-----------+------------+------------+ Right  1.22       absent                              +-------+-----------+-----------+------------+------------+ Left   1.18       0.33                                +-------+-----------+-----------+------------+------------+   Summary: Right: Resting right ankle-brachial index is within normal range. No evidence of significant right lower extremity arterial disease. Unable to attain right toe-brachial index secondary to absent toe pressure. Left: Resting left ankle-brachial index is within normal range. No evidence of significant left lower extremity arterial disease. The left toe-brachial index  is abnormal. *See table(s) above for measurements and observations.  Electronically signed by Servando Snare MD on 08/20/2021 at 6:00:22 PM.    Final    ECHOCARDIOGRAM COMPLETE  Result Date: 08/20/2021    ECHOCARDIOGRAM REPORT   Patient Name:   Kara Dennis [facility] Date of Exam: 08/20/2021 Medical Rec #:  [id number]  Height:       61.0 in Accession #:    [id number]    Weight:       110.4 lb Date of Birth:  May 16, 1940      BSA:          1.468 m Patient Age:    57 years      BP:           126/68 mmHg Patient Gender: F             HR:           83 bpm. Exam Location:  Inpatient Procedure: 2D Echo, Color Doppler and Cardiac Doppler Indications:    Elevated troponin , Syncope  History:        Patient has prior history of Echocardiogram examinations, most                 recent 12/15/2017. Risk Factors:Hypertension. Dementia.  Sonographer:    Joette Catching RCS Referring Phys: [phone] RONDELL A SMITH  Sonographer Comments: Image acquisition challenging due to uncooperative patient. IMPRESSIONS  1. Mild LVOT outflow obstruction. Left ventricular ejection fraction, by estimation, is 60 to 65%. The left ventricle has normal function. The left ventricle has no regional wall motion abnormalities. There is moderate asymmetric left ventricular hypertrophy. Left ventricular diastolic parameters are consistent with Grade I diastolic dysfunction (impaired relaxation).  2. Right ventricular systolic function is normal. The right ventricular size is normal.  3. A small pericardial effusion is present. There is no evidence of cardiac tamponade.  4. No evidence of mitral valve regurgitation.  5. There is mild calcification of the aortic valve. Aortic valve regurgitation is mild. Mild aortic valve stenosis.  6. Aortic no significant ascending aortic disease.  7. The inferior vena cava is normal in size with greater than 50% respiratory variability, suggesting right atrial pressure of 3 mmHg. Comparison(s): No prior Echocardiogram.  Conclusion(s)/Recommendation(s): Recommend fluid resuscitation. FINDINGS  Left Ventricle: Mild LVOT outflow obstruction. Left ventricular ejection fraction, by estimation, is 60 to 65%. The left ventricle has normal function. The left ventricle has no regional wall motion abnormalities. The left ventricular internal cavity size was normal in size. There is moderate asymmetric left ventricular hypertrophy. Left ventricular diastolic parameters are consistent with Grade I diastolic dysfunction (impaired relaxation). Right Ventricle: The right ventricular size is normal. Right ventricular systolic function is normal. Left Atrium: Left atrial size was normal in size. Right Atrium: Right atrial size was normal in size. Pericardium: A small pericardial effusion is present. There is no evidence of cardiac tamponade. Mitral Valve: No evidence of mitral valve regurgitation. Tricuspid Valve: Tricuspid valve regurgitation is trivial. Aortic Valve: There is mild calcification of the aortic valve. Aortic valve regurgitation is mild. Aortic regurgitation PHT measures 413 msec. Mild aortic stenosis is present. Aortic valve mean gradient measures 14.0 mmHg. Aortic valve peak gradient measures 29.2 mmHg. Aortic valve area, by VTI measures 1.32 cm. Pulmonic Valve: Pulmonic valve regurgitation is not visualized. Aorta: No significant ascending aortic disease. Venous: The inferior vena cava is normal in size with greater than 50% respiratory variability, suggesting right atrial pressure of 3 mmHg.  LEFT VENTRICLE PLAX 2D LVIDd:         3.30 cm   Diastology LVIDs:         2.40 cm   LV e' medial:    3.92 cm/s LV PW:         1.30 cm   LV E/e' medial:  12.9 LV  IVS:        1.30 cm   LV e' lateral:   3.37 cm/s LVOT diam:     2.00 cm   LV E/e' lateral: 15.0 LV SV:         80 LV SV Index:   55 LVOT Area:     3.14 cm  RIGHT VENTRICLE RV Basal diam:  2.10 cm RV Mid diam:    1.10 cm RV S prime:     9.57 cm/s TAPSE (M-mode): 1.6 cm LEFT ATRIUM              Index        RIGHT ATRIUM          Index LA diam:        2.70 cm 1.84 cm/m   RA Area:     9.29 cm LA Vol (A2C):   16.6 ml 11.31 ml/m  RA Volume:   14.90 ml 10.15 ml/m LA Vol (A4C):   32.2 ml 21.94 ml/m LA Biplane Vol: 23.0 ml 15.67 ml/m  AORTIC VALVE                     PULMONIC VALVE AV Area (Vmax):    1.77 cm      PV Vmax:       1.03 m/s AV Area (Vmean):   2.07 cm      PV Peak grad:  4.2 mmHg AV Area (VTI):     1.32 cm AV Vmax:           270.00 cm/s AV Vmean:          176.000 cm/s AV VTI:            0.607 m AV Peak Grad:      29.2 mmHg AV Mean Grad:      14.0 mmHg LVOT Vmax:         152.00 cm/s LVOT Vmean:        116.000 cm/s LVOT VTI:          0.256 m LVOT/AV VTI ratio: 0.42 AI PHT:            413 msec  AORTA Ao Root diam: 3.30 cm Ao Asc diam:  3.80 cm MITRAL VALVE                TRICUSPID VALVE MV Area (PHT): 19.96 cm    TR Peak grad:   29.6 mmHg MV Decel Time: 38 msec      TR Vmax:        272.00 cm/s MR Peak grad: 88.7 mmHg MR Vmax:      471.00 cm/s   SHUNTS MV E velocity: 50.70 cm/s   Systemic VTI:  0.26 m MV A velocity: 114.00 cm/s  Systemic Diam: 2.00 cm MV E/A ratio:  0.44 Mary Scientist, physiological signed by Phineas Inches Signature Date/Time: 08/20/2021/10:14:20 AM    Final    DG Chest Portable 1 View  Result Date: 08/19/2021 CLINICAL DATA:  Syncope. EXAM: PORTABLE CHEST 1 VIEW COMPARISON:  07/20/2021 FINDINGS: Lungs are hyperexpanded. The lungs are clear without focal pneumonia, edema, pneumothorax or pleural effusion. Cardiopericardial silhouette is at upper limits of normal for size. Telemetry leads overlie the chest. IMPRESSION: Hyperexpansion without acute cardiopulmonary findings. Electronically Signed   By: Misty Stanley M.D.   On: 08/19/2021 13:47      Scheduled Meds:  feeding supplement  237 mL Oral QID   heparin  5,000 Units Subcutaneous Q8H   sodium  chloride flush  3 mL Intravenous Q12H   Continuous Infusions:  sodium chloride 75 mL/hr at 08/19/21 1647    cefTRIAXone (ROCEPHIN)  IV 1 g (08/20/21 1707)     LOS: 1 day    Flora Lipps, MD [facility] Available via Epic secure chat 7am-7pm After these hours, please refer to coverage provider listed on [email or web address] 08/21/2021, 1:03 PM

## 2021-08-21 NOTE — Plan of Care (Signed)

## 2021-08-21 NOTE — Progress Notes (Signed)
Daily Progress Note   Patient Name: Kara Dennis       Date: 08/21/2021 DOB: 01-17-1941  Age: 81 y.o. MRN#: [id number] Attending Physician: Flora Lipps, MD Primary Care Physician: Walker Date: 08/19/2021  Reason for Consultation/Follow-up: Establishing goals of care  Subjective: Medical records reviewed. Patient assessed at the bedside.  She is resting comfortably.  Her daughter is present at the bedside, [place] interpreter phone line was used for the conversation.  Created space and opportunity for daughter's thoughts and feelings on patient's current illness.  She reports that her mother is doing much better today, was very conversive up until her IV was worked on.  After this she was not very happy and has refused to talk further.  Followed up on whether she has discussed with her siblings about outpatient palliative care referral to continue goals of care conversation when more of them are available to meet together.  She talked to her brother and agrees for a referral.  She notes her concern that the 2 of them are really the only ones who take care of her mother.  This is becoming harder and harder to do.  One of their siblings lives in [place], others are more worried about working and "making money" than being there for the patient.  I provided reassurance that while all siblings legally would have input in decision-making, the medical team would work with those were reasonably available and ongoing goals of care discussions will be beneficial with whoever would be willing to attend.  Followed up on her thoughts on CODE STATUS -she has not discussed this further and was waiting to meet in person with her brother.  She notes she would be leaning towards a full  code.  I shared my worry that in patient's condition and with her comorbidities, this would likely cause more harm than benefit.  Recommended consideration of DNR status, understanding evidenced-based poor outcomes in similar hospitalized patients, as the cause of the arrest is likely associated with chronic/terminal disease rather than a reversible acute cardio-pulmonary event.   Patient's daughter endorses there needs to be some change in how they care for her at home.  She is hopeful for more resources and assistance, but it remains unclear if she would be willing to consider hospice philosophy.  Provided  with hard choices booklet and reiterated that hospice would not expedite the dying process, but rather provide more assistance and focus on patient's quality of life as the natural disease process goes on.  Questions and concerns addressed. PMT will continue to support holistically.   Length of Stay: 1   Physical Exam Vitals and nursing note reviewed.  Constitutional:      General: She is not in acute distress. Cardiovascular:     Rate and Rhythm: Normal rate.  Pulmonary:     Effort: Pulmonary effort is normal.  Skin:    General: Skin is warm and dry.  Neurological:     Mental Status: She is alert. Mental status is at baseline. She is disoriented.             Vital Signs: BP 125/67 (BP Location: Left Arm)   Pulse 74   Temp (!) 97.5 F (36.4 C) (Oral)   Resp 19   SpO2 92%  SpO2: SpO2: 92 % O2 Device: O2 Device: Room Air O2 Flow Rate:        Palliative Assessment/Data: 40%       Palliative Care Assessment & Plan   Patient Profile: 81 y.o. female  with past medical history of  advanced dementia followed by Dr. Jaynee Eagles of neurology, HTN, and history of cervical cancer s/p hysterectomy admitted on 08/19/2021 with syncopal event.    Patient work-up revealed AKI on CKD 3, syncopal event thought to be due to dehydration and orthostatic hypotension.  Patient was hospitalized May  12 to May 13 for similar presentation.  PMT has been consulted to assist with goals of care conversation.  Assessment: Advanced dementia AKI, improving Dehydration Goals of care conversation  Recommendations/Plan: Continue full code/full scope treatment Patient's family will require ongoing conversations and education, does not appear daughter is ready for hospice quite yet Daughter is agreeable to outpatient palliative care referral Psychosocial and emotional support provided PMT will continue to follow as needed   Prognosis: Poor  Discharge Planning: Home with Palliative Services  Care plan was discussed with patient's daughter   Total time: I spent 50 minutes in the care of the patient today in the above activities and documenting the encounter.   Alexsys Eskin Johnnette Litter, PA-C  Palliative Medicine Team Team phone # [phone]  Thank you for allowing the Palliative Medicine Team to assist in the care of this patient. Please utilize secure chat with additional questions, if there is no response within 30 minutes please call the above phone number.  Palliative Medicine Team providers are available by phone from 7am to 7pm daily and can be reached through the team cell phone.  Should this patient require assistance outside of these hours, please call the patient's attending physician.

## 2021-08-22 NOTE — Progress Notes (Signed)
Discharge packet summary was provided to daughter by a Sport and exercise psychologist. Pt d/c to home as ordered. Per daughter her husband will come over to pick them up. No complaints.

## 2021-08-22 NOTE — Evaluation (Signed)
Clinical/Bedside Swallow Evaluation Patient Details  Name: Kara Dennis MRN: [id number] Date of Birth: 21-Jun-1940  Today's Date: 08/22/2021 Time: SLP Start Time (ACUTE ONLY): 0845 SLP Stop Time (ACUTE ONLY): 4401 SLP Time Calculation (min) (ACUTE ONLY): 12 min  Past Medical History:  Past Medical History:  Diagnosis Date   Bradycardia 2014    identified before hysterectomy, treated for short time with medication before during and after hysterectomy    Cervical cancer ([place]) 2014    s/p hysterectomy, no chemo or radiation    Dementia ([place])    HTN (hypertension) 2013   took some medicine in [place]    Past Surgical History:  Past Surgical History:  Procedure Laterality Date   ABDOMINAL HYSTERECTOMY  05/2012    HPI:  81 y.o. female  admitted on 08/19/2021 with syncopal event.  Found to have AKI and dehydration. PMH: advanced dementia followed by Dr. Jaynee Eagles of neurology, HTN, and history of cervical cancer s/p hysterectomy    Assessment / Plan / Recommendation  Clinical Impression  Pt exhibited normal oropharyngeal stages of swallow function with coordinated, controlled, strong labial and lingual manipulation to transit boluses and initiate swallow from subjective view. Daughter present and translated. There were no s/sx of airway intrusion. Daugher reported pt coughed with jello yesterday. Oral clearance noted post swallows. Pt's vocal quality is adequate and she has upper and lower dentures plates. If food is hard pt will expectorate according to daughter. Daughter wished to have regular texture versus chopped meats and therapist in agreement. Upgraded diet to regular, continue thin, pills with thin and no further assist needed. SLP Visit Diagnosis: Dysphagia, unspecified (R13.10)    Aspiration Risk  Mild aspiration risk    Diet Recommendation Regular;Thin liquid   Liquid Administration via: Cup;Straw Medication Administration: Whole meds with liquid Supervision: Intermittent  supervision to cue for compensatory strategies;Patient able to self feed Compensations: Minimize environmental distractions;Slow rate;Small sips/bites Postural Changes: Seated upright at 90 degrees    Other  Recommendations Oral Care Recommendations: Oral care BID    Recommendations for follow up therapy are one component of a multi-disciplinary discharge planning process, led by the attending physician.  Recommendations may be updated based on patient status, additional functional criteria and insurance authorization.  Follow up Recommendations No SLP follow up      Assistance Recommended at Discharge None  Functional Status Assessment    Frequency and Duration            Prognosis        Swallow Study   General Date of Onset: 08/19/21 HPI: 81 y.o. female  admitted on 08/19/2021 with syncopal event.  Found to have AKI and dehydration. PMH: advanced dementia followed by Dr. Jaynee Eagles of neurology, HTN, and history of cervical cancer s/p hysterectomy Type of Study: Bedside Swallow Evaluation Previous Swallow Assessment:  (no) Diet Prior to this Study: Thin liquids;Other (Comment) (full liquids) Temperature Spikes Noted: No Respiratory Status: Room air History of Recent Intubation: No Behavior/Cognition: Alert;Cooperative;Pleasant mood;Requires cueing Oral Cavity Assessment: Within Functional Limits Oral Care Completed by SLP: No Oral Cavity - Dentition: Dentures, top;Dentures, bottom Vision: Functional for self-feeding Self-Feeding Abilities: Able to feed self Patient Positioning: Upright in bed Baseline Vocal Quality: Normal Volitional Swallow: Able to elicit    Oral/Motor/Sensory Function Overall Oral Motor/Sensory Function: Within functional limits   Ice Chips Ice chips: Not tested   Thin Liquid Thin Liquid: Within functional limits Presentation: Cup;Straw    Nectar Thick Nectar Thick Liquid: Not tested  Honey Thick Honey Thick Liquid: Not tested   Puree Puree: Within  functional limits   Solid     Solid: Within functional limits      Houston Siren 08/22/2021,9:09 AM

## 2021-08-22 NOTE — Discharge Summary (Signed)
Physician Discharge Summary  Kara Dennis QQP:[id number] DOB: 1940/10/13 DOA: 08/19/2021  PCP: Kara Dennis date: 08/19/2021 Discharge date: 08/22/2021  Admitted From: Home  Discharge disposition: Home   Recommendations for Outpatient Follow-Up:   Follow up with your primary care provider in one week.  Check CBC, BMP, magnesium in the next visit Palliative care has been consulted for outpatient follow-up.  Please consider hospice if patient's condition continues to deteriorate.   Discharge Diagnosis:   Principal Problem:   Syncope Active Problems:   Acute kidney injury superimposed on chronic kidney disease (HCC)   Thyroid nodule   Dementia (HCC)   Prediabetes   Elevated troponin   Hyponatremia   Dehydration   Discharge Condition: Improved.  Diet recommendation:   Regular.  Wound care: None.  Code status: Full.   History of Present Illness:   Kara Dennis is a 81 y.o. female with past medical history of dementia followed by neurology as outpatient, hypertension and history of cervical cancer status post hysterectomy presented to hospital after witnessed syncopal episode.  History was obtained from patient's daughter with the help of interpreter services.  At baseline, patient needs assistance to perform all ADLs and does not speak much.  Patient was recently hospitalized from 5/12-5/13 after having syncopal event and was found to have AKI.  CT head scans negative at that time.  Patient had undergone EEG which was negative as well.  She was supposed to have  30-day event monitor as outpatient.  Patient was then admitted to hospital for further evaluation and treatment.   Hospital Course:   Following conditions were addressed during hospitalization as listed below,  Syncope Patient presented with witnessed syncopal episode at home.  Patient was recently admitted to the hospital for the same and had negative CT scan and CTA, EEG.  She improved  after IV hydration.  Patient was recommended outpatient echocardiogram and possibly 30-day event monitor.  Syncope possibly secondary to volume depletion at this time.  Received IV fluids during hospitalization.  Encouraged oral hydration on discharge.   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  Baseline around 1.2.  Received IV fluids during hospitalization.  Latest creatinine of 1.3.    Elevated troponin Troponin mildly elevated.  EKG normal sinus rhythm.  Likely demand ischemia.  2D echocardiogram with LV ejection fraction of 60 to 65% with no regional wall motion abnormality.  No need for further work-up.   Abnormal urinalysis Urinalysis showed 11-20 WBC.  Urine cultures no growth.  Received 3 days of Rocephin during hospitalization   Possible peripheral arterial disease - ABI w TBI- no evidence of significant disease   Advanced dementia Patient has advanced dementia and is mostly minimally verbal.  Palliative care has recommended outpatient palliative care on discharge   Hyperglycemia prediabetes Latest hemoglobin A1c was 5.9 from 11/2018.  Dietary modification advised.   Hyponatremia Improved after IV fluids.  Sodium prior to discharge was 139   Thyroid nodules TSH within normal limits.  Primary care following as outpatient and has ordered thyroid ultrasound as outpatient.  Disposition.  At this time, patient is stable for disposition home with outpatient PCP follow-up.  Spoke with the patient's daughter at bedside.  Medical Consultants:   Palliative care  Procedures:    None Subjective:   Today, patient was seen and examined at bedside.  Patient's daughter at bedside.  No interval complaints reported.  Discharge Exam:   Vitals:   08/21/21 2008 08/22/21 0417  BP: (!) 134/95 (!) 151/76  Pulse: 100 84  Resp: 15 15  Temp: 97.8 F (36.6 C)   SpO2: 99% 99%   Vitals:   08/21/21 1220 08/21/21  1900 08/21/21 2008 08/22/21 0417  BP:  140/74 (!) 134/95 (!) 151/76  Pulse: 74  100 84  Resp:   15 15  Temp: (!) 97.5 F (36.4 C)  97.8 F (36.6 C)   TempSrc: Oral  Tympanic   SpO2:   99% 99%   General: Alert awake, not in obvious distress HENT: pupils equally reacting to light,  No scleral pallor or icterus noted. Oral mucosa is moist.  Chest:  Clear breath sounds.  Diminished breath sounds bilaterally. No crackles or wheezes.  CVS: S1 &S2 heard. No murmur.  Regular rate and rhythm. Abdomen: Soft, nontender, nondistended.  Bowel sounds are heard.   Extremities: No cyanosis, clubbing or edema.  Peripheral pulses are palpable. Psych: Alert, awake and mildly communicative, language barrier noted. CNS:  No cranial nerve deficits.  Power equal in all extremities.   Skin: Warm and dry.  No rashes noted.  The results of significant diagnostics from this hospitalization (including imaging, microbiology, ancillary and laboratory) are listed below for reference.     Diagnostic Studies:   VAS [place] ABI WITH/WO TBI  Result Date: 08/20/2021  LOWER EXTREMITY DOPPLER STUDY Patient Name:  Kara Dennis  Date of Exam:   08/19/2021 Medical Rec #: [id number]      Accession #:    [id number] Date of Birth: 01/14/1941       Patient Gender: F Patient Age:   80 years Exam Location[facility]  [facility] Procedure:      VAS [place] ABI WITH/WO TBI Referring Phys: Regan Lemming --------------------------------------------------------------------------------  Indications: Dusky feet. Poor capillary refill. Patient admitted with syncope. High Risk Factors: Hypertension. Other Factors: Dementia.  Limitations: Today's exam was limited due to involuntary patient movement,              arrythmia/tachcardia, and Dementia. Comparison Study: No prior study on file Performing Technologist: Sharion Dove RVS  Examination Guidelines: A complete evaluation includes at minimum, Doppler waveform signals and systolic blood pressure reading  at the level of bilateral brachial, anterior tibial, and posterior tibial arteries, when vessel segments are accessible. Bilateral testing is considered an integral part of a complete examination. Photoelectric Plethysmograph (PPG) waveforms and toe systolic pressure readings are included as required and additional duplex testing as needed. Limited examinations for reoccurring indications may be performed as noted.  ABI Findings: +---------+------------------+-----+-----------+--------+ Right    Rt Pressure (mmHg)IndexWaveform   Comment  +---------+------------------+-----+-----------+--------+ Brachial 110                    multiphasic         +---------+------------------+-----+-----------+--------+ PTA      134               1.22 multiphasic         +---------+------------------+-----+-----------+--------+ DP       124               1.13 multiphasic         +---------+------------------+-----+-----------+--------+ Great Toe0                 0.00                     +---------+------------------+-----+-----------+--------+ +---------+------------------+-----+-----------+-------+ Left  Lt Pressure (mmHg)IndexWaveform   Comment +---------+------------------+-----+-----------+-------+ PTA      115               1.05 multiphasic        +---------+------------------+-----+-----------+-------+ DP       130               1.18 multiphasic        +---------+------------------+-----+-----------+-------+ Great Toe36                0.33                    +---------+------------------+-----+-----------+-------+ +-------+-----------+-----------+------------+------------+ ABI/TBIToday's ABIToday's TBIPrevious ABIPrevious TBI +-------+-----------+-----------+------------+------------+ Right  1.22       absent                              +-------+-----------+-----------+------------+------------+ Left   1.18       0.33                                 +-------+-----------+-----------+------------+------------+   Summary: Right: Resting right ankle-brachial index is within normal range. No evidence of significant right lower extremity arterial disease. Unable to attain right toe-brachial index secondary to absent toe pressure. Left: Resting left ankle-brachial index is within normal range. No evidence of significant left lower extremity arterial disease. The left toe-brachial index is abnormal. *See table(s) above for measurements and observations.  Electronically signed by Servando Snare MD on 08/20/2021 at 6:00:22 PM.    Final    ECHOCARDIOGRAM COMPLETE  Result Date: 08/20/2021    ECHOCARDIOGRAM REPORT   Patient Name:   Kara Dennis [facility] Date of Exam: 08/20/2021 Medical Rec #:  [id number]     Height:       61.0 in Accession #:    [id number]    Weight:       110.4 lb Date of Birth:  03-24-1940      BSA:          1.468 m Patient Age:    11 years      BP:           126/68 mmHg Patient Gender: F             HR:           83 bpm. Exam Location:  Inpatient Procedure: 2D Echo, Color Doppler and Cardiac Doppler Indications:    Elevated troponin , Syncope  History:        Patient has prior history of Echocardiogram examinations, most                 recent 12/15/2017. Risk Factors:Hypertension. Dementia.  Sonographer:    Joette Catching RCS Referring Phys: [phone] RONDELL A SMITH  Sonographer Comments: Image acquisition challenging due to uncooperative patient. IMPRESSIONS  1. Mild LVOT outflow obstruction. Left ventricular ejection fraction, by estimation, is 60 to 65%. The left ventricle has normal function. The left ventricle has no regional wall motion abnormalities. There is moderate asymmetric left ventricular hypertrophy. Left ventricular diastolic parameters are consistent with Grade I diastolic dysfunction (impaired relaxation).  2. Right ventricular systolic function is normal. The right ventricular size is normal.  3. A small pericardial effusion is present. There is  no evidence of cardiac tamponade.  4. No evidence of mitral valve regurgitation.  5. There is mild calcification of the aortic valve. Aortic valve  regurgitation is mild. Mild aortic valve stenosis.  6. Aortic no significant ascending aortic disease.  7. The inferior vena cava is normal in size with greater than 50% respiratory variability, suggesting right atrial pressure of 3 mmHg. Comparison(s): No prior Echocardiogram. Conclusion(s)/Recommendation(s): Recommend fluid resuscitation. FINDINGS  Left Ventricle: Mild LVOT outflow obstruction. Left ventricular ejection fraction, by estimation, is 60 to 65%. The left ventricle has normal function. The left ventricle has no regional wall motion abnormalities. The left ventricular internal cavity size was normal in size. There is moderate asymmetric left ventricular hypertrophy. Left ventricular diastolic parameters are consistent with Grade I diastolic dysfunction (impaired relaxation). Right Ventricle: The right ventricular size is normal. Right ventricular systolic function is normal. Left Atrium: Left atrial size was normal in size. Right Atrium: Right atrial size was normal in size. Pericardium: A small pericardial effusion is present. There is no evidence of cardiac tamponade. Mitral Valve: No evidence of mitral valve regurgitation. Tricuspid Valve: Tricuspid valve regurgitation is trivial. Aortic Valve: There is mild calcification of the aortic valve. Aortic valve regurgitation is mild. Aortic regurgitation PHT measures 413 msec. Mild aortic stenosis is present. Aortic valve mean gradient measures 14.0 mmHg. Aortic valve peak gradient measures 29.2 mmHg. Aortic valve area, by VTI measures 1.32 cm. Pulmonic Valve: Pulmonic valve regurgitation is not visualized. Aorta: No significant ascending aortic disease. Venous: The inferior vena cava is normal in size with greater than 50% respiratory variability, suggesting right atrial pressure of 3 mmHg.  LEFT VENTRICLE  PLAX 2D LVIDd:         3.30 cm   Diastology LVIDs:         2.40 cm   LV e' medial:    3.92 cm/s LV PW:         1.30 cm   LV E/e' medial:  12.9 LV IVS:        1.30 cm   LV e' lateral:   3.37 cm/s LVOT diam:     2.00 cm   LV E/e' lateral: 15.0 LV SV:         80 LV SV Index:   55 LVOT Area:     3.14 cm  RIGHT VENTRICLE RV Basal diam:  2.10 cm RV Mid diam:    1.10 cm RV S prime:     9.57 cm/s TAPSE (M-mode): 1.6 cm LEFT ATRIUM             Index        RIGHT ATRIUM          Index LA diam:        2.70 cm 1.84 cm/m   RA Area:     9.29 cm LA Vol (A2C):   16.6 ml 11.31 ml/m  RA Volume:   14.90 ml 10.15 ml/m LA Vol (A4C):   32.2 ml 21.94 ml/m LA Biplane Vol: 23.0 ml 15.67 ml/m  AORTIC VALVE                     PULMONIC VALVE AV Area (Vmax):    1.77 cm      PV Vmax:       1.03 m/s AV Area (Vmean):   2.07 cm      PV Peak grad:  4.2 mmHg AV Area (VTI):     1.32 cm AV Vmax:           270.00 cm/s AV Vmean:          176.000 cm/s AV VTI:  0.607 m AV Peak Grad:      29.2 mmHg AV Mean Grad:      14.0 mmHg LVOT Vmax:         152.00 cm/s LVOT Vmean:        116.000 cm/s LVOT VTI:          0.256 m LVOT/AV VTI ratio: 0.42 AI PHT:            413 msec  AORTA Ao Root diam: 3.30 cm Ao Asc diam:  3.80 cm MITRAL VALVE                TRICUSPID VALVE MV Area (PHT): 19.96 cm    TR Peak grad:   29.6 mmHg MV Decel Time: 38 msec      TR Vmax:        272.00 cm/s MR Peak grad: 88.7 mmHg MR Vmax:      471.00 cm/s   SHUNTS MV E velocity: 50.70 cm/s   Systemic VTI:  0.26 m MV A velocity: 114.00 cm/s  Systemic Diam: 2.00 cm MV E/A ratio:  0.44 Mary Scientist, physiological signed by Phineas Inches Signature Date/Time: 08/20/2021/10:14:20 AM    Final    DG Chest Portable 1 View  Result Date: 08/19/2021 CLINICAL DATA:  Syncope. EXAM: PORTABLE CHEST 1 VIEW COMPARISON:  07/20/2021 FINDINGS: Lungs are hyperexpanded. The lungs are clear without focal pneumonia, edema, pneumothorax or pleural effusion. Cardiopericardial silhouette is at upper  limits of normal for size. Telemetry leads overlie the chest. IMPRESSION: Hyperexpansion without acute cardiopulmonary findings. Electronically Signed   By: Misty Stanley M.D.   On: 08/19/2021 13:47     Labs:   Basic Metabolic Panel: Recent Labs  Lab 08/19/21 1015 08/20/21 0615 08/21/21 0207  NA 134* 138 139  K 5.1 4.8 4.9  CL 102 113* 117*  CO2 21* 17* 19*  GLUCOSE 196* 98 105*  BUN 52* 36* 26*  CREATININE 2.02* 1.58* 1.32*  CALCIUM 10.0 8.8* 9.0   GFR CrCl cannot be calculated (Unknown ideal weight.). Liver Function Tests: No results for input(s): "AST", "ALT", "ALKPHOS", "BILITOT", "PROT", "ALBUMIN" in the last 168 hours. No results for input(s): "LIPASE", "AMYLASE" in the last 168 hours. No results for input(s): "AMMONIA" in the last 168 hours. Coagulation profile No results for input(s): "INR", "PROTIME" in the last 168 hours.  CBC: Recent Labs  Lab 08/19/21 1015 08/20/21 0615  WBC 7.5 4.9  HGB 14.8 11.8*  HCT 42.4 34.2*  MCV 94.0 94.2  PLT 258 191   Cardiac Enzymes: No results for input(s): "CKTOTAL", "CKMB", "CKMBINDEX", "TROPONINI" in the last 168 hours. BNP: Invalid input(s): "POCBNP" CBG: Recent Labs  Lab 08/19/21 1303  GLUCAP 123*   D-Dimer Recent Labs    08/19/21 1243  DDIMER 0.63*   Hgb A1c No results for input(s): "HGBA1C" in the last 72 hours. Lipid Profile No results for input(s): "CHOL", "HDL", "LDLCALC", "TRIG", "CHOLHDL", "LDLDIRECT" in the last 72 hours. Thyroid function studies No results for input(s): "TSH", "T4TOTAL", "T3FREE", "THYROIDAB" in the last 72 hours.  Invalid input(s): "FREET3" Anemia work up No results for input(s): "VITAMINB12", "FOLATE", "FERRITIN", "TIBC", "IRON", "RETICCTPCT" in the last 72 hours. Microbiology Recent Results (from the past 240 hour(s))  Urine Culture     Status: None   Collection Time: 08/19/21 10:15 AM   Specimen: Urine, Clean Catch  Result Value Ref Range Status   Specimen Description  URINE, CLEAN CATCH  Final   Special Requests NONE  Final  Culture   Final    NO GROWTH Performed at [facility], [place] [address], [place], [place] [address]    Report Status 08/21/2021 FINAL  Final     Discharge Instructions:   Discharge Instructions     Diet general   Complete by: As directed    Discharge instructions   Complete by: As directed    Follow-up with your primary care provider in 1 week.  Increase the fluid intake.  Follow-up with palliative care as outpatient.  Seek medical attention for worsening symptoms.   Increase activity slowly   Complete by: As directed       Allergies as of 08/22/2021       Reactions   Hydrochlorothiazide    Feet Swelling        Medication List     TAKE these medications    alendronate 70 MG tablet Commonly known as: FOSAMAX Take 70 mg by mouth once a week.   Vitamin D (Ergocalciferol) 1.25 MG (50000 UNIT) Caps capsule Commonly known as: DRISDOL Take 1 capsule (50,000 Units total) by mouth every 7 (seven) days.        Follow-up [place]. Follow up.   Contact information: [place] [address] [address]. Follow up.   Contact information: [place] [address] [phone]                 Time coordinating discharge: 39 minutes  Signed:  Sameul Tagle  [facility] 08/22/2021, 9:22 AM

## 2021-08-22 NOTE — TOC Transition Note (Addendum)
Transition of Care [facility]) - CM/SW Discharge Note   Patient Details  Name: Kara Dennis MRN: [id number] Date of Birth: [birth year]  [facility] CM/SW Contact:  Sharin Mons, RN Phone Number: 08/22/2021, 10:40 AM   Clinical Narrative:    Patient will DC to: home Anticipated DC date: 08/22/2021 Family notified: yes Transport by: car  Admitted with syncope event, AKI/dehydration. From home with family. PTA independent with ADL'S. Per MD patient ready for DC today. RN, patient, patient's family notified of DC. MD requested outpatient palliative care. NCM spoke with daughter "My" regarding matter and daughter agreeable to services. No preference for provider.Referral made with [facility] for outpatient palliative care. Pt without DME needs. Post hospital f/u noted on AVS. Daughter without Rx med concerns. Son in law to provide transportation to home.  My- Deborah Chalk ( daughter) [phone]   RNCM will sign off for now as intervention is no longer needed. Please consult [place] again if new needs arise.     Final next level of care: Home/Self Care Barriers to Discharge: No Barriers Identified   Patient Goals and CMS Choice        Discharge Placement                       Discharge Plan and Services                                     Social Determinants of Health (SDOH) Interventions     Readmission Risk Interventions     No data to display

## 2022-04-23 ENCOUNTER — Inpatient Hospital Stay (HOSPITAL_COMMUNITY)
Admission: EM | Admit: 2022-04-23 | Discharge: 2022-04-25 | DRG: 178 | Disposition: A | Discharge status: Home / Self Care | Payer: Medicare Other | Attending: Internal Medicine | Admitting: Internal Medicine

## 2022-04-23 ENCOUNTER — Emergency Department (HOSPITAL_COMMUNITY): Payer: Medicare Other

## 2022-04-23 ENCOUNTER — Encounter (HOSPITAL_COMMUNITY): Payer: Self-pay | Admitting: Emergency Medicine

## 2022-04-23 DIAGNOSIS — U071 COVID-19: Principal | ICD-10-CM | POA: Diagnosis present

## 2022-04-23 DIAGNOSIS — Z888 Allergy status to other drugs, medicaments and biological substances status: Secondary | ICD-10-CM | POA: Diagnosis not present

## 2022-04-23 DIAGNOSIS — I5032 Chronic diastolic (congestive) heart failure: Secondary | ICD-10-CM | POA: Diagnosis present

## 2022-04-23 DIAGNOSIS — R55 Syncope and collapse: Secondary | ICD-10-CM | POA: Diagnosis present

## 2022-04-23 DIAGNOSIS — E041 Nontoxic single thyroid nodule: Secondary | ICD-10-CM | POA: Diagnosis present

## 2022-04-23 DIAGNOSIS — Z8541 Personal history of malignant neoplasm of cervix uteri: Secondary | ICD-10-CM | POA: Diagnosis not present

## 2022-04-23 DIAGNOSIS — R262 Difficulty in walking, not elsewhere classified: Secondary | ICD-10-CM | POA: Diagnosis present

## 2022-04-23 DIAGNOSIS — F0390 Unspecified dementia without behavioral disturbance: Secondary | ICD-10-CM | POA: Diagnosis present

## 2022-04-23 DIAGNOSIS — I7121 Aneurysm of the ascending aorta, without rupture: Secondary | ICD-10-CM | POA: Diagnosis present

## 2022-04-23 DIAGNOSIS — I10 Essential (primary) hypertension: Secondary | ICD-10-CM | POA: Diagnosis not present

## 2022-04-23 DIAGNOSIS — R051 Acute cough: Secondary | ICD-10-CM | POA: Diagnosis not present

## 2022-04-23 DIAGNOSIS — E860 Dehydration: Secondary | ICD-10-CM | POA: Diagnosis present

## 2022-04-23 DIAGNOSIS — R5381 Other malaise: Secondary | ICD-10-CM | POA: Diagnosis present

## 2022-04-23 DIAGNOSIS — Z833 Family history of diabetes mellitus: Secondary | ICD-10-CM | POA: Diagnosis not present

## 2022-04-23 DIAGNOSIS — I110 Hypertensive heart disease with heart failure: Secondary | ICD-10-CM | POA: Diagnosis present

## 2022-04-23 DIAGNOSIS — T80818A Extravasation of other vesicant agent, initial encounter: Secondary | ICD-10-CM

## 2022-04-23 DIAGNOSIS — Z7983 Long term (current) use of bisphosphonates: Secondary | ICD-10-CM

## 2022-04-23 DIAGNOSIS — R627 Adult failure to thrive: Secondary | ICD-10-CM

## 2022-04-23 DIAGNOSIS — R059 Cough, unspecified: Secondary | ICD-10-CM | POA: Diagnosis present

## 2022-04-23 LAB — COMPREHENSIVE METABOLIC PANEL
ALT: 12 U/L (ref 0–44)
AST: 25 U/L (ref 15–41)
Albumin: 3.4 g/dL — ABNORMAL LOW (ref 3.5–5.0)
Alkaline Phosphatase: 60 U/L (ref 38–126)
Anion gap: 10 (ref 5–15)
BUN: 15 mg/dL (ref 8–23)
CO2: 22 mmol/L (ref 22–32)
Calcium: 9 mg/dL (ref 8.9–10.3)
Chloride: 106 mmol/L (ref 98–111)
Creatinine, Ser: 1.22 mg/dL — ABNORMAL HIGH (ref 0.44–1.00)
GFR, Estimated: 45 mL/min — ABNORMAL LOW (ref >60)
Glucose, Bld: 155 mg/dL — ABNORMAL HIGH (ref 70–99)
Potassium: 3.8 mmol/L (ref 3.5–5.1)
Sodium: 138 mmol/L (ref 135–145)
Total Bilirubin: 0.5 mg/dL (ref 0.3–1.2)
Total Protein: 6.6 g/dL (ref 6.5–8.1)

## 2022-04-23 LAB — MAGNESIUM: Magnesium: 1.8 mg/dL (ref 1.7–2.4)

## 2022-04-23 LAB — URINALYSIS, ROUTINE W REFLEX MICROSCOPIC
Bilirubin Urine: NEGATIVE
Glucose, UA: NEGATIVE mg/dL
Ketones, ur: NEGATIVE mg/dL
Leukocytes,Ua: NEGATIVE
Nitrite: NEGATIVE
Protein, ur: NEGATIVE mg/dL
Specific Gravity, Urine: 1.01 (ref 1.005–1.030)
pH: 7 (ref 5.0–8.0)

## 2022-04-23 LAB — CBC WITH DIFFERENTIAL/PLATELET
Abs Immature Granulocytes: 0.02 10*3/uL (ref 0.00–0.07)
Basophils Absolute: 0 10*3/uL (ref 0.0–0.1)
Basophils Relative: 0 %
Eosinophils Absolute: 0 10*3/uL (ref 0.0–0.5)
Eosinophils Relative: 0 %
HCT: 35.3 % — ABNORMAL LOW (ref 36.0–46.0)
Hemoglobin: 12.2 g/dL (ref 12.0–15.0)
Immature Granulocytes: 0 %
Lymphocytes Relative: 8 %
Lymphs Abs: 0.6 10*3/uL — ABNORMAL LOW (ref 0.7–4.0)
MCH: 30.9 pg (ref 26.0–34.0)
MCHC: 34.6 g/dL (ref 30.0–36.0)
MCV: 89.4 fL (ref 80.0–100.0)
Monocytes Absolute: 0.4 10*3/uL (ref 0.1–1.0)
Monocytes Relative: 5 %
Neutro Abs: 6.4 10*3/uL (ref 1.7–7.7)
Neutrophils Relative %: 87 %
Platelets: 203 10*3/uL (ref 150–400)
RBC: 3.95 MIL/uL (ref 3.87–5.11)
RDW: 12.8 % (ref 11.5–15.5)
WBC: 7.4 10*3/uL (ref 4.0–10.5)
nRBC: 0 % (ref 0.0–0.2)

## 2022-04-23 LAB — URINALYSIS, MICROSCOPIC (REFLEX)

## 2022-04-23 LAB — RAPID URINE DRUG SCREEN, HOSP PERFORMED
Amphetamines: NOT DETECTED
Barbiturates: NOT DETECTED
Benzodiazepines: NOT DETECTED
Cocaine: NOT DETECTED
Opiates: NOT DETECTED
Tetrahydrocannabinol: NOT DETECTED

## 2022-04-23 LAB — RESP PANEL BY RT-PCR (RSV, FLU A&B, COVID)  RVPGX2
Influenza A by PCR: NEGATIVE
Influenza B by PCR: NEGATIVE
Resp Syncytial Virus by PCR: NEGATIVE
SARS Coronavirus 2 by RT PCR: POSITIVE — AB

## 2022-04-23 LAB — CK: Total CK: 112 U/L (ref 38–234)

## 2022-04-23 LAB — PROCALCITONIN: Procalcitonin: 0.19 ng/mL

## 2022-04-23 MED ORDER — ACETAMINOPHEN 325 MG PO TABS: 650.0000 mg | ORAL_TABLET | Freq: Four times a day (QID) | ORAL | Status: DC | PRN

## 2022-04-23 MED ORDER — DIAZEPAM 5 MG/ML IJ SOLN
2.5000 mg | Freq: Once | INTRAMUSCULAR | Status: AC
  Administered 2022-04-23: 2.5 mg via INTRAVENOUS
  Filled 2022-04-23: qty 2

## 2022-04-23 MED ORDER — SODIUM CHLORIDE 0.9 % IV BOLUS
1000.0000 mL | Freq: Once | INTRAVENOUS | Status: AC
  Administered 2022-04-23: 1000 mL via INTRAVENOUS

## 2022-04-23 MED ORDER — NIRMATRELVIR/RITONAVIR (PAXLOVID) TABLET (RENAL DOSING)
2.0000 | ORAL_TABLET | Freq: Two times a day (BID) | ORAL | Status: DC
  Administered 2022-04-23 – 2022-04-25 (×3): 2 via ORAL
  Filled 2022-04-23: qty 20

## 2022-04-23 MED ORDER — MELATONIN 3 MG PO TABS: 3.0000 mg | ORAL_TABLET | Freq: Every evening | ORAL | Status: DC | PRN

## 2022-04-23 MED ORDER — NIRMATRELVIR/RITONAVIR (PAXLOVID)TABLET: 3.0000 | ORAL_TABLET | Freq: Two times a day (BID) | ORAL | Status: DC

## 2022-04-23 MED ORDER — ALBUTEROL SULFATE (2.5 MG/3ML) 0.083% IN NEBU: 2.5000 mg | INHALATION_SOLUTION | RESPIRATORY_TRACT | Status: DC | PRN

## 2022-04-23 MED ORDER — IOHEXOL 350 MG/ML SOLN
60.0000 mL | Freq: Once | INTRAVENOUS | Status: AC | PRN
  Administered 2022-04-23: 60 mL via INTRAVENOUS

## 2022-04-23 MED ORDER — BENZONATATE 100 MG PO CAPS: 200.0000 mg | ORAL_CAPSULE | Freq: Three times a day (TID) | ORAL | Status: DC | PRN

## 2022-04-23 MED ORDER — ACETAMINOPHEN 650 MG RE SUPP
650.0000 mg | Freq: Four times a day (QID) | RECTAL | Status: DC | PRN
  Administered 2022-04-24: 650 mg via RECTAL
  Filled 2022-04-23: qty 1

## 2022-04-23 NOTE — ED Provider Notes (Signed)
[facility] Provider Note  CSN: [id number] Arrival date & time: 04/23/22 1403  Chief Complaint(s) Seizures  HPI Kara Dennis is a 82 y.o. female with past medical history as below, significant for dementia, hypertension not on medications who presents to the ED with complaint of weakness, cough.  Patient with very limited speech at baseline, does not speak [place].  Accompanied by daughter at bedside who has limited [place] interpreter was utilized.  Daughter reports patient has had progressive weakness over the past few weeks, she slept in very late this morning and she had to be essentially carried to the table to eat lunch, she ate only a few bites of her meal and then slumped over/leaning back in her chair and had possible brief LOC.  Did not fall to the ground.  No seizure activity was observed by family.  Family also reports cough over the past 24 to 48 hours, nonproductive.  No fevers or chills, no nausea or vomiting.  No change in bowel or bladder function, she wears a brief period does report worsening low back pain over the past few weeks. No falls reported. Had a seizure long ago in [place] but does not take medications for it. Only medication is a vitamin D tablet.   Past Medical History Past Medical History:  Diagnosis Date   Bradycardia 2014    identified before hysterectomy, treated for short time with medication before during and after hysterectomy    Cervical cancer ([place]) 2014    s/p hysterectomy, no chemo or radiation    Dementia ([place])    HTN (hypertension) 2013   took some medicine in [place]    Patient Active Problem List   Diagnosis Date Noted   Dehydration 08/20/2021   Elevated troponin 08/19/2021   Hyponatremia 08/19/2021   Syncope 07/20/2021   Acute kidney injury superimposed on chronic kidney disease ([place]) 07/20/2021   Thyroid nodule 07/20/2021   Syncope due to orthostatic hypotension 12/15/2017   Prediabetes  06/30/2016   Neck mass 04/17/2016   High cholesterol 01/31/2014   History of cervical cancer 01/28/2014   Dementia ([place]) 01/28/2014   HTN (hypertension)    Home Medication(s) Prior to Admission medications   Medication Sig Start Date End Date Taking? Authorizing Provider  alendronate (FOSAMAX) 70 MG tablet Take 70 mg by mouth once a week. 05/04/21   [provider]  Vitamin D, Ergocalciferol, (DRISDOL) 1.25 MG (50000 UT) CAPS capsule Take 1 capsule (50,000 Units total) by mouth every 7 (seven) days. Patient not taking: Reported on 07/20/2021 11/24/18   Antony Blackbird, MD                                                                                                                                    Past Surgical History Past Surgical History:  Procedure Laterality Date   ABDOMINAL HYSTERECTOMY  05/2012    Family History Family  History  Problem Relation Age of Onset   Diabetes Brother    Heart disease Neg Hx    Cancer Neg Hx    Dementia Neg Hx    Breast cancer Neg Hx     Social History Social History   Tobacco Use   Smoking status: Never   Smokeless tobacco: Never  Vaping Use   Vaping Use: Never used  Substance Use Topics   Alcohol use: No    Alcohol/week: 0.0 standard drinks of alcohol   Drug use: No   Allergies Hydrochlorothiazide  Review of Systems Review of Systems  Unable to perform ROS: Dementia  Constitutional:  Negative for chills and fever.  Respiratory:  Positive for cough. Negative for shortness of breath.   Cardiovascular:  Negative for chest pain and palpitations.  Gastrointestinal:  Negative for abdominal distention, diarrhea, nausea and vomiting.  Musculoskeletal:  Positive for back pain.    Physical Exam Vital Signs  I have reviewed the triage vital signs BP 135/61 (BP Location: Right Arm)   Pulse 95   Temp 98.4 F (36.9 C) (Axillary)   Resp 17   SpO2 100%  Physical Exam Vitals and nursing note reviewed.  Constitutional:       General: She is not in acute distress.    Appearance: Normal appearance.  HENT:     Head: Normocephalic and atraumatic.     Right Ear: External ear normal.     Left Ear: External ear normal.     Nose: Nose normal.     Mouth/Throat:     Mouth: Mucous membranes are dry.  Eyes:     General: No scleral icterus.       Right eye: No discharge.        Left eye: No discharge.  Cardiovascular:     Rate and Rhythm: Normal rate and regular rhythm.     Pulses: Normal pulses.     Heart sounds: Normal heart sounds.  Pulmonary:     Effort: Pulmonary effort is normal. No respiratory distress.     Breath sounds: Normal breath sounds.  Abdominal:     General: Abdomen is flat.     Palpations: Abdomen is soft.     Tenderness: There is no abdominal tenderness. There is no guarding or rebound.     Comments: Abdomen is soft, not peritoneal   Musculoskeletal:       Arms:     Cervical back: No rigidity.     Right lower leg: No edema.     Left lower leg: No edema.  Skin:    General: Skin is warm and dry.     Capillary Refill: Capillary refill takes less than 2 seconds.  Neurological:     Mental Status: She is alert. Mental status is at baseline.     Cranial Nerves: No facial asymmetry.     Motor: No tremor.     Comments: Moving her extremities spontaneously, Responds to noxius stimulus to all 4 extremities Extremities and eyes will cross midline Appropriate eye contact/ tracking wnl   Psychiatric:        Mood and Affect: Mood normal.        Behavior: Behavior normal.     ED Results and Treatments Labs (all labs ordered are listed, but only abnormal results are displayed) Labs Reviewed  RESP PANEL BY RT-PCR (RSV, FLU A&B, COVID)  RVPGX2 - Abnormal; Notable for the following components:      Result Value   SARS Coronavirus 2 by  RT PCR POSITIVE (*)    All other components within normal limits  COMPREHENSIVE METABOLIC PANEL - Abnormal; Notable for the following components:   Glucose, Bld  155 (*)    Creatinine, Ser 1.22 (*)    Albumin 3.4 (*)    GFR, Estimated 45 (*)    All other components within normal limits  CBC WITH DIFFERENTIAL/PLATELET - Abnormal; Notable for the following components:   HCT 35.3 (*)    Lymphs Abs 0.6 (*)    All other components within normal limits  URINALYSIS, ROUTINE W REFLEX MICROSCOPIC - Abnormal; Notable for the following components:   Hgb urine dipstick SMALL (*)    All other components within normal limits  URINALYSIS, MICROSCOPIC (REFLEX) - Abnormal; Notable for the following components:   Bacteria, UA RARE (*)    All other components within normal limits  CK  RAPID URINE DRUG SCREEN, HOSP PERFORMED  MAGNESIUM  ETHANOL                                                                                                                          Radiology No results found.  Pertinent labs & imaging results that were available during my care of the patient were reviewed by me and considered in my medical decision making (see MDM for details).  Medications Ordered in ED Medications - No data to display                                                                                                                                   Procedures Procedures  (including critical care time)  Medical Decision Making / ED Course    Medical Decision Making:    Kara Dennis is a 82 y.o. female hx htn, ?prior seizure, no medications to ED 2/2 weakness and cough. The complaint involves an extensive differential diagnosis and also carries with it a high risk of complications and morbidity.  Serious etiology was considered. Ddx includes but is not limited to: Differential diagnoses for altered mental status includes but is not exclusive to alcohol, illicit or prescription medications, intracranial pathology such as stroke, intracerebral hemorrhage, fever or infectious causes including sepsis, hypoxemia, uremia, trauma, endocrine related disorders such as  diabetes, hypoglycemia, thyroid-related diseases, etc.   Complete initial physical exam performed, notably the patient  was lying on stretcher, HDS, will follow commands intermittently.    Reviewed and confirmed  nursing documentation for past medical history, family history, social history.  Vital signs reviewed.    Translator used for encounter  Pt with cough x1-2 days, not productive. She has had progressive weakness x2-3 weeks w/ low back pain, body aches. She has dementia but is moving all 4 extremities on my assessment, will follow some commands through translator but given her underlying dementia it is difficult to obtain full neuro exam. Labs reviewed so far are stable, Cr similar to prior. CBC was okay. CXR was wnl, EKG was w/o stemi  Pt with progressive weakness, does not appear to be acute but has worsened in last 24 hours per family at bedside. She is pending imaging and remainder of labs at shift change. Signed out to incoming EDP pending remainder of workup. She is HDS at handoff.       Additional history obtained: -Additional history obtained from family -External records from outside source obtained and reviewed including: Chart review including previous notes, labs, imaging, consultation notes including prior admission, prior ed visits, prior labs/imaging   Lab Tests: -I ordered, reviewed, and interpreted labs.   The pertinent results include:   Labs Reviewed  COMPREHENSIVE METABOLIC PANEL  CBC WITH DIFFERENTIAL/PLATELET  ETHANOL  URINALYSIS, ROUTINE W REFLEX MICROSCOPIC    Notable for as above  EKG   EKG Interpretation  Date/Time:    Ventricular Rate:    PR Interval:    QRS Duration:   QT Interval:    QTC Calculation:   R Axis:     Text Interpretation:           Imaging Studies ordered: I ordered imaging studies including CTH C/s and CTAP, CXR I independently visualized the following imaging with scope of interpretation limited to determining acute  life threatening conditions related to emergency care: PENDING I independently visualized and interpreted imaging. I agree with the radiologist interpretation   Medicines ordered and prescription drug management: No orders of the defined types were placed in this encounter.   -I have reviewed the patients home medicines and have made adjustments as needed   Consultations Obtained: na   Cardiac Monitoring: The patient was maintained on a cardiac monitor.  I personally viewed and interpreted the cardiac monitored which showed an underlying rhythm of: NSR  Social Determinants of Health:  Diagnosis or treatment significantly limited by social determinants of health: non english speaking   Reevaluation: After the interventions noted above, I reevaluated the patient and found that they have stayed the same  Co morbidities that complicate the patient evaluation  Past Medical History:  Diagnosis Date   Bradycardia 2014    identified before hysterectomy, treated for short time with medication before during and after hysterectomy    Cervical cancer ([place]) 2014    s/p hysterectomy, no chemo or radiation    Dementia ([place])    HTN (hypertension) 2013   took some medicine in [place]       Dispostion: Disposition decision including need for hospitalization was considered, and patient pending at shift change   Final Clinical Impression(s) / ED Diagnoses Final diagnoses:  None     This chart was dictated using voice recognition software.  Despite best efforts to proofread,  errors can occur which can change the documentation meaning.    Jeanell Sparrow, DO 04/23/22 2032

## 2022-04-23 NOTE — H&P (Signed)
History and Physical      Kara Dennis [id number] DOB: 1940-05-19 DOA: 04/23/2022  PCP: [place]. *** Patient coming from: home ***  I have personally briefly reviewed patient's old medical records in [place]  Chief Complaint: ***  HPI: Kara Dennis is a 82 y.o. female with medical history significant for *** who is admitted to [facility] on 04/23/2022 with *** after presenting from home*** to [facility] ED complaining of ***.    ***       ***   ED Course:  Vital signs in the ED were notable for the following: ***  Labs were notable for the following: ***  Per my interpretation, EKG in ED demonstrated the following:  ***  Imaging and additional notable ED work-up: ***  While in the ED, the following were administered: ***  Subsequently, the patient was admitted  ***  ***red    Review of Systems: As per HPI otherwise 10 point review of systems negative.   Past Medical History:  Diagnosis Date   Bradycardia 2014    identified before hysterectomy, treated for short time with medication before during and after hysterectomy    Cervical cancer ([place]) 2014    s/p hysterectomy, no chemo or radiation    Dementia ([place])    HTN (hypertension) 2013   took some medicine in [place]     Past Surgical History:  Procedure Laterality Date   ABDOMINAL HYSTERECTOMY  05/2012     Social History:  reports that she has never smoked. She has never used smokeless tobacco. She reports that she does not drink alcohol and does not use drugs.   Allergies  Allergen Reactions   Hydrochlorothiazide     Feet Swelling    Family History  Problem Relation Age of Onset   Diabetes Brother    Heart disease Neg Hx    Cancer Neg Hx    Dementia Neg Hx    Breast cancer Neg Hx     Family history reviewed and not pertinent ***   Prior to Admission medications   Medication Sig Start Date End Date Taking? Authorizing Provider  alendronate (FOSAMAX) 70  MG tablet Take 70 mg by mouth once a week. 05/04/21   [provider]  Vitamin D, Ergocalciferol, (DRISDOL) 1.25 MG (50000 UT) CAPS capsule Take 1 capsule (50,000 Units total) by mouth every 7 (seven) days. Patient not taking: Reported on 07/20/2021 11/24/18   Antony Blackbird, MD     Objective    Physical Exam: Vitals:   04/23/22 1730 04/23/22 1808 04/23/22 1950 04/23/22 2038  BP: 136/65   130/66  Pulse: (!) 105   (!) 110  Resp: 17   17  Temp:  98.8 F (37.1 C)  98.7 F (37.1 C)  TempSrc:  Oral  Oral  SpO2: 100%   100%  Weight:   58.5 kg   Height:   5' (1.524 m)     General: appears to be stated age; alert, oriented Skin: warm, dry, no rash Head:  AT/[place] Mouth:  Oral mucosa membranes appear moist, normal dentition Neck: supple; trachea midline Heart:  RRR; did not appreciate any M/R/G Lungs: CTAB, did not appreciate any wheezes, rales, or rhonchi Abdomen: + BS; soft, ND, NT Vascular: 2+ pedal pulses b/l; 2+ radial pulses b/l Extremities: no peripheral edema, no muscle wasting Neuro: strength and sensation intact in upper and lower extremities b/l ***   *** Neuro: 5/5 strength of the proximal and  distal flexors and extensors of the upper and lower extremities bilaterally; sensation intact in upper and lower extremities b/l; cranial nerves II through XII grossly intact; no pronator drift; no evidence suggestive of slurred speech, dysarthria, or facial droop; Normal muscle tone. No tremors.  *** Neuro: In the setting of the patient's current mental status and associated inability to follow instructions, unable to perform full neurologic exam at this time.  As such, assessment of strength, sensation, and cranial nerves is limited at this time. Patient noted to spontaneously move all 4 extremities. No tremors.  ***    Labs on Admission: I have personally reviewed following labs and imaging studies  CBC: Recent Labs  Lab 04/23/22 1424  WBC 7.4  NEUTROABS 6.4  HGB  12.2  HCT 35.3*  MCV 89.4  PLT [id number]   Basic Metabolic Panel: Recent Labs  Lab 04/23/22 1424 04/23/22 1459  NA 138  --   K 3.8  --   CL 106  --   CO2 22  --   GLUCOSE 155*  --   BUN 15  --   CREATININE 1.22*  --   CALCIUM 9.0  --   MG  --  1.8   GFR: Estimated Creatinine Clearance: 28.9 mL/min (A) (by C-G formula based on SCr of 1.22 mg/dL (H)). Liver Function Tests: Recent Labs  Lab 04/23/22 1424  AST 25  ALT 12  ALKPHOS 60  BILITOT 0.5  PROT 6.6  ALBUMIN 3.4*   No results for input(s): "LIPASE", "AMYLASE" in the last 168 hours. No results for input(s): "AMMONIA" in the last 168 hours. Coagulation Profile: No results for input(s): "INR", "PROTIME" in the last 168 hours. Cardiac Enzymes: Recent Labs  Lab 04/23/22 1459  CKTOTAL 112   BNP (last 3 results) No results for input(s): "PROBNP" in the last 8760 hours. HbA1C: No results for input(s): "HGBA1C" in the last 72 hours. CBG: No results for input(s): "GLUCAP" in the last 168 hours. Lipid Profile: No results for input(s): "CHOL", "HDL", "LDLCALC", "TRIG", "CHOLHDL", "LDLDIRECT" in the last 72 hours. Thyroid Function Tests: No results for input(s): "TSH", "T4TOTAL", "FREET4", "T3FREE", "THYROIDAB" in the last 72 hours. Anemia Panel: No results for input(s): "VITAMINB12", "FOLATE", "FERRITIN", "TIBC", "IRON", "RETICCTPCT" in the last 72 hours. Urine analysis:    Component Value Date/Time   COLORURINE YELLOW 04/23/2022 1424   APPEARANCEUR CLEAR 04/23/2022 1424   LABSPEC 1.010 04/23/2022 1424   PHURINE 7.0 04/23/2022 1424   GLUCOSEU NEGATIVE 04/23/2022 1424   HGBUR SMALL (A) 04/23/2022 1424   BILIRUBINUR NEGATIVE 04/23/2022 1424   BILIRUBINUR negative 05/22/2016 0932   KETONESUR NEGATIVE 04/23/2022 1424   PROTEINUR NEGATIVE 04/23/2022 1424   UROBILINOGEN 0.2 05/22/2016 0932   NITRITE NEGATIVE 04/23/2022 1424   LEUKOCYTESUR NEGATIVE 04/23/2022 1424    Radiological Exams on Admission: CT L-SPINE NO  CHARGE  Result Date: 04/23/2022 CLINICAL DATA:  Seizure.  Back pain. EXAM: CT LUMBAR SPINE WITHOUT CONTRAST TECHNIQUE: Multidetector CT imaging of the lumbar spine was performed without intravenous contrast administration. Multiplanar CT image reconstructions were also generated. RADIATION DOSE REDUCTION: This exam was performed according to the departmental dose-optimization program which includes automated exposure control, adjustment of the mA and/or kV according to patient size and/or use of iterative reconstruction technique. COMPARISON:  Lateral chest radiograph 06/05/2016 FINDINGS: Segmentation: 5 lumbar type vertebral bodies. Alignment: Thoracolumbar curvature convex to the right and lower lumbar curvature convex to the left. Vertebrae: Compression deformity at T12 that I think it is probably old.  Loss of height anteriorly of 40%. No retropulsed bone. No other regional bone injury. Paraspinal and other soft tissues: Negative other than aortic atherosclerosis. Disc levels: No significant disc level pathology at L1-2 or above. L2-3: Endplate osteophytes and bulging of the disc more prominent towards the left. Narrowing of the left lateral recess and intervertebral foramen on the left that could cause left-sided neural compression. L3-4: Bulging of the disc. Mild narrowing of both lateral recesses without definite neural compression. L4-5: Bulging of the disc. Mild narrowing of the lateral recesses without likely neural compression. L5-S1: No disc abnormality.  Mild facet osteoarthritis.  No stenosis IMPRESSION: 1. Probably old compression fracture at T12 with loss of height anteriorly of 40%. No retropulsed bone. 2. L2-3: Endplate osteophytes and bulging of the disc more prominent towards the left. Narrowing of the left lateral recess and intervertebral foramen on the left that could cause left-sided neural compression. 3. L3-4: Bulging of the disc. Mild narrowing of both lateral recesses without likely  neural compression. 4. L4-5: Bulging of the disc. Mild narrowing of the lateral recesses without likely neural compression. 5. L5-S1: No disc abnormality. Mild facet osteoarthritis. No stenosis. 6. Aortic atherosclerosis. Aortic Atherosclerosis (ICD10-I70.0). Electronically Signed   By: Nelson Chimes M.D.   On: 04/23/2022 20:52   CT CHEST ABDOMEN PELVIS WO CONTRAST  Result Date: 04/23/2022 CLINICAL DATA:  Seizure.  Abdominal pain. EXAM: CT CHEST, ABDOMEN AND PELVIS WITHOUT CONTRAST TECHNIQUE: Multidetector CT imaging of the chest, abdomen and pelvis was performed following the standard protocol without IV contrast. RADIATION DOSE REDUCTION: This exam was performed according to the departmental dose-optimization program which includes automated exposure control, adjustment of the mA and/or kV according to patient size and/or use of iterative reconstruction technique. COMPARISON:  None Available. FINDINGS: CT CHEST FINDINGS Cardiovascular: There is mild aneurysmal dilatation of the ascending aorta measuring 4 cm. Heart is normal in size. There is no pericardial effusion. There are atherosclerotic calcifications of the aorta. Mediastinum/Nodes: There is a hypodense left thyroid nodule measuring 8 mm. Bilateral thyroid calcifications are present. There are no enlarged mediastinal or hilar lymph nodes. There is a small hiatal hernia. The esophagus is within normal limits as visualized. Lungs/Pleura: There is some linear scarring or atelectasis in the left lung apex. Mild emphysematous changes are present. Lungs are otherwise clear. There is no pleural effusion or pneumothorax. Musculoskeletal: There is moderate chronic compression deformity of T12. No acute fractures are seen. CT ABDOMEN PELVIS FINDINGS Hepatobiliary: Small gallstones are present. There is no biliary ductal dilatation. No focal liver lesions are seen. Pancreas: Unremarkable. No pancreatic ductal dilatation or surrounding inflammatory changes. Spleen:  Normal in size without focal abnormality. Adrenals/Urinary Tract: There is an air-fluid level in the bladder. Kidneys and adrenal glands are within normal limits. Stomach/Bowel: Stomach is within normal limits. Appendix appears normal. No evidence of bowel wall thickening, distention, or inflammatory changes. Vascular/Lymphatic: Aortic atherosclerosis. No enlarged abdominal or pelvic lymph nodes. Reproductive: Status post hysterectomy. No adnexal masses. Other: No abdominal wall hernia or abnormality. No abdominopelvic ascites. Musculoskeletal: No acute osseous abnormality. Extravasated contrast noted in the right forearm. IMPRESSION: 1. Air-fluid level in the bladder. Correlate clinically for cystitis. 2. No acute localizing process in the chest, abdomen or pelvis. 3. Cholelithiasis. 4. Ascending aortic aneurysm measuring 4 cm. Recommend annual imaging followup by CTA or MRA. This recommendation follows 2010 ACCF/AHA/AATS/ACR/ASA/SCA/SCAI/SIR/STS/SVM Guidelines for the Diagnosis and Management of Patients with Thoracic Aortic Disease. Circulation. 2010; [id number]. Aortic aneurysm NOS (ICD10-I71.9) 5.  Subcentimeter incidental left thyroid nodule. No follow-up imaging is recommended. Reference: J Am Coll Radiol. 2015 Feb;12(2): 143-50 6. Extravasated contrast in the right forearm. Aortic Atherosclerosis (ICD10-I70.0). Electronically Signed   By: Ronney Asters M.D.   On: 04/23/2022 19:54   CT Cervical Spine Wo Contrast  Result Date: 04/23/2022 CLINICAL DATA:  Seizure.  Neck trauma. EXAM: CT CERVICAL SPINE WITHOUT CONTRAST TECHNIQUE: Multidetector CT imaging of the cervical spine was performed without intravenous contrast. Multiplanar CT image reconstructions were also generated. RADIATION DOSE REDUCTION: This exam was performed according to the departmental dose-optimization program which includes automated exposure control, adjustment of the mA and/or kV according to patient size and/or use of iterative  reconstruction technique. COMPARISON:  07/20/2021 FINDINGS: Alignment: No malalignment. Skull base and vertebrae: No fracture or focal bone lesion. Soft tissues and spinal canal: No significant soft tissue finding. 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 Other levels negative. Upper chest: Scarring and or atelectasis in the left upper lobe. Other: None IMPRESSION: No acute or traumatic finding. 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 Electronically Signed   By: Nelson Chimes M.D.   On: 04/23/2022 19:50   CT Head Wo Contrast  Result Date: 04/23/2022 CLINICAL DATA:  New onset seizures. EXAM: CT HEAD WITHOUT CONTRAST TECHNIQUE: Contiguous axial images were obtained from the base of the skull through the vertex without intravenous contrast. RADIATION DOSE REDUCTION: This exam was performed according to the departmental dose-optimization program which includes automated exposure control, adjustment of the mA and/or kV according to patient size and/or use of iterative reconstruction technique. COMPARISON:  Jul 20, 2021 FINDINGS: Brain: There is mild cerebral atrophy with widening of the extra-axial spaces and ventricular dilatation. There are areas of decreased attenuation within the white matter tracts of the supratentorial brain, consistent with microvascular disease changes. Vascular: There is marked severity calcification of the bilateral cavernous carotid arteries. Skull: Normal. Negative for fracture or focal lesion. Sinuses/Orbits: Mild ethmoid sinus mucosal thickening is seen. A small chronic versus postoperative defect is seen involving the medial wall of the left maxillary sinus. Other: None. IMPRESSION: 1. No acute intracranial abnormality. 2. Generalized cerebral atrophy with widening of the extra-axial spaces and ventricular dilatation. Electronically Signed    By: Virgina Norfolk M.D.   On: 04/23/2022 19:48   DG Chest 1 View  Result Date: 04/23/2022 CLINICAL DATA:  Seizure EXAM: CHEST  1 VIEW COMPARISON:  08/19/2021 x-ray and older FINDINGS: Hyperinflation. No consolidation, pneumothorax or effusion. Normal cardiopericardial silhouette without edema. Calcified aorta. IMPRESSION: No acute cardiopulmonary disease Electronically Signed   By: Jill Side M.D.   On: 04/23/2022 15:25      Assessment/Plan   Principal Problem:   COVID-19 virus infection   ***       ***            ***             ***            ***            ***            ***             ***           ***           ***   ***  DVT prophylaxis:  SCD's ***  Code Status: Full code*** Family Communication: none*** Disposition Plan: Per Rounding Team Consults called: none***;  Admission status: ***     I SPENT GREATER THAN 75 *** MINUTES IN CLINICAL CARE TIME/MEDICAL DECISION-MAKING IN COMPLETING THIS ADMISSION.      [place] DO [facility]  From [place]   04/23/2022, 9:44 PM   ***

## 2022-04-23 NOTE — ED Provider Triage Note (Signed)
Emergency Medicine Provider Triage Evaluation Note  [place] , a 82 y.o. female  was evaluated in triage.  Pt complains of seizures.  Patient was brought in by EMS who reported confusion about exact reason that they were calling the family's home.  There appears to have been some language discrepancy, obtaining further history from patient.  Patient is able to respond to any questions in [place] or when using a [place] interpreter. Patient has history of dementia. Unsure if this is patient's baseline.  Review of Systems  Positive: As above Negative: As above  Physical Exam  BP 135/61 (BP Location: Right Arm)   Pulse 95   Temp 98.4 F (36.9 C) (Axillary)   Resp 17   SpO2 100%  Gen:   Awake, no distress, does not respond to questions Resp:  Normal effort  MSK:   Moves extremities without difficulty  Other:    Medical Decision Making  Medically screening exam initiated at 2:24 PM.  Appropriate orders placed.  Fredericka Pranathi Doran was informed that the remainder of the evaluation will be completed by another provider, this initial triage assessment does not replace that evaluation, and the importance of remaining in the ED until their evaluation is complete.     Luvenia Heller, PA-C 04/23/22 1425

## 2022-04-23 NOTE — ED Triage Notes (Signed)
Pt arrives from home via EMS. Language barrier with EMS and family. FAmily speaks [place] and only one person spoke broken [place]. EMS gathered that pt does not speak at all. She possibly had a seizure, whole body shaking. Unsure if she has a hx. Pt resting comfortably in stretcher. Able to open eyes to touch, voice. BP 130/70, HR 90, CBG 190, 98% sinus rhythm

## 2022-04-23 NOTE — ED Provider Notes (Signed)
Pt signed out by Dr. Pearline Cables.  +Covid  UDS neg UA neg  CT head: No acute intracranial abnormality.  2. Generalized cerebral atrophy with widening of the extra-axial  spaces and ventricular dilatation.   CT c-spine: No acute or traumatic finding. Chronic degenerative spondylosis C4-5  and C5-6 with bony foraminal narrowing on the right at those 2  levels.   CT chest/abd/pelvis:  Marland Kitchen Air-fluid level in the bladder. Correlate clinically for  cystitis.  2. No acute localizing process in the chest, abdomen or pelvis.  3. Cholelithiasis.  4. Ascending aortic aneurysm measuring 4 cm. Recommend annual  imaging followup by CTA or MRA. This recommendation follows 2010  ACCF/AHA/AATS/ACR/ASA/SCA/SCAI/SIR/STS/SVM Guidelines for the  Diagnosis and Management of Patients with Thoracic Aortic Disease.  Circulation. 2010; [id number]. Aortic aneurysm NOS (ICD10-I71.9)  5. Subcentimeter incidental left thyroid nodule. No follow-up  imaging is recommended.  Reference: J Am Coll Radiol. 2015 Feb;12(2): 143-50  6. Extravasated contrast in the right forearm.   R forearm IV blew during contrast administration.  Pt does have some swelling.  ice pack applied.    Pt d/w Dr. Velia Meyer ([facility]) for admission.  Kara Dennis was evaluated in Emergency Department on 04/23/2022 for the symptoms described in the history of present illness. She was evaluated in the context of the global COVID-19 pandemic, which necessitated consideration that the patient might be at risk for infection with the SARS-CoV-2 virus that causes COVID-19. Institutional protocols and algorithms that pertain to the evaluation of patients at risk for COVID-19 are in a state of rapid change based on information released by regulatory bodies including the CDC and federal and state organizations. These policies and algorithms were followed during the patient's care in the ED.      Isla Pence, MD 04/23/22 2137

## 2022-04-24 ENCOUNTER — Encounter (HOSPITAL_COMMUNITY): Payer: Self-pay | Admitting: Internal Medicine

## 2022-04-24 ENCOUNTER — Other Ambulatory Visit: Payer: Self-pay

## 2022-04-24 ENCOUNTER — Inpatient Hospital Stay (HOSPITAL_COMMUNITY): Payer: Medicare Other

## 2022-04-24 DIAGNOSIS — R059 Cough, unspecified: Secondary | ICD-10-CM | POA: Diagnosis present

## 2022-04-24 DIAGNOSIS — I7121 Aneurysm of the ascending aorta, without rupture: Secondary | ICD-10-CM | POA: Diagnosis present

## 2022-04-24 DIAGNOSIS — R55 Syncope and collapse: Secondary | ICD-10-CM

## 2022-04-24 DIAGNOSIS — I10 Essential (primary) hypertension: Secondary | ICD-10-CM | POA: Diagnosis not present

## 2022-04-24 DIAGNOSIS — E860 Dehydration: Secondary | ICD-10-CM | POA: Diagnosis not present

## 2022-04-24 DIAGNOSIS — I5032 Chronic diastolic (congestive) heart failure: Secondary | ICD-10-CM | POA: Diagnosis present

## 2022-04-24 DIAGNOSIS — U071 COVID-19: Secondary | ICD-10-CM | POA: Diagnosis not present

## 2022-04-24 LAB — MAGNESIUM: Magnesium: 1.8 mg/dL (ref 1.7–2.4)

## 2022-04-24 LAB — COMPREHENSIVE METABOLIC PANEL
ALT: 13 U/L (ref 0–44)
AST: 27 U/L (ref 15–41)
Albumin: 3.1 g/dL — ABNORMAL LOW (ref 3.5–5.0)
Alkaline Phosphatase: 48 U/L (ref 38–126)
Anion gap: 13 (ref 5–15)
BUN: 16 mg/dL (ref 8–23)
CO2: 19 mmol/L — ABNORMAL LOW (ref 22–32)
Calcium: 8.9 mg/dL (ref 8.9–10.3)
Chloride: 105 mmol/L (ref 98–111)
Creatinine, Ser: 1.21 mg/dL — ABNORMAL HIGH (ref 0.44–1.00)
GFR, Estimated: 45 mL/min — ABNORMAL LOW (ref >60)
Glucose, Bld: 110 mg/dL — ABNORMAL HIGH (ref 70–99)
Potassium: 3.5 mmol/L (ref 3.5–5.1)
Sodium: 137 mmol/L (ref 135–145)
Total Bilirubin: 0.7 mg/dL (ref 0.3–1.2)
Total Protein: 6 g/dL — ABNORMAL LOW (ref 6.5–8.1)

## 2022-04-24 LAB — CBC WITH DIFFERENTIAL/PLATELET
Abs Immature Granulocytes: 0.01 10*3/uL (ref 0.00–0.07)
Basophils Absolute: 0 10*3/uL (ref 0.0–0.1)
Basophils Relative: 0 %
Eosinophils Absolute: 0 10*3/uL (ref 0.0–0.5)
Eosinophils Relative: 0 %
HCT: 32.4 % — ABNORMAL LOW (ref 36.0–46.0)
Hemoglobin: 11.5 g/dL — ABNORMAL LOW (ref 12.0–15.0)
Immature Granulocytes: 0 %
Lymphocytes Relative: 19 %
Lymphs Abs: 1.2 10*3/uL (ref 0.7–4.0)
MCH: 31.3 pg (ref 26.0–34.0)
MCHC: 35.5 g/dL (ref 30.0–36.0)
MCV: 88.3 fL (ref 80.0–100.0)
Monocytes Absolute: 0.6 10*3/uL (ref 0.1–1.0)
Monocytes Relative: 9 %
Neutro Abs: 4.8 10*3/uL (ref 1.7–7.7)
Neutrophils Relative %: 72 %
Platelets: 191 10*3/uL (ref 150–400)
RBC: 3.67 MIL/uL — ABNORMAL LOW (ref 3.87–5.11)
RDW: 13 % (ref 11.5–15.5)
WBC: 6.7 10*3/uL (ref 4.0–10.5)
nRBC: 0 % (ref 0.0–0.2)

## 2022-04-24 LAB — ETHANOL: Alcohol, Ethyl (B): <10 mg/dL (ref <10)

## 2022-04-24 LAB — ECHOCARDIOGRAM COMPLETE
AR max vel: 2.48 cm2
AV Area VTI: 2.23 cm2
AV Area mean vel: 2.57 cm2
AV Mean grad: 12 mmHg
AV Peak grad: 21.9 mmHg
Ao pk vel: 2.34 m/s
Height: 60 in
S' Lateral: 2.2 cm
Weight: 2064 oz

## 2022-04-24 LAB — PHOSPHORUS: Phosphorus: 2.5 mg/dL (ref 2.5–4.6)

## 2022-04-24 LAB — C-REACTIVE PROTEIN: CRP: 4.2 mg/dL — ABNORMAL HIGH (ref <1.0)

## 2022-04-24 MED ORDER — ENOXAPARIN SODIUM 40 MG/0.4ML IJ SOSY: 40.0000 mg | PREFILLED_SYRINGE | INTRAMUSCULAR | Status: DC

## 2022-04-24 MED ORDER — MAGNESIUM SULFATE IN D5W 1-5 GM/100ML-% IV SOLN
1.0000 g | Freq: Once | INTRAVENOUS | Status: AC
  Administered 2022-04-24: 1 g via INTRAVENOUS
  Filled 2022-04-24: qty 100

## 2022-04-24 MED ORDER — ENOXAPARIN SODIUM 30 MG/0.3ML IJ SOSY: 30.0000 mg | PREFILLED_SYRINGE | INTRAMUSCULAR | Status: DC

## 2022-04-24 NOTE — Progress Notes (Signed)
PROGRESS NOTE        PATIENT DETAILS Name: Kara Dennis Age: 82 y.o. Sex: female Date of Birth: 1940/11/29 Admit Date: 04/23/2022 Admitting Physician Rhetta Mura, DO [id number]  Brief Summary: Patient is a 82 y.o.  female with history of chronic HFpEF, HTN, dementia-who presented with generalized weakness, cough, syncope (with coughing spells)-she was found to have COVID-19 infection admitted to the hospitalist service.  Significant events: 2/13>> admit to [facility]  Significant studies: 2/13>> CXR: No PNA 2/13>> CT head: No acute intracranial abnormality 2/13>> CT C-spine: No fracture/traumatic finding 2/13>> CT L-spine: Old compression fracture T12 2/13>> CT chest/abdomen/pelvis: No acute process in the chest/abdomen/pelvis 2/14>> echo: EF 70-75%  Significant microbiology data: 2/13>> COVID PCR: Positive 2/13>> influenza/RSV PCR: Negative  Procedures: None  Consults: None  Subjective: Lying comfortably in bed-denies any chest pain or shortness of breath.  Objective: Vitals: Blood pressure 119/61, pulse 87, temperature 97.8 F (36.6 C), temperature source Axillary, resp. rate 12, height 5' (1.524 m), weight 58.5 kg, SpO2 98 %.   Exam: Gen Exam:Alert awake-not in any distress.  Looks very frail. HEENT:atraumatic, normocephalic Chest: B/L clear to auscultation anteriorly CVS:S1S2 regular Abdomen:soft non tender, non distended Extremities:no edema Neurology: Non focal Skin: no rash  Pertinent Labs/Radiology:    Latest Ref Rng & Units 04/24/2022    4:40 AM 04/23/2022    2:24 PM 08/20/2021    6:15 AM  CBC  WBC 4.0 - 10.5 K/uL 6.7  7.4  4.9   Hemoglobin 12.0 - 15.0 g/dL 11.5  12.2  11.8   Hematocrit 36.0 - 46.0 % 32.4  35.3  34.2   Platelets 150 - 400 K/uL 191  203  191     Lab Results  Component Value Date   NA 137 04/24/2022   K 3.5 04/24/2022   CL 105 04/24/2022   CO2 19 (L) 04/24/2022       Assessment/Plan: COVID-19 infection No PNA on imaging studies Not hypoxic Continue Paxlovid  Syncope Likely vasovagal-as this occurred in the setting of coughing spells. Telemetry monitoring overnight negative for arrhythmias Echo with stable EF  Debility/deconditioning Appears to be worsening-this is in the setting of COVID-19 infection.  Appears to have some amount of debility at baseline-needs assistant with ambulation per daughter at bedside PT/OT eval  HTN BP appears relatively stable without the use of any antihypertensives Continue dietary/lifestyle modification Not on any antihypertensives at home  Dementia At risk for delirium Maintain delirium precautions  4 cm ascending aortic aneurysm: Radiology commending annual imaging by CTA/MRA  Subcentimeter incidental left thyroid nodule Per radiology-no follow-up is recommended  BMI: Estimated body mass index is 25.19 kg/m as calculated from the following:   Height as of this encounter: 5' (1.524 m).   Weight as of this encounter: 58.5 kg.   Code status:   Code Status: Full Code   DVT Prophylaxis: enoxaparin (LOVENOX) injection 40 mg Start: 04/24/22 1215 SCDs Start: 04/23/22 2140   Family Communication: None at bedside   Disposition Plan: Status is: Inpatient Remains inpatient appropriate because: Severity of illness   Planned Discharge Destination:Home health   Diet: Diet Order             Diet regular Room service appropriate? Yes; Fluid consistency: Thin  Diet effective now  Antimicrobial agents: Anti-infectives (From admission, onward)    Start     Dose/Rate Route Frequency Ordered Stop   04/23/22 2200  nirmatrelvir/ritonavir (PAXLOVID) 3 tablet  Status:  Discontinued        3 tablet Oral 2 times daily 04/23/22 2143 04/23/22 2148   04/23/22 2200  nirmatrelvir/ritonavir (renal dosing) (PAXLOVID) 2 tablet        2 tablet Oral 2 times daily 04/23/22 2148 04/28/22  2159        MEDICATIONS: Scheduled Meds:  enoxaparin (LOVENOX) injection  40 mg Subcutaneous Q24H   nirmatrelvir/ritonavir (renal dosing)  2 tablet Oral BID   Continuous Infusions: PRN Meds:.acetaminophen **OR** acetaminophen, albuterol, benzonatate, melatonin   I have personally reviewed following labs and imaging studies  LABORATORY DATA: CBC: Recent Labs  Lab 04/23/22 1424 04/24/22 0440  WBC 7.4 6.7  NEUTROABS 6.4 4.8  HGB 12.2 11.5*  HCT 35.3* 32.4*  MCV 89.4 88.3  PLT 203 [id number]    Basic Metabolic Panel: Recent Labs  Lab 04/23/22 1424 04/23/22 1459 04/24/22 0440  NA 138  --  137  K 3.8  --  3.5  CL 106  --  105  CO2 22  --  19*  GLUCOSE 155*  --  110*  BUN 15  --  16  CREATININE 1.22*  --  1.21*  CALCIUM 9.0  --  8.9  MG  --  1.8 1.8  PHOS  --   --  2.5    GFR: Estimated Creatinine Clearance: 29.2 mL/min (A) (by C-G formula based on SCr of 1.21 mg/dL (H)).  Liver Function Tests: Recent Labs  Lab 04/23/22 1424 04/24/22 0440  AST 25 27  ALT 12 13  ALKPHOS 60 48  BILITOT 0.5 0.7  PROT 6.6 6.0*  ALBUMIN 3.4* 3.1*   No results for input(s): "LIPASE", "AMYLASE" in the last 168 hours. No results for input(s): "AMMONIA" in the last 168 hours.  Coagulation Profile: No results for input(s): "INR", "PROTIME" in the last 168 hours.  Cardiac Enzymes: Recent Labs  Lab 04/23/22 1459  CKTOTAL 112    BNP (last 3 results) No results for input(s): "PROBNP" in the last 8760 hours.  Lipid Profile: No results for input(s): "CHOL", "HDL", "LDLCALC", "TRIG", "CHOLHDL", "LDLDIRECT" in the last 72 hours.  Thyroid Function Tests: No results for input(s): "TSH", "T4TOTAL", "FREET4", "T3FREE", "THYROIDAB" in the last 72 hours.  Anemia Panel: No results for input(s): "VITAMINB12", "FOLATE", "FERRITIN", "TIBC", "IRON", "RETICCTPCT" in the last 72 hours.  Urine analysis:    Component Value Date/Time   COLORURINE YELLOW 04/23/2022 1424   APPEARANCEUR  CLEAR 04/23/2022 1424   LABSPEC 1.010 04/23/2022 1424   PHURINE 7.0 04/23/2022 1424   GLUCOSEU NEGATIVE 04/23/2022 1424   HGBUR SMALL (A) 04/23/2022 1424   BILIRUBINUR NEGATIVE 04/23/2022 1424   BILIRUBINUR negative 05/22/2016 0932   KETONESUR NEGATIVE 04/23/2022 1424   PROTEINUR NEGATIVE 04/23/2022 1424   UROBILINOGEN 0.2 05/22/2016 0932   NITRITE NEGATIVE 04/23/2022 1424   LEUKOCYTESUR NEGATIVE 04/23/2022 1424    Sepsis Labs: Lactic Acid, Venous    Component Value Date/Time   LATICACIDVEN 1.6 08/19/2021 1316    MICROBIOLOGY: Recent Results (from the past 240 hour(s))  Resp panel by RT-PCR (RSV, Flu A&B, Covid) Anterior Nasal Swab     Status: Abnormal   Collection Time: 04/23/22  3:48 PM   Specimen: Anterior Nasal Swab  Result Value Ref Range Status   SARS Coronavirus 2 by RT PCR POSITIVE (A) NEGATIVE Final  Influenza A by PCR NEGATIVE NEGATIVE Final   Influenza B by PCR NEGATIVE NEGATIVE Final    Comment: (NOTE) The Xpert Xpress SARS-CoV-2/FLU/RSV plus assay is intended as an aid in the diagnosis of influenza from Nasopharyngeal swab specimens and should not be used as a sole basis for treatment. Nasal washings and aspirates are unacceptable for Xpert Xpress SARS-CoV-2/FLU/RSV testing.  Fact Sheet for Patients: EntrepreneurPulse.com.au  Fact Sheet for Healthcare Providers: IncredibleEmployment.be  This test is not yet approved or cleared by the [place] FDA and has been authorized for detection and/or diagnosis of SARS-CoV-2 by FDA under an Emergency Use Authorization (EUA). This EUA will remain in effect (meaning this test can be used) for the duration of the COVID-19 declaration under Section 564(b)(1) of the Act, 21 U.S.C. section 360bbb-3(b)(1), unless the authorization is terminated or revoked.     Resp Syncytial Virus by PCR NEGATIVE NEGATIVE Final    Comment: (NOTE) Fact Sheet for  Patients: EntrepreneurPulse.com.au  Fact Sheet for Healthcare Providers: IncredibleEmployment.be  This test is not yet approved or cleared by the [place] FDA and has been authorized for detection and/or diagnosis of SARS-CoV-2 by FDA under an Emergency Use Authorization (EUA). This EUA will remain in effect (meaning this test can be used) for the duration of the COVID-19 declaration under Section 564(b)(1) of the Act, 21 U.S.C. section 360bbb-3(b)(1), unless the authorization is terminated or revoked.  Performed at [facility], [place] [address] [place], [place] [address]     RADIOLOGY STUDIES/RESULTS: ECHOCARDIOGRAM COMPLETE  Result Date: 04/24/2022    ECHOCARDIOGRAM REPORT   Patient Name:   Kara Dennis [facility] Date of Exam: 04/24/2022 Medical Rec #:  [id number]     Height:       60.0 in Accession #:    [id number]    Weight:       129.0 lb Date of Birth:  01/07/1941      BSA:          1.549 m Patient Age:    47 years      BP:           143/89 mmHg Patient Gender: F             HR:           78 bpm. Exam Location:  Inpatient Procedure: 2D Echo, Cardiac Doppler and Color Doppler Indications:    syncope  History:        Patient has prior history of Echocardiogram examinations, most                 recent 08/20/2021. Acsending aortic aneurysm and CHF, Covid.;                 Risk Factors:Hypertension and Dyslipidemia.  Sonographer:    Johny Chess RDCS Referring Phys: [place]  Sonographer Comments: Image acquisition challenging due to uncooperative patient and Image acquisition challenging due to patient behavioral factors. IMPRESSIONS  1. Left ventricular ejection fraction, by estimation, is 70 to 75%. The left ventricle has hyperdynamic function. The left ventricle has no regional wall motion abnormalities. Left ventricular diastolic function could not be evaluated.  2. Right ventricular systolic function is normal. The right ventricular  size is normal. There is normal pulmonary artery systolic pressure. The estimated right ventricular systolic pressure is [id number] mmHg.  3. The mitral valve is normal in structure. Trivial mitral valve regurgitation. No evidence of mitral stenosis.  4. The aortic valve is tricuspid.  There is moderate calcification of the aortic valve. There is moderate thickening of the aortic valve. Aortic valve regurgitation is trivial. Aortic valve sclerosis/calcification is present, without any evidence of aortic stenosis. Aortic valve area, by VTI measures 2.23 cm. Aortic valve mean gradient measures 12.0 mmHg. Aortic valve Vmax measures 2.34 m/s.  5. The inferior vena cava is normal in size with greater than 50% respiratory variability, suggesting right atrial pressure of 3 mmHg. FINDINGS  Left Ventricle: Left ventricular ejection fraction, by estimation, is 70 to 75%. The left ventricle has hyperdynamic function. The left ventricle has no regional wall motion abnormalities. The left ventricular internal cavity size was normal in size. There is no left ventricular hypertrophy. Left ventricular diastolic function could not be evaluated. Right Ventricle: The right ventricular size is normal. No increase in right ventricular wall thickness. Right ventricular systolic function is normal. There is normal pulmonary artery systolic pressure. The tricuspid regurgitant velocity is 1.75 m/s, and  with an assumed right atrial pressure of 3 mmHg, the estimated right ventricular systolic pressure is [id number] mmHg. Left Atrium: Left atrial size was normal in size. Right Atrium: Right atrial size was normal in size. Pericardium: There is no evidence of pericardial effusion. Mitral Valve: The mitral valve is normal in structure. Mild mitral annular calcification. Trivial mitral valve regurgitation. No evidence of mitral valve stenosis. Tricuspid Valve: The tricuspid valve is normal in structure. Tricuspid valve regurgitation is mild . No evidence of  tricuspid stenosis. Aortic Valve: The aortic valve is tricuspid. There is moderate calcification of the aortic valve. There is moderate thickening of the aortic valve. Aortic valve regurgitation is trivial. Aortic valve sclerosis/calcification is present, without any evidence of aortic stenosis. Aortic valve mean gradient measures 12.0 mmHg. Aortic valve peak gradient measures 21.9 mmHg. Aortic valve area, by VTI measures 2.23 cm. Pulmonic Valve: The pulmonic valve was normal in structure. Pulmonic valve regurgitation is not visualized. No evidence of pulmonic stenosis. Aorta: The aortic root is normal in size and structure. Venous: The inferior vena cava is normal in size with greater than 50% respiratory variability, suggesting right atrial pressure of 3 mmHg. IAS/Shunts: No atrial level shunt detected by color flow Doppler.  LEFT VENTRICLE PLAX 2D LVIDd:         3.60 cm   Diastology LVIDs:         2.20 cm   LV e' medial:  4.57 cm/s LV PW:         1.00 cm   LV e' lateral: 4.90 cm/s LV IVS:        0.80 cm LVOT diam:     1.90 cm LV SV:         117 LV SV Index:   75 LVOT Area:     2.84 cm  RIGHT VENTRICLE             IVC RV Basal diam:  2.40 cm     IVC diam: 1.10 cm RV S prime:     10.90 cm/s TAPSE (M-mode): 1.8 cm LEFT ATRIUM             Index        RIGHT ATRIUM          Index LA diam:        3.50 cm 2.26 cm/m   RA Area:     9.31 cm LA Vol (A2C):   20.5 ml 13.23 ml/m  RA Volume:   17.50 ml 11.30 ml/m LA Vol (A4C):  35.0 ml 22.59 ml/m LA Biplane Vol: 28.0 ml 18.07 ml/m  AORTIC VALVE AV Area (Vmax):    2.48 cm AV Area (Vmean):   2.57 cm AV Area (VTI):     2.23 cm AV Vmax:           234.00 cm/s AV Vmean:          162.000 cm/s AV VTI:            0.523 m AV Peak Grad:      21.9 mmHg AV Mean Grad:      12.0 mmHg LVOT Vmax:         205.00 cm/s LVOT Vmean:        147.000 cm/s LVOT VTI:          0.411 m LVOT/AV VTI ratio: 0.79  AORTA Ao Root diam: 3.30 cm TRICUSPID VALVE TR Peak grad:   12.2 mmHg TR Vmax:         175.00 cm/s  SHUNTS Systemic VTI:  0.41 m Systemic Diam: 1.90 cm Fransico Him MD Electronically signed by Fransico Him MD Signature Date/Time: 04/24/2022/11:01:56 AM    Final    CT L-SPINE NO CHARGE  Result Date: 04/23/2022 CLINICAL DATA:  Seizure.  Back pain. EXAM: CT LUMBAR SPINE WITHOUT CONTRAST TECHNIQUE: Multidetector CT imaging of the lumbar spine was performed without intravenous contrast administration. Multiplanar CT image reconstructions were also generated. RADIATION DOSE REDUCTION: This exam was performed according to the departmental dose-optimization program which includes automated exposure control, adjustment of the mA and/or kV according to patient size and/or use of iterative reconstruction technique. COMPARISON:  Lateral chest radiograph 06/05/2016 FINDINGS: Segmentation: 5 lumbar type vertebral bodies. Alignment: Thoracolumbar curvature convex to the right and lower lumbar curvature convex to the left. Vertebrae: Compression deformity at T12 that I think it is probably old. Loss of height anteriorly of 40%. No retropulsed bone. No other regional bone injury. Paraspinal and other soft tissues: Negative other than aortic atherosclerosis. Disc levels: No significant disc level pathology at L1-2 or above. L2-3: Endplate osteophytes and bulging of the disc more prominent towards the left. Narrowing of the left lateral recess and intervertebral foramen on the left that could cause left-sided neural compression. L3-4: Bulging of the disc. Mild narrowing of both lateral recesses without definite neural compression. L4-5: Bulging of the disc. Mild narrowing of the lateral recesses without likely neural compression. L5-S1: No disc abnormality.  Mild facet osteoarthritis.  No stenosis IMPRESSION: 1. Probably old compression fracture at T12 with loss of height anteriorly of 40%. No retropulsed bone. 2. L2-3: Endplate osteophytes and bulging of the disc more prominent towards the left. Narrowing of the  left lateral recess and intervertebral foramen on the left that could cause left-sided neural compression. 3. L3-4: Bulging of the disc. Mild narrowing of both lateral recesses without likely neural compression. 4. L4-5: Bulging of the disc. Mild narrowing of the lateral recesses without likely neural compression. 5. L5-S1: No disc abnormality. Mild facet osteoarthritis. No stenosis. 6. Aortic atherosclerosis. Aortic Atherosclerosis (ICD10-I70.0). Electronically Signed   By: Nelson Chimes M.D.   On: 04/23/2022 20:52   CT CHEST ABDOMEN PELVIS WO CONTRAST  Result Date: 04/23/2022 CLINICAL DATA:  Seizure.  Abdominal pain. EXAM: CT CHEST, ABDOMEN AND PELVIS WITHOUT CONTRAST TECHNIQUE: Multidetector CT imaging of the chest, abdomen and pelvis was performed following the standard protocol without IV contrast. RADIATION DOSE REDUCTION: This exam was performed according to the departmental dose-optimization program which includes automated exposure control, adjustment  of the mA and/or kV according to patient size and/or use of iterative reconstruction technique. COMPARISON:  None Available. FINDINGS: CT CHEST FINDINGS Cardiovascular: There is mild aneurysmal dilatation of the ascending aorta measuring 4 cm. Heart is normal in size. There is no pericardial effusion. There are atherosclerotic calcifications of the aorta. Mediastinum/Nodes: There is a hypodense left thyroid nodule measuring 8 mm. Bilateral thyroid calcifications are present. There are no enlarged mediastinal or hilar lymph nodes. There is a small hiatal hernia. The esophagus is within normal limits as visualized. Lungs/Pleura: There is some linear scarring or atelectasis in the left lung apex. Mild emphysematous changes are present. Lungs are otherwise clear. There is no pleural effusion or pneumothorax. Musculoskeletal: There is moderate chronic compression deformity of T12. No acute fractures are seen. CT ABDOMEN PELVIS FINDINGS Hepatobiliary: Small  gallstones are present. There is no biliary ductal dilatation. No focal liver lesions are seen. Pancreas: Unremarkable. No pancreatic ductal dilatation or surrounding inflammatory changes. Spleen: Normal in size without focal abnormality. Adrenals/Urinary Tract: There is an air-fluid level in the bladder. Kidneys and adrenal glands are within normal limits. Stomach/Bowel: Stomach is within normal limits. Appendix appears normal. No evidence of bowel wall thickening, distention, or inflammatory changes. Vascular/Lymphatic: Aortic atherosclerosis. No enlarged abdominal or pelvic lymph nodes. Reproductive: Status post hysterectomy. No adnexal masses. Other: No abdominal wall hernia or abnormality. No abdominopelvic ascites. Musculoskeletal: No acute osseous abnormality. Extravasated contrast noted in the right forearm. IMPRESSION: 1. Air-fluid level in the bladder. Correlate clinically for cystitis. 2. No acute localizing process in the chest, abdomen or pelvis. 3. Cholelithiasis. 4. Ascending aortic aneurysm measuring 4 cm. Recommend annual imaging followup by CTA or MRA. This recommendation follows 2010 ACCF/AHA/AATS/ACR/ASA/SCA/SCAI/SIR/STS/SVM Guidelines for the Diagnosis and Management of Patients with Thoracic Aortic Disease. Circulation. 2010; [id number]. Aortic aneurysm NOS (ICD10-I71.9) 5. Subcentimeter incidental left thyroid nodule. No follow-up imaging is recommended. Reference: J Am Coll Radiol. 2015 Feb;12(2): 143-50 6. Extravasated contrast in the right forearm. Aortic Atherosclerosis (ICD10-I70.0). Electronically Signed   By: Ronney Asters M.D.   On: 04/23/2022 19:54   CT Cervical Spine Wo Contrast  Result Date: 04/23/2022 CLINICAL DATA:  Seizure.  Neck trauma. EXAM: CT CERVICAL SPINE WITHOUT CONTRAST TECHNIQUE: Multidetector CT imaging of the cervical spine was performed without intravenous contrast. Multiplanar CT image reconstructions were also generated. RADIATION DOSE REDUCTION: This exam  was performed according to the departmental dose-optimization program which includes automated exposure control, adjustment of the mA and/or kV according to patient size and/or use of iterative reconstruction technique. COMPARISON:  07/20/2021 FINDINGS: Alignment: No malalignment. Skull base and vertebrae: No fracture or focal bone lesion. Soft tissues and spinal canal: No significant soft tissue finding. 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 Other levels negative. Upper chest: Scarring and or atelectasis in the left upper lobe. Other: None IMPRESSION: No acute or traumatic finding. 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 Electronically Signed   By: Nelson Chimes M.D.   On: 04/23/2022 19:50   CT Head Wo Contrast  Result Date: 04/23/2022 CLINICAL DATA:  New onset seizures. EXAM: CT HEAD WITHOUT CONTRAST TECHNIQUE: Contiguous axial images were obtained from the base of the skull through the vertex without intravenous contrast. RADIATION DOSE REDUCTION: This exam was performed according to the departmental dose-optimization program which includes automated exposure control, adjustment of the mA and/or kV according to  patient size and/or use of iterative reconstruction technique. COMPARISON:  Jul 20, 2021 FINDINGS: Brain: There is mild cerebral atrophy with widening of the extra-axial spaces and ventricular dilatation. There are areas of decreased attenuation within the white matter tracts of the supratentorial brain, consistent with microvascular disease changes. Vascular: There is marked severity calcification of the bilateral cavernous carotid arteries. Skull: Normal. Negative for fracture or focal lesion. Sinuses/Orbits: Mild ethmoid sinus mucosal thickening is seen. A small chronic versus postoperative defect is seen involving the medial wall of the left  maxillary sinus. Other: None. IMPRESSION: 1. No acute intracranial abnormality. 2. Generalized cerebral atrophy with widening of the extra-axial spaces and ventricular dilatation. Electronically Signed   By: Virgina Norfolk M.D.   On: 04/23/2022 19:48   DG Chest 1 View  Result Date: 04/23/2022 CLINICAL DATA:  Seizure EXAM: CHEST  1 VIEW COMPARISON:  08/19/2021 x-ray and older FINDINGS: Hyperinflation. No consolidation, pneumothorax or effusion. Normal cardiopericardial silhouette without edema. Calcified aorta. IMPRESSION: No acute cardiopulmonary disease Electronically Signed   By: Jill Side M.D.   On: 04/23/2022 15:25     LOS: 1 day   Oren Binet, MD  [facility]    To contact the attending provider between 7A-7P or the covering provider during after hours 7P-7A, please log into the web site [email or web address] and access using universal [place] password for that web site. If you do not have the password, please call the hospital operator.  04/24/2022, 12:05 PM

## 2022-04-24 NOTE — Progress Notes (Signed)
  Echocardiogram 2D Echocardiogram has been performed.  Kara Dennis 04/24/2022, 10:00 AM

## 2022-04-24 NOTE — ED Notes (Signed)
Echo at bedside

## 2022-04-24 NOTE — ED Notes (Signed)
ED TO INPATIENT HANDOFF REPORT  ED Nurse Name and Phone #: Anette Guarneri [phone]  S Name/Age/Gender Kara Dennis 82 y.o. female Room/Bed: 008C/008C  Code Status   Code Status: Full Code  Home/SNF/Other Home Disoriented, Hx of Dementia Is this baseline? Yes   Triage Complete: Triage complete  Chief Complaint COVID-19 virus infection [U07.1]  Triage Note Pt arrives from home via EMS. Language barrier with EMS and family. FAmily speaks [place] and only one person spoke broken [place]. EMS gathered that pt does not speak at all. She possibly had a seizure, whole body shaking. Unsure if she has a hx. Pt resting comfortably in stretcher. Able to open eyes to touch, voice. BP 130/70, HR 90, CBG 190, 98% sinus rhythm   Allergies Allergies  Allergen Reactions   Hydrochlorothiazide     Feet Swelling    Level of Care/Admitting Diagnosis ED Disposition     ED Disposition  Admit   Condition  --   Comment  Hospital Area: [facility] [100100]  Level of Care: Telemetry Medical [104]  May admit patient to [facility] or [facility] if equivalent level of care is available:: No  Covid Evaluation: Confirmed COVID Positive  Diagnosis: COVID-19 virus infection [id number]  Admitting Physician: Rhetta Mura [id number]  Attending Physician: Rhetta Mura [id number]  Certification:: I certify this patient will need inpatient services for at least 2 midnights  Estimated Length of Stay: 2          B Medical/Surgery History Past Medical History:  Diagnosis Date   Bradycardia 2014    identified before hysterectomy, treated for short time with medication before during and after hysterectomy    Cervical cancer ([place]) 2014    s/p hysterectomy, no chemo or radiation    Dementia ([place])    HTN (hypertension) 2013   took some medicine in [place]    Past Surgical History:  Procedure Laterality Date   ABDOMINAL HYSTERECTOMY  05/2012      A IV  Location/Drains/Wounds Patient Lines/Drains/Airways Status     Active Line/Drains/Airways     Name Placement date Placement time Site Days   Peripheral IV 04/23/22 20 G Anterior;Proximal;Right Forearm 04/23/22  1800  Forearm  1            Intake/Output Last 24 hours No intake or output data in the 24 hours ending 04/24/22 1151  Labs/Imaging Results for orders placed or performed during the hospital encounter of 04/23/22 (from the past 48 hour(s))  Procalcitonin - Baseline     Status: None   Collection Time: 04/23/22  2:22 PM  Result Value Ref Range   Procalcitonin 0.19 ng/mL    Comment:        Interpretation: PCT (Procalcitonin) <= 0.5 ng/mL: Systemic infection (sepsis) is not likely. Local bacterial infection is possible. (NOTE)       Sepsis PCT Algorithm           Lower Respiratory Tract                                      Infection PCT Algorithm    ----------------------------     ----------------------------         PCT < 0.25 ng/mL                PCT < 0.10 ng/mL          Strongly encourage  Strongly discourage   discontinuation of antibiotics    initiation of antibiotics    ----------------------------     -----------------------------       PCT 0.25 - 0.50 ng/mL            PCT 0.10 - 0.25 ng/mL               OR       >80% decrease in PCT            Discourage initiation of                                            antibiotics      Encourage discontinuation           of antibiotics    ----------------------------     -----------------------------         PCT >= 0.50 ng/mL              PCT 0.26 - 0.50 ng/mL               AND        <80% decrease in PCT             Encourage initiation of                                             antibiotics       Encourage continuation           of antibiotics    ----------------------------     -----------------------------        PCT >= 0.50 ng/mL                  PCT > 0.50 ng/mL               AND          increase in PCT                  Strongly encourage                                      initiation of antibiotics    Strongly encourage escalation           of antibiotics                                     -----------------------------                                           PCT <= 0.25 ng/mL                                                 OR                                        >  80% decrease in PCT                                      Discontinue / Do not initiate                                             antibiotics  Performed at [facility], [place] [address], [place], [place] [address]   Comprehensive metabolic panel     Status: Abnormal   Collection Time: 04/23/22  2:24 PM  Result Value Ref Range   Sodium 138 135 - 145 mmol/L   Potassium 3.8 3.5 - 5.1 mmol/L   Chloride 106 98 - 111 mmol/L   CO2 22 22 - 32 mmol/L   Glucose, Bld 155 (H) 70 - 99 mg/dL    Comment: Glucose reference range applies only to samples taken after fasting for at least 8 hours.   BUN 15 8 - 23 mg/dL   Creatinine, Ser 1.22 (H) 0.44 - 1.00 mg/dL   Calcium 9.0 8.9 - 10.3 mg/dL   Total Protein 6.6 6.5 - 8.1 g/dL   Albumin 3.4 (L) 3.5 - 5.0 g/dL   AST 25 15 - 41 U/L   ALT 12 0 - 44 U/L   Alkaline Phosphatase 60 38 - 126 U/L   Total Bilirubin 0.5 0.3 - 1.2 mg/dL   GFR, Estimated 45 (L) >60 mL/min    Comment: (NOTE) Calculated using the CKD-EPI Creatinine Equation (2021)    Anion gap 10 5 - 15    Comment: Performed at [facility] [address] [place], [place] [address]  CBC with Differential     Status: Abnormal   Collection Time: 04/23/22  2:24 PM  Result Value Ref Range   WBC 7.4 4.0 - 10.5 K/uL   RBC 3.95 3.87 - 5.11 MIL/uL   Hemoglobin 12.2 12.0 - 15.0 g/dL   HCT 35.3 (L) 36.0 - 46.0 %   MCV 89.4 80.0 - 100.0 fL   MCH 30.9 26.0 - 34.0 pg   MCHC 34.6 30.0 - 36.0 g/dL   RDW 12.8 11.5 - 15.5 %   Platelets 203 150 - 400 K/uL   nRBC 0.0 0.0 - 0.2 %   Neutrophils Relative %  87 %   Neutro Abs 6.4 1.7 - 7.7 K/uL   Lymphocytes Relative 8 %   Lymphs Abs 0.6 (L) 0.7 - 4.0 K/uL   Monocytes Relative 5 %   Monocytes Absolute 0.4 0.1 - 1.0 K/uL   Eosinophils Relative 0 %   Eosinophils Absolute 0.0 0.0 - 0.5 K/uL   Basophils Relative 0 %   Basophils Absolute 0.0 0.0 - 0.1 K/uL   Immature Granulocytes 0 %   Abs Immature Granulocytes 0.02 0.00 - 0.07 K/uL    Comment: Performed at [facility] [address], [place], [place] [address]  Urinalysis, Routine w reflex microscopic -Urine, Clean Catch     Status: Abnormal   Collection Time: 04/23/22  2:24 PM  Result Value Ref Range   Color, Urine YELLOW YELLOW   APPearance CLEAR CLEAR   Specific Gravity, Urine 1.010 1.005 - 1.030   pH 7.0 5.0 - 8.0   Glucose, UA NEGATIVE NEGATIVE mg/dL   Hgb urine dipstick SMALL (A) NEGATIVE  Bilirubin Urine NEGATIVE NEGATIVE   Ketones, ur NEGATIVE NEGATIVE mg/dL   Protein, ur NEGATIVE NEGATIVE mg/dL   Nitrite NEGATIVE NEGATIVE   Leukocytes,Ua NEGATIVE NEGATIVE    Comment: Performed at [facility] [address], [place], [place] [address]  Urinalysis, Microscopic (reflex)     Status: Abnormal   Collection Time: 04/23/22  2:24 PM  Result Value Ref Range   RBC / HPF 0-5 0 - 5 RBC/hpf   WBC, UA 0-5 0 - 5 WBC/hpf   Bacteria, UA RARE (A) NONE SEEN   Squamous Epithelial / HPF 0-5 0 - 5 /HPF   Mucus PRESENT    Urine-Other MICROSCOPIC EXAM PERFORMED ON UNCONCENTRATED URINE     Comment: LESS THAN 10 mL OF URINE SUBMITTED Performed at [facility], [place] [address] [place], [place] [address]   CK     Status: None   Collection Time: 04/23/22  2:59 PM  Result Value Ref Range   Total CK 112 38 - 234 U/L    Comment: Performed at [facility], [place] [address] [place], [place] [address]  Magnesium     Status: None   Collection Time: 04/23/22  2:59 PM  Result Value Ref Range   Magnesium 1.8 1.7 - 2.4 mg/dL    Comment: Performed at [facility], [place] [address] [place], [place] [address]  Resp panel by RT-PCR (RSV, Flu A&B, Covid) Anterior Nasal Swab     Status: Abnormal   Collection Time: 04/23/22  3:48 PM   Specimen: Anterior Nasal Swab  Result Value Ref Range   SARS Coronavirus 2 by RT PCR POSITIVE (A) NEGATIVE   Influenza A by PCR NEGATIVE NEGATIVE   Influenza B by PCR NEGATIVE NEGATIVE    Comment: (NOTE) The Xpert Xpress SARS-CoV-2/FLU/RSV plus assay is intended as an aid in the diagnosis of influenza from Nasopharyngeal swab specimens and should not be used as a sole basis for treatment. Nasal washings and aspirates are unacceptable for Xpert Xpress SARS-CoV-2/FLU/RSV testing.  Fact Sheet for Patients: EntrepreneurPulse.com.au  Fact Sheet for Healthcare Providers: IncredibleEmployment.be  This test is not yet approved or cleared by the [place] FDA and has been authorized for detection and/or diagnosis of SARS-CoV-2 by FDA under an Emergency Use Authorization (EUA). This EUA will remain in effect (meaning this test can be used) for the duration of the COVID-19 declaration under Section 564(b)(1) of the Act, 21 U.S.C. section 360bbb-3(b)(1), unless the authorization is terminated or revoked.     Resp Syncytial Virus by PCR NEGATIVE NEGATIVE    Comment: (NOTE) Fact Sheet for Patients: EntrepreneurPulse.com.au  Fact Sheet for Healthcare Providers: IncredibleEmployment.be  This test is not yet approved or cleared by the [place] FDA and has been authorized for detection and/or diagnosis of SARS-CoV-2 by FDA under an Emergency Use Authorization (EUA). This EUA will remain in effect (meaning this test can be used) for the duration of the COVID-19 declaration under Section 564(b)(1) of the Act, 21 U.S.C. section 360bbb-3(b)(1), unless the authorization is terminated or revoked.  Performed at [facility], [place] [address] [place],   [address]   Rapid urine drug screen (hospital performed)     Status: None   Collection Time: 04/23/22  4:38 PM  Result Value Ref Range   Opiates NONE DETECTED NONE DETECTED   Cocaine NONE DETECTED NONE DETECTED   Benzodiazepines NONE DETECTED NONE DETECTED   Amphetamines NONE DETECTED NONE DETECTED  Tetrahydrocannabinol NONE DETECTED NONE DETECTED   Barbiturates NONE DETECTED NONE DETECTED    Comment: (NOTE) DRUG SCREEN FOR MEDICAL PURPOSES ONLY.  IF CONFIRMATION IS NEEDED FOR ANY PURPOSE, NOTIFY LAB WITHIN 5 DAYS.  LOWEST DETECTABLE LIMITS FOR URINE DRUG SCREEN Drug Class                     Cutoff (ng/mL) Amphetamine and metabolites    1000 Barbiturate and metabolites    200 Benzodiazepine                 200 Opiates and metabolites        300 Cocaine and metabolites        300 THC                            50 Performed at [facility], [place] [address], [place], [place] [address]   Ethanol     Status: None   Collection Time: 04/24/22  4:40 AM  Result Value Ref Range   Alcohol, Ethyl (B) <10 <10 mg/dL    Comment: (NOTE) Lowest detectable limit for serum alcohol is 10 mg/dL.  For medical purposes only. Performed at [facility], [place] [address] [place] [address], [place], [place] [address]   CBC with Differential/Platelet     Status: Abnormal   Collection Time: 04/24/22  4:40 AM  Result Value Ref Range   WBC 6.7 4.0 - 10.5 K/uL   RBC 3.67 (L) 3.87 - 5.11 MIL/uL   Hemoglobin 11.5 (L) 12.0 - 15.0 g/dL   HCT 32.4 (L) 36.0 - 46.0 %   MCV 88.3 80.0 - 100.0 fL   MCH 31.3 26.0 - 34.0 pg   MCHC 35.5 30.0 - 36.0 g/dL   RDW 13.0 11.5 - 15.5 %   Platelets 191 150 - 400 K/uL   nRBC 0.0 0.0 - 0.2 %   Neutrophils Relative % 72 %   Neutro Abs 4.8 1.7 - 7.7 K/uL   Lymphocytes Relative 19 %   Lymphs Abs 1.2 0.7 - 4.0 K/uL   Monocytes Relative 9 %   Monocytes Absolute 0.6 0.1 - 1.0 K/uL   Eosinophils Relative 0 %   Eosinophils Absolute 0.0 0.0 - 0.5 K/uL   Basophils Relative 0 %    Basophils Absolute 0.0 0.0 - 0.1 K/uL   Immature Granulocytes 0 %   Abs Immature Granulocytes 0.01 0.00 - 0.07 K/uL    Comment: Performed at [facility], [address] [place], [place] [address]  Comprehensive metabolic panel     Status: Abnormal   Collection Time: 04/24/22  4:40 AM  Result Value Ref Range   Sodium 137 135 - 145 mmol/L   Potassium 3.5 3.5 - 5.1 mmol/L   Chloride 105 98 - 111 mmol/L   CO2 19 (L) 22 - 32 mmol/L   Glucose, Bld 110 (H) 70 - 99 mg/dL    Comment: Glucose reference range applies only to samples taken after fasting for at least 8 hours.   BUN 16 8 - 23 mg/dL   Creatinine, Ser 1.21 (H) 0.44 - 1.00 mg/dL   Calcium 8.9 8.9 - 10.3 mg/dL   Total Protein 6.0 (L) 6.5 - 8.1 g/dL   Albumin 3.1 (L) 3.5 - 5.0 g/dL   AST 27 15 - 41 U/L   ALT 13 0 - 44 U/L   Alkaline Phosphatase 48 38 - 126 U/L  Total Bilirubin 0.7 0.3 - 1.2 mg/dL   GFR, Estimated 45 (L) >60 mL/min    Comment: (NOTE) Calculated using the CKD-EPI Creatinine Equation (2021)    Anion gap 13 5 - 15    Comment: Performed at [facility] [address] [place], [place] [address]  Magnesium     Status: None   Collection Time: 04/24/22  4:40 AM  Result Value Ref Range   Magnesium 1.8 1.7 - 2.4 mg/dL    Comment: Performed at [facility] [address], [place], [place] [address]  Phosphorus     Status: None   Collection Time: 04/24/22  4:40 AM  Result Value Ref Range   Phosphorus 2.5 2.5 - 4.6 mg/dL    Comment: Performed at [facility] [address], [place], [place] [address]  C-reactive protein     Status: Abnormal   Collection Time: 04/24/22  4:40 AM  Result Value Ref Range   CRP 4.2 (H) <1.0 mg/dL    Comment: Performed at [facility] [address], [place], [place] [address]   ECHOCARDIOGRAM COMPLETE  Result Date: 04/24/2022    ECHOCARDIOGRAM REPORT   Patient Name:   NICKITA KUENZI [facility] Date of Exam: 04/24/2022 Medical Rec #:  [id number]     Height:        60.0 in Accession #:    [id number]    Weight:       129.0 lb Date of Birth:  31-Oct-1940      BSA:          1.549 m Patient Age:    58 years      BP:           143/89 mmHg Patient Gender: F             HR:           78 bpm. Exam Location:  Inpatient Procedure: 2D Echo, Cardiac Doppler and Color Doppler Indications:    syncope  History:        Patient has prior history of Echocardiogram examinations, most                 recent 08/20/2021. Acsending aortic aneurysm and CHF, Covid.;                 Risk Factors:Hypertension and Dyslipidemia.  Sonographer:    Johny Chess RDCS Referring Phys: [place]  Sonographer Comments: Image acquisition challenging due to uncooperative patient and Image acquisition challenging due to patient behavioral factors. IMPRESSIONS  1. Left ventricular ejection fraction, by estimation, is 70 to 75%. The left ventricle has hyperdynamic function. The left ventricle has no regional wall motion abnormalities. Left ventricular diastolic function could not be evaluated.  2. Right ventricular systolic function is normal. The right ventricular size is normal. There is normal pulmonary artery systolic pressure. The estimated right ventricular systolic pressure is [id number] mmHg.  3. The mitral valve is normal in structure. Trivial mitral valve regurgitation. No evidence of mitral stenosis.  4. The aortic valve is tricuspid. There is moderate calcification of the aortic valve. There is moderate thickening of the aortic valve. Aortic valve regurgitation is trivial. Aortic valve sclerosis/calcification is present, without any evidence of aortic stenosis. Aortic valve area, by VTI measures 2.23 cm. Aortic valve mean gradient measures 12.0 mmHg. Aortic valve Vmax measures 2.34 m/s.  5. The inferior vena cava is normal in size with greater than 50% respiratory variability,  suggesting right atrial pressure of 3 mmHg. FINDINGS  Left Ventricle: Left ventricular ejection fraction, by estimation,  is 70 to 75%. The left ventricle has hyperdynamic function. The left ventricle has no regional wall motion abnormalities. The left ventricular internal cavity size was normal in size. There is no left ventricular hypertrophy. Left ventricular diastolic function could not be evaluated. Right Ventricle: The right ventricular size is normal. No increase in right ventricular wall thickness. Right ventricular systolic function is normal. There is normal pulmonary artery systolic pressure. The tricuspid regurgitant velocity is 1.75 m/s, and  with an assumed right atrial pressure of 3 mmHg, the estimated right ventricular systolic pressure is [id number] mmHg. Left Atrium: Left atrial size was normal in size. Right Atrium: Right atrial size was normal in size. Pericardium: There is no evidence of pericardial effusion. Mitral Valve: The mitral valve is normal in structure. Mild mitral annular calcification. Trivial mitral valve regurgitation. No evidence of mitral valve stenosis. Tricuspid Valve: The tricuspid valve is normal in structure. Tricuspid valve regurgitation is mild . No evidence of tricuspid stenosis. Aortic Valve: The aortic valve is tricuspid. There is moderate calcification of the aortic valve. There is moderate thickening of the aortic valve. Aortic valve regurgitation is trivial. Aortic valve sclerosis/calcification is present, without any evidence of aortic stenosis. Aortic valve mean gradient measures 12.0 mmHg. Aortic valve peak gradient measures 21.9 mmHg. Aortic valve area, by VTI measures 2.23 cm. Pulmonic Valve: The pulmonic valve was normal in structure. Pulmonic valve regurgitation is not visualized. No evidence of pulmonic stenosis. Aorta: The aortic root is normal in size and structure. Venous: The inferior vena cava is normal in size with greater than 50% respiratory variability, suggesting right atrial pressure of 3 mmHg. IAS/Shunts: No atrial level shunt detected by color flow Doppler.  LEFT  VENTRICLE PLAX 2D LVIDd:         3.60 cm   Diastology LVIDs:         2.20 cm   LV e' medial:  4.57 cm/s LV PW:         1.00 cm   LV e' lateral: 4.90 cm/s LV IVS:        0.80 cm LVOT diam:     1.90 cm LV SV:         117 LV SV Index:   75 LVOT Area:     2.84 cm  RIGHT VENTRICLE             IVC RV Basal diam:  2.40 cm     IVC diam: 1.10 cm RV S prime:     10.90 cm/s TAPSE (M-mode): 1.8 cm LEFT ATRIUM             Index        RIGHT ATRIUM          Index LA diam:        3.50 cm 2.26 cm/m   RA Area:     9.31 cm LA Vol (A2C):   20.5 ml 13.23 ml/m  RA Volume:   17.50 ml 11.30 ml/m LA Vol (A4C):   35.0 ml 22.59 ml/m LA Biplane Vol: 28.0 ml 18.07 ml/m  AORTIC VALVE AV Area (Vmax):    2.48 cm AV Area (Vmean):   2.57 cm AV Area (VTI):     2.23 cm AV Vmax:           234.00 cm/s AV Vmean:          162.000 cm/s AV VTI:  0.523 m AV Peak Grad:      21.9 mmHg AV Mean Grad:      12.0 mmHg LVOT Vmax:         205.00 cm/s LVOT Vmean:        147.000 cm/s LVOT VTI:          0.411 m LVOT/AV VTI ratio: 0.79  AORTA Ao Root diam: 3.30 cm TRICUSPID VALVE TR Peak grad:   12.2 mmHg TR Vmax:        175.00 cm/s  SHUNTS Systemic VTI:  0.41 m Systemic Diam: 1.90 cm Fransico Him MD Electronically signed by Fransico Him MD Signature Date/Time: 04/24/2022/11:01:56 AM    Final    CT L-SPINE NO CHARGE  Result Date: 04/23/2022 CLINICAL DATA:  Seizure.  Back pain. EXAM: CT LUMBAR SPINE WITHOUT CONTRAST TECHNIQUE: Multidetector CT imaging of the lumbar spine was performed without intravenous contrast administration. Multiplanar CT image reconstructions were also generated. RADIATION DOSE REDUCTION: This exam was performed according to the departmental dose-optimization program which includes automated exposure control, adjustment of the [place] Age and/or kV according to patient size and/or use of iterative reconstruction technique. COMPARISON:  Lateral chest radiograph 06/05/2016 FINDINGS: Segmentation: 5 lumbar type vertebral bodies.  Alignment: Thoracolumbar curvature convex to the right and lower lumbar curvature convex to the left. Vertebrae: Compression deformity at T12 that I think it is probably old. Loss of height anteriorly of 40%. No retropulsed bone. No other regional bone injury. Paraspinal and other soft tissues: Negative other than aortic atherosclerosis. Disc levels: No significant disc level pathology at L1-2 or above. L2-3: Endplate osteophytes and bulging of the disc more prominent towards the left. Narrowing of the left lateral recess and intervertebral foramen on the left that could cause left-sided neural compression. L3-4: Bulging of the disc. Mild narrowing of both lateral recesses without definite neural compression. L4-5: Bulging of the disc. Mild narrowing of the lateral recesses without likely neural compression. L5-S1: No disc abnormality.  Mild facet osteoarthritis.  No stenosis IMPRESSION: 1. Probably old compression fracture at T12 with loss of height anteriorly of 40%. No retropulsed bone. 2. L2-3: Endplate osteophytes and bulging of the disc more prominent towards the left. Narrowing of the left lateral recess and intervertebral foramen on the left that could cause left-sided neural compression. 3. L3-4: Bulging of the disc. Mild narrowing of both lateral recesses without likely neural compression. 4. L4-5: Bulging of the disc. Mild narrowing of the lateral recesses without likely neural compression. 5. L5-S1: No disc abnormality. Mild facet osteoarthritis. No stenosis. 6. Aortic atherosclerosis. Aortic Atherosclerosis (ICD10-I70.0). Electronically Signed   By: Nelson Chimes M.D.   On: 04/23/2022 20:52   CT CHEST ABDOMEN PELVIS WO CONTRAST  Result Date: 04/23/2022 CLINICAL DATA:  Seizure.  Abdominal pain. EXAM: CT CHEST, ABDOMEN AND PELVIS WITHOUT CONTRAST TECHNIQUE: Multidetector CT imaging of the chest, abdomen and pelvis was performed following the standard protocol without IV contrast. RADIATION DOSE  REDUCTION: This exam was performed according to the departmental dose-optimization program which includes automated exposure control, adjustment of the [place] and/or kV according to patient size and/or use of iterative reconstruction technique. COMPARISON:  None Available. FINDINGS: CT CHEST FINDINGS Cardiovascular: There is mild aneurysmal dilatation of the ascending aorta measuring 4 cm. Heart is normal in size. There is no pericardial effusion. There are atherosclerotic calcifications of the aorta. Mediastinum/Nodes: There is a hypodense left thyroid nodule measuring 8 mm. Bilateral thyroid calcifications are present. There are no enlarged mediastinal or hilar lymph nodes.  There is a small hiatal hernia. The esophagus is within normal limits as visualized. Lungs/Pleura: There is some linear scarring or atelectasis in the left lung apex. Mild emphysematous changes are present. Lungs are otherwise clear. There is no pleural effusion or pneumothorax. Musculoskeletal: There is moderate chronic compression deformity of T12. No acute fractures are seen. CT ABDOMEN PELVIS FINDINGS Hepatobiliary: Small gallstones are present. There is no biliary ductal dilatation. No focal liver lesions are seen. Pancreas: Unremarkable. No pancreatic ductal dilatation or surrounding inflammatory changes. Spleen: Normal in size without focal abnormality. Adrenals/Urinary Tract: There is an air-fluid level in the bladder. Kidneys and adrenal glands are within normal limits. Stomach/Bowel: Stomach is within normal limits. Appendix appears normal. No evidence of bowel wall thickening, distention, or inflammatory changes. Vascular/Lymphatic: Aortic atherosclerosis. No enlarged abdominal or pelvic lymph nodes. Reproductive: Status post hysterectomy. No adnexal masses. Other: No abdominal wall hernia or abnormality. No abdominopelvic ascites. Musculoskeletal: No acute osseous abnormality. Extravasated contrast noted in the right forearm.  IMPRESSION: 1. Air-fluid level in the bladder. Correlate clinically for cystitis. 2. No acute localizing process in the chest, abdomen or pelvis. 3. Cholelithiasis. 4. Ascending aortic aneurysm measuring 4 cm. Recommend annual imaging followup by CTA or MRA. This recommendation follows 2010 ACCF/AHA/AATS/ACR/ASA/SCA/SCAI/SIR/STS/SVM Guidelines for the Diagnosis and Management of Patients with Thoracic Aortic Disease. Circulation. 2010; [id number]. Aortic aneurysm NOS (ICD10-I71.9) 5. Subcentimeter incidental left thyroid nodule. No follow-up imaging is recommended. Reference: J Am Coll Radiol. 2015 Feb;12(2): 143-50 6. Extravasated contrast in the right forearm. Aortic Atherosclerosis (ICD10-I70.0). Electronically Signed   By: Ronney Asters M.D.   On: 04/23/2022 19:54   CT Cervical Spine Wo Contrast  Result Date: 04/23/2022 CLINICAL DATA:  Seizure.  Neck trauma. EXAM: CT CERVICAL SPINE WITHOUT CONTRAST TECHNIQUE: Multidetector CT imaging of the cervical spine was performed without intravenous contrast. Multiplanar CT image reconstructions were also generated. RADIATION DOSE REDUCTION: This exam was performed according to the departmental dose-optimization program which includes automated exposure control, adjustment of the Jonathon Tan and/or kV according to patient size and/or use of iterative reconstruction technique. COMPARISON:  07/20/2021 FINDINGS: Alignment: No malalignment. Skull base and vertebrae: No fracture or focal bone lesion. Soft tissues and spinal canal: No significant soft tissue finding. 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 Other levels negative. Upper chest: Scarring and or atelectasis in the left upper lobe. Other: None IMPRESSION: No acute or traumatic finding. 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 Electronically Signed   By: Nelson Chimes M.D.    On: 04/23/2022 19:50   CT Head Wo Contrast  Result Date: 04/23/2022 CLINICAL DATA:  New onset seizures. EXAM: CT HEAD WITHOUT CONTRAST TECHNIQUE: Contiguous axial images were obtained from the base of the skull through the vertex without intravenous contrast. RADIATION DOSE REDUCTION: This exam was performed according to the departmental dose-optimization program which includes automated exposure control, adjustment of the Pilar Westergaard and/or kV according to patient size and/or use of iterative reconstruction technique. COMPARISON:  Jul 20, 2021 FINDINGS: Brain: There is mild cerebral atrophy with widening of the extra-axial spaces and ventricular dilatation. There are areas of decreased attenuation within the white matter tracts of the supratentorial brain, consistent with microvascular disease changes. Vascular: There is marked severity calcification of the bilateral cavernous carotid arteries. Skull: Normal. Negative for fracture or focal lesion. Sinuses/Orbits: Mild ethmoid sinus mucosal thickening is seen. A  small chronic versus postoperative defect is seen involving the medial wall of the left maxillary sinus. Other: None. IMPRESSION: 1. No acute intracranial abnormality. 2. Generalized cerebral atrophy with widening of the extra-axial spaces and ventricular dilatation. Electronically Signed   By: Virgina Norfolk M.D.   On: 04/23/2022 19:48   DG Chest 1 View  Result Date: 04/23/2022 CLINICAL DATA:  Seizure EXAM: CHEST  1 VIEW COMPARISON:  08/19/2021 x-ray and older FINDINGS: Hyperinflation. No consolidation, pneumothorax or effusion. Normal cardiopericardial silhouette without edema. Calcified aorta. IMPRESSION: No acute cardiopulmonary disease Electronically Signed   By: Jill Side M.D.   On: 04/23/2022 15:25    Pending Labs Unresulted Labs (From admission, onward)    None       Vitals/Pain Today's Vitals   04/24/22 0412 04/24/22 0552 04/24/22 0631 04/24/22 0905  BP:    119/61  Pulse:    87   Resp:    12  Temp:  (!) 100.4 F (38 C)  97.8 F (36.6 C)  TempSrc:  Axillary  Axillary  SpO2:    98%  Weight:      Height:      PainSc: Asleep  0-No pain     Isolation Precautions Airborne and Contact precautions  Medications Medications  acetaminophen (TYLENOL) tablet 650 mg ( Oral See Alternative 04/24/22 0553)    Or  acetaminophen (TYLENOL) suppository 650 mg (650 mg Rectal Given 04/24/22 0553)  melatonin tablet 3 mg (has no administration in time range)  benzonatate (TESSALON) capsule 200 mg (has no administration in time range)  albuterol (PROVENTIL) (2.5 MG/3ML) 0.083% nebulizer solution 2.5 mg (has no administration in time range)  nirmatrelvir/ritonavir (renal dosing) (PAXLOVID) 2 tablet (2 tablets Oral Not Given 04/24/22 1119)  sodium chloride 0.9 % bolus 1,000 mL (0 mLs Intravenous Stopped 04/23/22 1950)  diazepam (VALIUM) injection 2.5 mg (2.5 mg Intravenous Given 04/23/22 1904)  iohexol (OMNIPAQUE) 350 MG/ML injection 60 mL (60 mLs Intravenous Contrast Given 04/23/22 1928)  magnesium sulfate IVPB 1 g 100 mL (0 g Intravenous Stopped 04/24/22 0409)    Mobility non-ambulatory     Focused Assessments Neuro Assessment Handoff:  Swallow screen pass? Yes          Neuro Assessment:   Neuro Checks:      Has TPA been given? No If patient is a Neuro Trauma and patient is going to OR before floor call report to [facility] nurse: [phone] or [phone]   R Recommendations: See Admitting Provider Note  Report given to:   Additional Notes: Hx of dementia. Unable to follow commands. Daughter is at bedside translating. Uses a purewick. Does not get out of bed. Pt has been asleep mostly since arrival of this RN today. Easy to arouse and moves around bed but unable to follow commands.

## 2022-04-25 DIAGNOSIS — I10 Essential (primary) hypertension: Secondary | ICD-10-CM | POA: Diagnosis not present

## 2022-04-25 DIAGNOSIS — R55 Syncope and collapse: Secondary | ICD-10-CM | POA: Diagnosis not present

## 2022-04-25 DIAGNOSIS — E860 Dehydration: Secondary | ICD-10-CM | POA: Diagnosis not present

## 2022-04-25 DIAGNOSIS — U071 COVID-19: Secondary | ICD-10-CM | POA: Diagnosis not present

## 2022-04-25 DIAGNOSIS — I7121 Aneurysm of the ascending aorta, without rupture: Secondary | ICD-10-CM | POA: Diagnosis not present

## 2022-04-25 MED ORDER — NIRMATRELVIR/RITONAVIR (PAXLOVID) TABLET (RENAL DOSING): 2.0000 | ORAL_TABLET | Freq: Two times a day (BID) | ORAL | 0 refills | Status: AC

## 2022-04-25 NOTE — Discharge Instructions (Addendum)

## 2022-04-25 NOTE — TOC Transition Note (Addendum)
Transition of Care [facility]) - CM/SW Discharge Note   Patient Details  Name: Kara Dennis MRN: [id number] Date of Birth: June 07, 1940  [facility] CM/SW Contact:  Levonne Lapping, RN Phone Number: 04/25/2022, 11:34 AM   Clinical Narrative:     CM met with Patient and Daughter in room.    Daughter "Grayland Ormond" states that Patient lives with her on the weekends at the [employer] address listed in chart.[phone]    During the week,patient stays with her Son, "Cleopatra Cedar" [address] [place], [place]  [address]  His number is [phone]     The following DME is recommended and will be provided by [employer] :  BSC and Lightweight Wheelchair to be delivered to room 5W 36 prior to DC .  Hospital Bed and [place] to be delivered to [facility] [address] [place],  [address] on Friday or Saturday-  Son's name is Cleopatra Cedar and speaks [place]. [employer] has been instructed to contact the Daughter, Waymond Cera at [phone] to arrange for her to meet and assist with translating. The Daughter will also need to be present for delivery to receive instructions on hoyer lift usage.   Daughter stated that Patient can go home with [facility] and wheelchair but they are ok to wait a day or two for Hospital Bed. She does not want to hold up DC.  Daughter stated that her Husband will come here to hospital and will assist getting patient in Daughter's car to transport home. He will also help get her in the home. Daughter insists that this is what they always do and there is no need for ambulance transport.    Patient may leave when [facility] and wheelchair are delivered to room   No additional TOC needs        Barriers to Discharge: Continued Medical Work up   Patient Goals and CMS Choice      Discharge Placement                         Discharge Plan and Services Additional resources added to the After Visit Summary for   In-house Referral: Interpreting Services Discharge Planning  Services: CM Consult Post Acute Care Choice: Durable Medical Equipment, Home Health          DME Arranged: Bedside commode DME Agency: [employer] Date DME Agency Contacted: 04/25/22 Time DME Agency Contacted: [phone] Representative spoke with at DME Agency: [place] Determinants of Health ([place]) Interventions SDOH Screenings   Depression (PHQ2-9): Low Risk  (01/05/2019)  Tobacco Use: Low Risk  (04/24/2022)     Readmission Risk Interventions     No data to display

## 2022-04-25 NOTE — Discharge Summary (Signed)
PATIENT DETAILS Name: Kara Dennis Age: 82 y.o. Sex: female Date of Birth: Sep 02, 1940 MRN: [id number]. Admitting Physician: Rhetta Mura, DO [id number]  Admit Date: 04/23/2022 Discharge date: 04/25/2022  Recommendations for Outpatient Follow-up:  Follow up with PCP in 1-2 weeks Please obtain CMP/CBC in one week Needs annual imaging with CT/MR for 4 cam ascending aortic aneurysm  Admitted From:  Home  Disposition: Home health   Discharge Condition: fair  CODE STATUS:   Code Status: Full Code   Diet recommendation:  Diet Order             Diet regular Room service appropriate? No; Fluid consistency: Thin  Diet effective now           Diet - low sodium heart healthy                    Brief Summary: Patient is a 82 y.o.  female with history of chronic HFpEF, HTN, dementia-who presented with generalized weakness, cough, syncope (with coughing spells)-she was found to have COVID-19 infection admitted to the hospitalist service.   Significant events: 2/13>> admit to [facility]   Significant studies: 2/13>> CXR: No PNA 2/13>> CT head: No acute intracranial abnormality 2/13>> CT C-spine: No fracture/traumatic finding 2/13>> CT L-spine: Old compression fracture T12 2/13>> CT chest/abdomen/pelvis: No acute process in the chest/abdomen/pelvis 2/14>> echo: EF 70-75%   Significant microbiology data: 2/13>> COVID PCR: Positive 2/13>> influenza/RSV PCR: Negative   Procedures: None   Consults: None  Brief Hospital Course: COVID-19 infection No PNA on imaging studies Not hypoxic Continue Paxlovid   Syncope Likely vasovagal-as this occurred in the setting of coughing spells. Telemetry negative for arrhythmias Echo with stable EF   Debility/deconditioning Appears to be worsening-this is in the setting of COVID-19 infection.  Appears to have some amount of debility at baseline-needs assistant with ambulation per daughter at [place] ordered Family will provide 24/7 supervision   HTN BP appears relatively stable without the use of any antihypertensives Continue dietary/lifestyle modification Not on any antihypertensives at home   Dementia Supportive care.   4 cm ascending aortic aneurysm: Radiology recommending annual imaging by CTA/MRA   Subcentimeter incidental left thyroid nodule Per radiology-no follow-up is recommended   BMI: Estimated body mass index is 25.19 kg/m as calculated from the following:   Height as of this encounter: 5' (1.524 m).   Weight as of this encounter: 58.5 kg.   Discharge Diagnoses:  Principal Problem:   COVID-19 virus infection Active Problems:   Syncope   Essential hypertension   Cough   Ascending aortic aneurysm (HCC)   Chronic diastolic CHF (congestive heart failure) ([place])   Discharge Instructions:  Activity:  As tolerated with Full fall precautions use walker/cane & assistance as needed  Discharge Instructions     Call MD for:  difficulty breathing, headache or visual disturbances   Complete by: As directed    Diet - low sodium heart healthy   Complete by: As directed    Increase activity slowly   Complete by: As directed       Allergies as of 04/25/2022       Reactions   Hydrochlorothiazide    Feet Swelling        Medication List     TAKE these medications    alendronate 70 MG tablet Commonly known as: FOSAMAX Take 70 mg by mouth once a week.   cetirizine 10 MG tablet Commonly known  as: ZYRTEC Take 10 mg by mouth daily.   nirmatrelvir/ritonavir (renal dosing) 10 x 150 MG & 10 x 100MG Tabs Commonly known as: PAXLOVID Take 2 tablets by mouth 2 (two) times daily for 5 days. Take nirmatrelvir (150 mg) one tablet twice daily for 5 days and ritonavir (100 mg) one tablet twice daily for 5 days.   traZODone 100 MG tablet Commonly known as: DESYREL Take 100 mg by mouth at bedtime.   Vitamin D (Ergocalciferol) 1.25 MG (50000  UNIT) Caps capsule Commonly known as: DRISDOL Take 1 capsule (50,000 Units total) by mouth every 7 (seven) days.        Follow-up [place].. Schedule an appointment as soon as possible for a visit in 1 week(s).   Contact information: [place] [address] [phone]                Allergies  Allergen Reactions   Hydrochlorothiazide     Feet Swelling     Other Procedures/Studies: ECHOCARDIOGRAM COMPLETE  Result Date: 04/24/2022    ECHOCARDIOGRAM REPORT   Patient Name:   Kara Dennis [facility] Date of Exam: 04/24/2022 Medical Rec #:  [id number]     Height:       60.0 in Accession #:    [id number]    Weight:       129.0 lb Date of Birth:  April 14, 1940      BSA:          1.549 m Patient Age:    17 years      BP:           143/89 mmHg Patient Gender: F             HR:           78 bpm. Exam Location:  Inpatient Procedure: 2D Echo, Cardiac Doppler and Color Doppler Indications:    syncope  History:        Patient has prior history of Echocardiogram examinations, most                 recent 08/20/2021. Acsending aortic aneurysm and CHF, Covid.;                 Risk Factors:Hypertension and Dyslipidemia.  Sonographer:    Johny Chess RDCS Referring Phys: [place]  Sonographer Comments: Image acquisition challenging due to uncooperative patient and Image acquisition challenging due to patient behavioral factors. IMPRESSIONS  1. Left ventricular ejection fraction, by estimation, is 70 to 75%. The left ventricle has hyperdynamic function. The left ventricle has no regional wall motion abnormalities. Left ventricular diastolic function could not be evaluated.  2. Right ventricular systolic function is normal. The right ventricular size is normal. There is normal pulmonary artery systolic pressure. The estimated right ventricular systolic pressure is [id number] mmHg.  3. The mitral valve is normal in structure. Trivial mitral valve  regurgitation. No evidence of mitral stenosis.  4. The aortic valve is tricuspid. There is moderate calcification of the aortic valve. There is moderate thickening of the aortic valve. Aortic valve regurgitation is trivial. Aortic valve sclerosis/calcification is present, without any evidence of aortic stenosis. Aortic valve area, by VTI measures 2.23 cm. Aortic valve mean gradient measures 12.0 mmHg. Aortic valve Vmax measures 2.34 m/s.  5. The inferior vena cava is normal in size with greater than 50% respiratory variability, suggesting right atrial pressure of 3 mmHg. FINDINGS  Left Ventricle: Left  ventricular ejection fraction, by estimation, is 70 to 75%. The left ventricle has hyperdynamic function. The left ventricle has no regional wall motion abnormalities. The left ventricular internal cavity size was normal in size. There is no left ventricular hypertrophy. Left ventricular diastolic function could not be evaluated. Right Ventricle: The right ventricular size is normal. No increase in right ventricular wall thickness. Right ventricular systolic function is normal. There is normal pulmonary artery systolic pressure. The tricuspid regurgitant velocity is 1.75 m/s, and  with an assumed right atrial pressure of 3 mmHg, the estimated right ventricular systolic pressure is [id number] mmHg. Left Atrium: Left atrial size was normal in size. Right Atrium: Right atrial size was normal in size. Pericardium: There is no evidence of pericardial effusion. Mitral Valve: The mitral valve is normal in structure. Mild mitral annular calcification. Trivial mitral valve regurgitation. No evidence of mitral valve stenosis. Tricuspid Valve: The tricuspid valve is normal in structure. Tricuspid valve regurgitation is mild . No evidence of tricuspid stenosis. Aortic Valve: The aortic valve is tricuspid. There is moderate calcification of the aortic valve. There is moderate thickening of the aortic valve. Aortic valve regurgitation is  trivial. Aortic valve sclerosis/calcification is present, without any evidence of aortic stenosis. Aortic valve mean gradient measures 12.0 mmHg. Aortic valve peak gradient measures 21.9 mmHg. Aortic valve area, by VTI measures 2.23 cm. Pulmonic Valve: The pulmonic valve was normal in structure. Pulmonic valve regurgitation is not visualized. No evidence of pulmonic stenosis. Aorta: The aortic root is normal in size and structure. Venous: The inferior vena cava is normal in size with greater than 50% respiratory variability, suggesting right atrial pressure of 3 mmHg. IAS/Shunts: No atrial level shunt detected by color flow Doppler.  LEFT VENTRICLE PLAX 2D LVIDd:         3.60 cm   Diastology LVIDs:         2.20 cm   LV e' medial:  4.57 cm/s LV PW:         1.00 cm   LV e' lateral: 4.90 cm/s LV IVS:        0.80 cm LVOT diam:     1.90 cm LV SV:         117 LV SV Index:   75 LVOT Area:     2.84 cm  RIGHT VENTRICLE             IVC RV Basal diam:  2.40 cm     IVC diam: 1.10 cm RV S prime:     10.90 cm/s TAPSE (M-mode): 1.8 cm LEFT ATRIUM             Index        RIGHT ATRIUM          Index LA diam:        3.50 cm 2.26 cm/m   RA Area:     9.31 cm LA Vol (A2C):   20.5 ml 13.23 ml/m  RA Volume:   17.50 ml 11.30 ml/m LA Vol (A4C):   35.0 ml 22.59 ml/m LA Biplane Vol: 28.0 ml 18.07 ml/m  AORTIC VALVE AV Area (Vmax):    2.48 cm AV Area (Vmean):   2.57 cm AV Area (VTI):     2.23 cm AV Vmax:           234.00 cm/s AV Vmean:          162.000 cm/s AV VTI:            0.523 m  AV Peak Grad:      21.9 mmHg AV Mean Grad:      12.0 mmHg LVOT Vmax:         205.00 cm/s LVOT Vmean:        147.000 cm/s LVOT VTI:          0.411 m LVOT/AV VTI ratio: 0.79  AORTA Ao Root diam: 3.30 cm TRICUSPID VALVE TR Peak grad:   12.2 mmHg TR Vmax:        175.00 cm/s  SHUNTS Systemic VTI:  0.41 m Systemic Diam: 1.90 cm Fransico Him MD Electronically signed by Fransico Him MD Signature Date/Time: 04/24/2022/11:01:56 AM    Final    CT L-SPINE NO  CHARGE  Result Date: 04/23/2022 CLINICAL DATA:  Seizure.  Back pain. EXAM: CT LUMBAR SPINE WITHOUT CONTRAST TECHNIQUE: Multidetector CT imaging of the lumbar spine was performed without intravenous contrast administration. Multiplanar CT image reconstructions were also generated. RADIATION DOSE REDUCTION: This exam was performed according to the departmental dose-optimization program which includes automated exposure control, adjustment of the mA and/or kV according to patient size and/or use of iterative reconstruction technique. COMPARISON:  Lateral chest radiograph 06/05/2016 FINDINGS: Segmentation: 5 lumbar type vertebral bodies. Alignment: Thoracolumbar curvature convex to the right and lower lumbar curvature convex to the left. Vertebrae: Compression deformity at T12 that I think it is probably old. Loss of height anteriorly of 40%. No retropulsed bone. No other regional bone injury. Paraspinal and other soft tissues: Negative other than aortic atherosclerosis. Disc levels: No significant disc level pathology at L1-2 or above. L2-3: Endplate osteophytes and bulging of the disc more prominent towards the left. Narrowing of the left lateral recess and intervertebral foramen on the left that could cause left-sided neural compression. L3-4: Bulging of the disc. Mild narrowing of both lateral recesses without definite neural compression. L4-5: Bulging of the disc. Mild narrowing of the lateral recesses without likely neural compression. L5-S1: No disc abnormality.  Mild facet osteoarthritis.  No stenosis IMPRESSION: 1. Probably old compression fracture at T12 with loss of height anteriorly of 40%. No retropulsed bone. 2. L2-3: Endplate osteophytes and bulging of the disc more prominent towards the left. Narrowing of the left lateral recess and intervertebral foramen on the left that could cause left-sided neural compression. 3. L3-4: Bulging of the disc. Mild narrowing of both lateral recesses without likely  neural compression. 4. L4-5: Bulging of the disc. Mild narrowing of the lateral recesses without likely neural compression. 5. L5-S1: No disc abnormality. Mild facet osteoarthritis. No stenosis. 6. Aortic atherosclerosis. Aortic Atherosclerosis (ICD10-I70.0). Electronically Signed   By: Nelson Chimes M.D.   On: 04/23/2022 20:52   CT CHEST ABDOMEN PELVIS WO CONTRAST  Result Date: 04/23/2022 CLINICAL DATA:  Seizure.  Abdominal pain. EXAM: CT CHEST, ABDOMEN AND PELVIS WITHOUT CONTRAST TECHNIQUE: Multidetector CT imaging of the chest, abdomen and pelvis was performed following the standard protocol without IV contrast. RADIATION DOSE REDUCTION: This exam was performed according to the departmental dose-optimization program which includes automated exposure control, adjustment of the mA and/or kV according to patient size and/or use of iterative reconstruction technique. COMPARISON:  None Available. FINDINGS: CT CHEST FINDINGS Cardiovascular: There is mild aneurysmal dilatation of the ascending aorta measuring 4 cm. Heart is normal in size. There is no pericardial effusion. There are atherosclerotic calcifications of the aorta. Mediastinum/Nodes: There is a hypodense left thyroid nodule measuring 8 mm. Bilateral thyroid calcifications are present. There are no enlarged mediastinal or hilar lymph nodes. There is  a small hiatal hernia. The esophagus is within normal limits as visualized. Lungs/Pleura: There is some linear scarring or atelectasis in the left lung apex. Mild emphysematous changes are present. Lungs are otherwise clear. There is no pleural effusion or pneumothorax. Musculoskeletal: There is moderate chronic compression deformity of T12. No acute fractures are seen. CT ABDOMEN PELVIS FINDINGS Hepatobiliary: Small gallstones are present. There is no biliary ductal dilatation. No focal liver lesions are seen. Pancreas: Unremarkable. No pancreatic ductal dilatation or surrounding inflammatory changes. Spleen:  Normal in size without focal abnormality. Adrenals/Urinary Tract: There is an air-fluid level in the bladder. Kidneys and adrenal glands are within normal limits. Stomach/Bowel: Stomach is within normal limits. Appendix appears normal. No evidence of bowel wall thickening, distention, or inflammatory changes. Vascular/Lymphatic: Aortic atherosclerosis. No enlarged abdominal or pelvic lymph nodes. Reproductive: Status post hysterectomy. No adnexal masses. Other: No abdominal wall hernia or abnormality. No abdominopelvic ascites. Musculoskeletal: No acute osseous abnormality. Extravasated contrast noted in the right forearm. IMPRESSION: 1. Air-fluid level in the bladder. Correlate clinically for cystitis. 2. No acute localizing process in the chest, abdomen or pelvis. 3. Cholelithiasis. 4. Ascending aortic aneurysm measuring 4 cm. Recommend annual imaging followup by CTA or MRA. This recommendation follows 2010 ACCF/AHA/AATS/ACR/ASA/SCA/SCAI/SIR/STS/SVM Guidelines for the Diagnosis and Management of Patients with Thoracic Aortic Disease. Circulation. 2010; [id number]. Aortic aneurysm NOS (ICD10-I71.9) 5. Subcentimeter incidental left thyroid nodule. No follow-up imaging is recommended. Reference: J Am Coll Radiol. 2015 Feb;12(2): 143-50 6. Extravasated contrast in the right forearm. Aortic Atherosclerosis (ICD10-I70.0). Electronically Signed   By: Ronney Asters M.D.   On: 04/23/2022 19:54   CT Cervical Spine Wo Contrast  Result Date: 04/23/2022 CLINICAL DATA:  Seizure.  Neck trauma. EXAM: CT CERVICAL SPINE WITHOUT CONTRAST TECHNIQUE: Multidetector CT imaging of the cervical spine was performed without intravenous contrast. Multiplanar CT image reconstructions were also generated. RADIATION DOSE REDUCTION: This exam was performed according to the departmental dose-optimization program which includes automated exposure control, adjustment of the mA and/or kV according to patient size and/or use of iterative  reconstruction technique. COMPARISON:  07/20/2021 FINDINGS: Alignment: No malalignment. Skull base and vertebrae: No fracture or focal bone lesion. Soft tissues and spinal canal: No significant soft tissue finding. 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 Other levels negative. Upper chest: Scarring and or atelectasis in the left upper lobe. Other: None IMPRESSION: No acute or traumatic finding. 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 Electronically Signed   By: Nelson Chimes M.D.   On: 04/23/2022 19:50   CT Head Wo Contrast  Result Date: 04/23/2022 CLINICAL DATA:  New onset seizures. EXAM: CT HEAD WITHOUT CONTRAST TECHNIQUE: Contiguous axial images were obtained from the base of the skull through the vertex without intravenous contrast. RADIATION DOSE REDUCTION: This exam was performed according to the departmental dose-optimization program which includes automated exposure control, adjustment of the mA and/or kV according to patient size and/or use of iterative reconstruction technique. COMPARISON:  Jul 20, 2021 FINDINGS: Brain: There is mild cerebral atrophy with widening of the extra-axial spaces and ventricular dilatation. There are areas of decreased attenuation within the white matter tracts of the supratentorial brain, consistent with microvascular disease changes. Vascular: There is marked severity calcification of the bilateral cavernous carotid arteries. Skull: Normal. Negative for fracture or focal lesion. Sinuses/Orbits: Mild ethmoid sinus mucosal thickening is seen. A small chronic  versus postoperative defect is seen involving the medial wall of the left maxillary sinus. Other: None. IMPRESSION: 1. No acute intracranial abnormality. 2. Generalized cerebral atrophy with widening of the extra-axial spaces and ventricular dilatation. Electronically Signed    By: Virgina Norfolk M.D.   On: 04/23/2022 19:48   DG Chest 1 View  Result Date: 04/23/2022 CLINICAL DATA:  Seizure EXAM: CHEST  1 VIEW COMPARISON:  08/19/2021 x-ray and older FINDINGS: Hyperinflation. No consolidation, pneumothorax or effusion. Normal cardiopericardial silhouette without edema. Calcified aorta. IMPRESSION: No acute cardiopulmonary disease Electronically Signed   By: Jill Side M.D.   On: 04/23/2022 15:25     TODAY-DAY OF DISCHARGE:  Subjective:   Danniella Swicegood today has no headache,no chest abdominal pain,no new weakness tingling or numbness, feels much better wants to go home today.   Objective:   Blood pressure 121/65, pulse 76, temperature 98 F (36.7 C), temperature source Axillary, resp. rate 15, height 5' (1.524 m), weight 58.5 kg, SpO2 100 %.  Intake/Output Summary (Last 24 hours) at 04/25/2022 0951 Last data filed at 04/25/2022 0540 Gross per 24 hour  Intake --  Output 401 ml  Net -401 ml   Filed Weights   04/23/22 1950  Weight: 58.5 kg    Exam: Awake Alert, Oriented *3, No new F.N deficits, Normal affect [place].AT,PERRAL Supple Neck,No JVD, No cervical lymphadenopathy appriciated.  Symmetrical Chest wall movement, Good air movement bilaterally, CTAB RRR,No Gallops,Rubs or new Murmurs, No Parasternal Heave +ve B.Sounds, Abd Soft, Non tender, No organomegaly appriciated, No rebound -guarding or rigidity. No Cyanosis, Clubbing or edema, No new Rash or bruise   PERTINENT RADIOLOGIC STUDIES: ECHOCARDIOGRAM COMPLETE  Result Date: 04/24/2022    ECHOCARDIOGRAM REPORT   Patient Name:   ROYA KASCHAK [facility] Date of Exam: 04/24/2022 Medical Rec #:  [id number]     Height:       60.0 in Accession #:    [id number]    Weight:       129.0 lb Date of Birth:  11-12-1940      BSA:          1.549 m Patient Age:    89 years      BP:           143/89 mmHg Patient Gender: F             HR:           78 bpm. Exam Location:  Inpatient Procedure: 2D Echo, Cardiac Doppler and Color  Doppler Indications:    syncope  History:        Patient has prior history of Echocardiogram examinations, most                 recent 08/20/2021. Acsending aortic aneurysm and CHF, Covid.;                 Risk Factors:Hypertension and Dyslipidemia.  Sonographer:    Johny Chess RDCS Referring Phys: [place]  Sonographer Comments: Image acquisition challenging due to uncooperative patient and Image acquisition challenging due to patient behavioral factors. IMPRESSIONS  1. Left ventricular ejection fraction, by estimation, is 70 to 75%. The left ventricle has hyperdynamic function. The left ventricle has no regional wall motion abnormalities. Left ventricular diastolic function could not be evaluated.  2. Right ventricular systolic function is normal. The right ventricular size is normal. There is normal pulmonary artery systolic pressure. The estimated right ventricular systolic pressure is [id number] mmHg.  3. The mitral  valve is normal in structure. Trivial mitral valve regurgitation. No evidence of mitral stenosis.  4. The aortic valve is tricuspid. There is moderate calcification of the aortic valve. There is moderate thickening of the aortic valve. Aortic valve regurgitation is trivial. Aortic valve sclerosis/calcification is present, without any evidence of aortic stenosis. Aortic valve area, by VTI measures 2.23 cm. Aortic valve mean gradient measures 12.0 mmHg. Aortic valve Vmax measures 2.34 m/s.  5. The inferior vena cava is normal in size with greater than 50% respiratory variability, suggesting right atrial pressure of 3 mmHg. FINDINGS  Left Ventricle: Left ventricular ejection fraction, by estimation, is 70 to 75%. The left ventricle has hyperdynamic function. The left ventricle has no regional wall motion abnormalities. The left ventricular internal cavity size was normal in size. There is no left ventricular hypertrophy. Left ventricular diastolic function could not be evaluated. Right  Ventricle: The right ventricular size is normal. No increase in right ventricular wall thickness. Right ventricular systolic function is normal. There is normal pulmonary artery systolic pressure. The tricuspid regurgitant velocity is 1.75 m/s, and  with an assumed right atrial pressure of 3 mmHg, the estimated right ventricular systolic pressure is [id number] mmHg. Left Atrium: Left atrial size was normal in size. Right Atrium: Right atrial size was normal in size. Pericardium: There is no evidence of pericardial effusion. Mitral Valve: The mitral valve is normal in structure. Mild mitral annular calcification. Trivial mitral valve regurgitation. No evidence of mitral valve stenosis. Tricuspid Valve: The tricuspid valve is normal in structure. Tricuspid valve regurgitation is mild . No evidence of tricuspid stenosis. Aortic Valve: The aortic valve is tricuspid. There is moderate calcification of the aortic valve. There is moderate thickening of the aortic valve. Aortic valve regurgitation is trivial. Aortic valve sclerosis/calcification is present, without any evidence of aortic stenosis. Aortic valve mean gradient measures 12.0 mmHg. Aortic valve peak gradient measures 21.9 mmHg. Aortic valve area, by VTI measures 2.23 cm. Pulmonic Valve: The pulmonic valve was normal in structure. Pulmonic valve regurgitation is not visualized. No evidence of pulmonic stenosis. Aorta: The aortic root is normal in size and structure. Venous: The inferior vena cava is normal in size with greater than 50% respiratory variability, suggesting right atrial pressure of 3 mmHg. IAS/Shunts: No atrial level shunt detected by color flow Doppler.  LEFT VENTRICLE PLAX 2D LVIDd:         3.60 cm   Diastology LVIDs:         2.20 cm   LV e' medial:  4.57 cm/s LV PW:         1.00 cm   LV e' lateral: 4.90 cm/s LV IVS:        0.80 cm LVOT diam:     1.90 cm LV SV:         117 LV SV Index:   75 LVOT Area:     2.84 cm  RIGHT VENTRICLE             IVC RV  Basal diam:  2.40 cm     IVC diam: 1.10 cm RV S prime:     10.90 cm/s TAPSE (M-mode): 1.8 cm LEFT ATRIUM             Index        RIGHT ATRIUM          Index LA diam:        3.50 cm 2.26 cm/m   RA Area:     9.31 cm  LA Vol (A2C):   20.5 ml 13.23 ml/m  RA Volume:   17.50 ml 11.30 ml/m LA Vol (A4C):   35.0 ml 22.59 ml/m LA Biplane Vol: 28.0 ml 18.07 ml/m  AORTIC VALVE AV Area (Vmax):    2.48 cm AV Area (Vmean):   2.57 cm AV Area (VTI):     2.23 cm AV Vmax:           234.00 cm/s AV Vmean:          162.000 cm/s AV VTI:            0.523 m AV Peak Grad:      21.9 mmHg AV Mean Grad:      12.0 mmHg LVOT Vmax:         205.00 cm/s LVOT Vmean:        147.000 cm/s LVOT VTI:          0.411 m LVOT/AV VTI ratio: 0.79  AORTA Ao Root diam: 3.30 cm TRICUSPID VALVE TR Peak grad:   12.2 mmHg TR Vmax:        175.00 cm/s  SHUNTS Systemic VTI:  0.41 m Systemic Diam: 1.90 cm Fransico Him MD Electronically signed by Fransico Him MD Signature Date/Time: 04/24/2022/11:01:56 AM    Final    CT L-SPINE NO CHARGE  Result Date: 04/23/2022 CLINICAL DATA:  Seizure.  Back pain. EXAM: CT LUMBAR SPINE WITHOUT CONTRAST TECHNIQUE: Multidetector CT imaging of the lumbar spine was performed without intravenous contrast administration. Multiplanar CT image reconstructions were also generated. RADIATION DOSE REDUCTION: This exam was performed according to the departmental dose-optimization program which includes automated exposure control, adjustment of the mA and/or kV according to patient size and/or use of iterative reconstruction technique. COMPARISON:  Lateral chest radiograph 06/05/2016 FINDINGS: Segmentation: 5 lumbar type vertebral bodies. Alignment: Thoracolumbar curvature convex to the right and lower lumbar curvature convex to the left. Vertebrae: Compression deformity at T12 that I think it is probably old. Loss of height anteriorly of 40%. No retropulsed bone. No other regional bone injury. Paraspinal and other soft tissues: Negative  other than aortic atherosclerosis. Disc levels: No significant disc level pathology at L1-2 or above. L2-3: Endplate osteophytes and bulging of the disc more prominent towards the left. Narrowing of the left lateral recess and intervertebral foramen on the left that could cause left-sided neural compression. L3-4: Bulging of the disc. Mild narrowing of both lateral recesses without definite neural compression. L4-5: Bulging of the disc. Mild narrowing of the lateral recesses without likely neural compression. L5-S1: No disc abnormality.  Mild facet osteoarthritis.  No stenosis IMPRESSION: 1. Probably old compression fracture at T12 with loss of height anteriorly of 40%. No retropulsed bone. 2. L2-3: Endplate osteophytes and bulging of the disc more prominent towards the left. Narrowing of the left lateral recess and intervertebral foramen on the left that could cause left-sided neural compression. 3. L3-4: Bulging of the disc. Mild narrowing of both lateral recesses without likely neural compression. 4. L4-5: Bulging of the disc. Mild narrowing of the lateral recesses without likely neural compression. 5. L5-S1: No disc abnormality. Mild facet osteoarthritis. No stenosis. 6. Aortic atherosclerosis. Aortic Atherosclerosis (ICD10-I70.0). Electronically Signed   By: Nelson Chimes M.D.   On: 04/23/2022 20:52   CT CHEST ABDOMEN PELVIS WO CONTRAST  Result Date: 04/23/2022 CLINICAL DATA:  Seizure.  Abdominal pain. EXAM: CT CHEST, ABDOMEN AND PELVIS WITHOUT CONTRAST TECHNIQUE: Multidetector CT imaging of the chest, abdomen and pelvis was performed following the standard protocol  without IV contrast. RADIATION DOSE REDUCTION: This exam was performed according to the departmental dose-optimization program which includes automated exposure control, adjustment of the mA and/or kV according to patient size and/or use of iterative reconstruction technique. COMPARISON:  None Available. FINDINGS: CT CHEST FINDINGS  Cardiovascular: There is mild aneurysmal dilatation of the ascending aorta measuring 4 cm. Heart is normal in size. There is no pericardial effusion. There are atherosclerotic calcifications of the aorta. Mediastinum/Nodes: There is a hypodense left thyroid nodule measuring 8 mm. Bilateral thyroid calcifications are present. There are no enlarged mediastinal or hilar lymph nodes. There is a small hiatal hernia. The esophagus is within normal limits as visualized. Lungs/Pleura: There is some linear scarring or atelectasis in the left lung apex. Mild emphysematous changes are present. Lungs are otherwise clear. There is no pleural effusion or pneumothorax. Musculoskeletal: There is moderate chronic compression deformity of T12. No acute fractures are seen. CT ABDOMEN PELVIS FINDINGS Hepatobiliary: Small gallstones are present. There is no biliary ductal dilatation. No focal liver lesions are seen. Pancreas: Unremarkable. No pancreatic ductal dilatation or surrounding inflammatory changes. Spleen: Normal in size without focal abnormality. Adrenals/Urinary Tract: There is an air-fluid level in the bladder. Kidneys and adrenal glands are within normal limits. Stomach/Bowel: Stomach is within normal limits. Appendix appears normal. No evidence of bowel wall thickening, distention, or inflammatory changes. Vascular/Lymphatic: Aortic atherosclerosis. No enlarged abdominal or pelvic lymph nodes. Reproductive: Status post hysterectomy. No adnexal masses. Other: No abdominal wall hernia or abnormality. No abdominopelvic ascites. Musculoskeletal: No acute osseous abnormality. Extravasated contrast noted in the right forearm. IMPRESSION: 1. Air-fluid level in the bladder. Correlate clinically for cystitis. 2. No acute localizing process in the chest, abdomen or pelvis. 3. Cholelithiasis. 4. Ascending aortic aneurysm measuring 4 cm. Recommend annual imaging followup by CTA or MRA. This recommendation follows 2010  ACCF/AHA/AATS/ACR/ASA/SCA/SCAI/SIR/STS/SVM Guidelines for the Diagnosis and Management of Patients with Thoracic Aortic Disease. Circulation. 2010; [id number]. Aortic aneurysm NOS (ICD10-I71.9) 5. Subcentimeter incidental left thyroid nodule. No follow-up imaging is recommended. Reference: J Am Coll Radiol. 2015 Feb;12(2): 143-50 6. Extravasated contrast in the right forearm. Aortic Atherosclerosis (ICD10-I70.0). Electronically Signed   By: Ronney Asters M.D.   On: 04/23/2022 19:54   CT Cervical Spine Wo Contrast  Result Date: 04/23/2022 CLINICAL DATA:  Seizure.  Neck trauma. EXAM: CT CERVICAL SPINE WITHOUT CONTRAST TECHNIQUE: Multidetector CT imaging of the cervical spine was performed without intravenous contrast. Multiplanar CT image reconstructions were also generated. RADIATION DOSE REDUCTION: This exam was performed according to the departmental dose-optimization program which includes automated exposure control, adjustment of the mA and/or kV according to patient size and/or use of iterative reconstruction technique. COMPARISON:  07/20/2021 FINDINGS: Alignment: No malalignment. Skull base and vertebrae: No fracture or focal bone lesion. Soft tissues and spinal canal: No significant soft tissue finding. 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 Other levels negative. Upper chest: Scarring and or atelectasis in the left upper lobe. Other: None IMPRESSION: No acute or traumatic finding. 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 Electronically Signed   By: Nelson Chimes M.D.   On: 04/23/2022 19:50   CT Head Wo Contrast  Result Date: 04/23/2022 CLINICAL DATA:  New onset seizures. EXAM: CT HEAD WITHOUT CONTRAST TECHNIQUE: Contiguous axial images were obtained from the base of the skull through the vertex without intravenous contrast. RADIATION DOSE REDUCTION:  This exam was performed according to the departmental dose-optimization program which includes automated exposure control, adjustment of the mA and/or kV according to patient size and/or use of iterative reconstruction technique. COMPARISON:  Jul 20, 2021 FINDINGS: Brain: There is mild cerebral atrophy with widening of the extra-axial spaces and ventricular dilatation. There are areas of decreased attenuation within the white matter tracts of the supratentorial brain, consistent with microvascular disease changes. Vascular: There is marked severity calcification of the bilateral cavernous carotid arteries. Skull: Normal. Negative for fracture or focal lesion. Sinuses/Orbits: Mild ethmoid sinus mucosal thickening is seen. A small chronic versus postoperative defect is seen involving the medial wall of the left maxillary sinus. Other: None. IMPRESSION: 1. No acute intracranial abnormality. 2. Generalized cerebral atrophy with widening of the extra-axial spaces and ventricular dilatation. Electronically Signed   By: Virgina Norfolk M.D.   On: 04/23/2022 19:48   DG Chest 1 View  Result Date: 04/23/2022 CLINICAL DATA:  Seizure EXAM: CHEST  1 VIEW COMPARISON:  08/19/2021 x-ray and older FINDINGS: Hyperinflation. No consolidation, pneumothorax or effusion. Normal cardiopericardial silhouette without edema. Calcified aorta. IMPRESSION: No acute cardiopulmonary disease Electronically Signed   By: Jill Side M.D.   On: 04/23/2022 15:25     PERTINENT LAB RESULTS: CBC: Recent Labs    04/23/22 1424 04/24/22 0440  WBC 7.4 6.7  HGB 12.2 11.5*  HCT 35.3* 32.4*  PLT 203 191   CMET CMP     Component Value Date/Time   NA 137 04/24/2022 0440   NA 141 11/19/2018 0952   K 3.5 04/24/2022 0440   CL 105 04/24/2022 0440   CO2 19 (L) 04/24/2022 0440   GLUCOSE 110 (H) 04/24/2022 0440   BUN 16 04/24/2022 0440   BUN 20 11/19/2018 0952   CREATININE 1.21 (H) 04/24/2022 0440   CREATININE 0.85 01/28/2014 1158    CALCIUM 8.9 04/24/2022 0440   PROT 6.0 (L) 04/24/2022 0440   PROT 7.2 02/13/2017 0905   ALBUMIN 3.1 (L) 04/24/2022 0440   ALBUMIN 4.2 02/13/2017 0905   AST 27 04/24/2022 0440   ALT 13 04/24/2022 0440   ALKPHOS 48 04/24/2022 0440   BILITOT 0.7 04/24/2022 0440   BILITOT 0.4 02/13/2017 0905   GFRNONAA 45 (L) 04/24/2022 0440   GFRNONAA 68 01/28/2014 1158   GFRAA 59 (L) 11/19/2018 0952   GFRAA 79 01/28/2014 1158    GFR Estimated Creatinine Clearance: 29.2 mL/min (A) (by C-G formula based on SCr of 1.21 mg/dL (H)). No results for input(s): "LIPASE", "AMYLASE" in the last 72 hours. Recent Labs    04/23/22 1459  CKTOTAL 112   Invalid input(s): "POCBNP" No results for input(s): "DDIMER" in the last 72 hours. No results for input(s): "HGBA1C" in the last 72 hours. No results for input(s): "CHOL", "HDL", "LDLCALC", "TRIG", "CHOLHDL", "LDLDIRECT" in the last 72 hours. No results for input(s): "TSH", "T4TOTAL", "T3FREE", "THYROIDAB" in the last 72 hours.  Invalid input(s): "FREET3" No results for input(s): "VITAMINB12", "FOLATE", "FERRITIN", "TIBC", "IRON", "RETICCTPCT" in the last 72 hours. Coags: No results for input(s): "INR" in the last 72 hours.  Invalid input(s): "PT" Microbiology: Recent Results (from the past 240 hour(s))  Resp panel by RT-PCR (RSV, Flu A&B, Covid) Anterior Nasal Swab     Status: Abnormal   Collection Time: 04/23/22  3:48 PM   Specimen: Anterior Nasal Swab  Result Value Ref Range Status   SARS Coronavirus 2 by RT PCR POSITIVE (A) NEGATIVE Final   Influenza A by PCR NEGATIVE NEGATIVE  Final   Influenza B by PCR NEGATIVE NEGATIVE Final    Comment: (NOTE) The Xpert Xpress SARS-CoV-2/FLU/RSV plus assay is intended as an aid in the diagnosis of influenza from Nasopharyngeal swab specimens and should not be used as a sole basis for treatment. Nasal washings and aspirates are unacceptable for Xpert Xpress SARS-CoV-2/FLU/RSV testing.  Fact Sheet for  Patients: EntrepreneurPulse.com.au  Fact Sheet for Healthcare Providers: IncredibleEmployment.be  This test is not yet approved or cleared by the [place] FDA and has been authorized for detection and/or diagnosis of SARS-CoV-2 by FDA under an Emergency Use Authorization (EUA). This EUA will remain in effect (meaning this test can be used) for the duration of the COVID-19 declaration under Section 564(b)(1) of the Act, 21 U.S.C. section 360bbb-3(b)(1), unless the authorization is terminated or revoked.     Resp Syncytial Virus by PCR NEGATIVE NEGATIVE Final    Comment: (NOTE) Fact Sheet for Patients: EntrepreneurPulse.com.au  Fact Sheet for Healthcare Providers: IncredibleEmployment.be  This test is not yet approved or cleared by the [place] FDA and has been authorized for detection and/or diagnosis of SARS-CoV-2 by FDA under an Emergency Use Authorization (EUA). This EUA will remain in effect (meaning this test can be used) for the duration of the COVID-19 declaration under Section 564(b)(1) of the Act, 21 U.S.C. section 360bbb-3(b)(1), unless the authorization is terminated or revoked.  Performed at [facility], [place] [address], [place], [place] [address]     FURTHER DISCHARGE INSTRUCTIONS:  Get Medicines reviewed and adjusted: Please take all your medications with you for your next visit with your Primary MD  Laboratory/radiological data: Please request your Primary MD to go over all hospital tests and procedure/radiological results at the follow up, please ask your Primary MD to get all Hospital records sent to his/her office.  In some cases, they will be blood work, cultures and biopsy results pending at the time of your discharge. Please request that your primary care M.D. goes through all the records of your hospital data and follows up on these results.  Also Note the  following: If you experience worsening of your admission symptoms, develop shortness of breath, life threatening emergency, suicidal or homicidal thoughts you must seek medical attention immediately by calling 911 or calling your MD immediately  if symptoms less severe.  You must read complete instructions/literature along with all the possible adverse reactions/side effects for all the Medicines you take and that have been prescribed to you. Take any new Medicines after you have completely understood and accpet all the possible adverse reactions/side effects.   Do not drive when taking Pain medications or sleeping medications (Benzodaizepines)  Do not take more than prescribed Pain, Sleep and Anxiety Medications. It is not advisable to combine anxiety,sleep and pain medications without talking with your primary care practitioner  Special Instructions: If you have smoked or chewed Tobacco  in the last 2 yrs please stop smoking, stop any regular Alcohol  and or any Recreational drug use.  Wear Seat belts while driving.  Please note: You were cared for by a hospitalist during your hospital stay. Once you are discharged, your primary care physician will handle any further medical issues. Please note that NO REFILLS for any discharge medications will be authorized once you are discharged, as it is imperative that you return to your primary care physician (or establish a relationship with a primary care physician if you do not have one) for your post hospital discharge needs so that  they can reassess your need for medications and monitor your lab values.  Total Time spent coordinating discharge including counseling, education and face to face time equals greater than 30 minutes.  SignedOren Binet 04/25/2022 9:51 AM

## 2022-04-25 NOTE — Evaluation (Signed)
Occupational Therapy Evaluation Patient Details Name: Kara Dennis MRN: [id number] DOB: [birth year] Today's Date: 04/25/2022   History of Present Illness 82 y.o. female who presented to [facility] on 2/13 with generalized weakness, cough, syncope with coughing spells, found to have COVID-19. Imaging reveals old T12 compression fracture. PMHx significant of dementia, HTN, cervical cancer s/p hysterectomy, HFpEF   Clinical Impression   PTA, pt's family rotates to provide 24/7 assist. Due to baseline cognitive deficits, pt requires extensive assist for ADLs, handheld assist for transfers to/from wheelchair and assist for w/c propulsion. Pt presents now close to reported baseline with no consistent following of commands. Pt requires Max A x 2 for bed mobility and Mod-Max A x 2 for brief standing at bedside w/ inability to take steps. Pt's daughter present; extended time spent discussing DME needs at home, ADL assist options bed level and consideration of hoyer lift for transfers on "bad days". If pt to remain admitted, will follow for one additional session to reinforce ADL assist bed level, lift pad placement/use and use of hospital bed for caregiver body mechanics. No OT follow up needed at DC as pt unable to effectively participate to progress functional skills.       Recommendations for follow up therapy are one component of a multi-disciplinary discharge planning process, led by the attending physician.  Recommendations may be updated based on patient status, additional functional criteria and insurance authorization.   Follow Up Recommendations  No OT follow up     Assistance Recommended at Discharge Frequent or constant Supervision/Assistance  Patient can return home with the following Two people to help with walking and/or transfers;Two people to help with bathing/dressing/bathroom;Assistance with feeding    Functional Status Assessment  Patient has not had a recent decline in their functional  status  Equipment Recommendations  Hospital bed;Other (comment) (consider hoyer lift)    Recommendations for Other Services       Precautions / Restrictions Precautions Precautions: Fall Restrictions Weight Bearing Restrictions: No      Mobility Bed Mobility Overal bed mobility: Needs Assistance Bed Mobility: Supine to Sit, Sit to Supine     Supine to sit: Max assist, +2 for physical assistance, +2 for safety/equipment Sit to supine: +2 for physical assistance, +2 for safety/equipment, Total assist   General bed mobility comments: assist for all aspects, some initiation to lift trunk to EOB. Poor initiation to return to supine and required Total A x 2    Transfers Overall transfer level: Needs assistance Equipment used: 2 person hand held assist Transfers: Sit to/from Stand Sit to Stand: Max assist, +2 physical assistance, +2 safety/equipment, Mod assist           General transfer comment: inconsistent following of commands and initiation, various brief sit to stands at bedside though did not achieve full standing posture. unable to sequence taking steps      Balance Overall balance assessment: Needs assistance Sitting-balance support: No upper extremity supported, Feet supported Sitting balance-Leahy Scale: Fair     Standing balance support: Bilateral upper extremity supported, During functional activity Standing balance-Leahy Scale: Poor                             ADL either performed or assessed with clinical judgement   ADL Overall ADL's : Needs assistance/impaired;At baseline  General ADL Comments: Extensive assist for ADLs at baseline, does not follow commands consistently to assist with tasks.     Vision Ability to See in Adequate Light: 0 Adequate Patient Visual Report: No change from baseline Vision Assessment?: No apparent visual deficits     Perception     Praxis       Pertinent Vitals/Pain Pain Assessment Pain Assessment: Faces Faces Pain Scale: No hurt Pain Intervention(s): Monitored during session     Hand Dominance  (unsure)   Extremity/Trunk Assessment Upper Extremity Assessment Upper Extremity Assessment: Difficult to assess due to impaired cognition;Overall [facility] for tasks assessed   Lower Extremity Assessment Lower Extremity Assessment: Defer to PT evaluation   Cervical / Trunk Assessment Cervical / Trunk Assessment: Normal   Communication Communication Communication: Prefers language other than English;Expressive difficulties (nonverbal)   Cognition Arousal/Alertness: Awake/alert Behavior During Therapy: Restless, Impulsive, Flat affect Overall Cognitive Status: History of cognitive impairments - at baseline                                 General Comments: hx of dementia; does not follow commands consistently but occasionally would respond to tactile cues to initiate tasks. pulling at therapist's contact gown, arms and spitting on floor     General Comments  Daughter at bedside, time spent discussing DME needs and ADL/transfer assist for good and bad days. Inquiring if ok with returning home vs facility placement though daughter reports need to talk to brother to discuss that    Exercises     Shoulder Instructions      Home Living Family/patient expects to be discharged to:: Private residence Living Arrangements: Children Available Help at Discharge: Family;Available 24 hours/day Type of Home: House Home Access: Stairs to enter [employer] of Steps: 4 Entrance Stairs-Rails: Right;Left Home Layout: One level     Bathroom Shower/Tub: Occupational psychologist: Standard     Home Equipment: Grab bars - tub/shower;Rolling Environmental consultant (2 wheels);Rollator (4 wheels);BSC/3in1;Shower seat;Wheelchair - Education officer, museum Comments: home setup is pt's son's home which is where pt will  DC to. Daughter, son and sister in law rotate to provide consistent assist/support      Prior Functioning/Environment Prior Level of Function : Needs assist  Cognitive Assist : Mobility (cognitive);ADLs (cognitive) Mobility (Cognitive): Step by step cues ADLs (Cognitive): Step by step cues Physical Assist : ADLs (physical);Mobility (physical) Mobility (physical): Transfers;Gait;Stairs;Bed mobility ADLs (physical): Bathing;Dressing;Toileting;Grooming Mobility Comments: handheld assist to pivot to w/c, requires assist for w/c propulsion ADLs Comments: Daughter does all ADLs for pt, assists into bathroom (does not use BSC or briefs), uses shower chair with assist        OT Problem List: Decreased cognition;Impaired balance (sitting and/or standing);Decreased activity tolerance      OT Treatment/Interventions: Self-care/ADL training;Therapeutic exercise;DME and/or AE instruction;Therapeutic activities;Patient/family education    OT Goals(Current goals can be found in the care plan section) Acute Rehab OT Goals Patient Stated Goal: be able to care for pt; hospital bed at home OT Goal Formulation: With family Time For Goal Achievement: 05/09/22 Potential to Achieve Goals: Fair  OT Frequency: Min 2X/week    Co-evaluation              AM-PAC OT "6 Clicks" Daily Activity     Outcome Measure Help from another person eating meals?: A Lot Help from another person taking care of personal grooming?: A Lot Help from  another person toileting, which includes using toliet, bedpan, or urinal?: Total Help from another person bathing (including washing, rinsing, drying)?: Total Help from another person to put on and taking off regular upper body clothing?: Total Help from another person to put on and taking off regular lower body clothing?: Total 6 Click Score: 8   End of Session Nurse Communication: Mobility status  Activity Tolerance: Other (comment) (limited by cognition) Patient left:  in bed;with call bell/phone within reach;with bed alarm set;with family/visitor present  OT Visit Diagnosis: Unsteadiness on feet (R26.81);Other symptoms and signs involving cognitive function                Time: [id number] OT Time Calculation (min): 29 min Charges:  OT Evaluation $OT Eval Moderate Complexity: 1 Mod  Malachy Chamber, OTR/L Acute Rehab Services Office: [phone]   Layla Maw 04/25/2022, 10:32 AM

## 2022-04-25 NOTE — TOC Initial Note (Signed)
Transition of Care [facility]) - Initial/Assessment Note    Patient Details  Name: Kara Dennis MRN: [id number] Date of Birth: November 13, 1940  Transition of Care [facility]) CM/SW Contact:    Levonne Lapping, RN Phone Number: 04/25/2022, 10:26 AM  Clinical Narrative:    CM met with Patient and Daughter in room.   Daughter "Kara Dennis" states that Patient lives with her on the weekends at the [employer] address listed in chart.[phone]   During the week,patient stays with her Son, "Kara Dennis" [address] [place], [place]  [address]  His number is [phone]    Daughter is requesting a [facility] and has no preference of provider- Celesta Aver has been contacted   PT/OT recs are pending          Expected Discharge Plan: [place] Barriers to Discharge: Continued Medical Work up   Patient Goals and CMS Choice Patient states their goals for this hospitalization and ongoing recovery are:: [place] states- To go home          Expected Discharge Plan and Services In-house Referral: Interpreting Services Discharge Planning Services: CM Consult Post Acute Care Choice: Durable Medical Equipment, Home Health Living arrangements for the past 2 months: [place] (With Son, "Kara Dennis" during the week  [place] [id number] ; With Daughter "Kara Dennis" on weekends [place] [id number]) Expected Discharge Date: 04/25/22               DME Arranged: Bedside commode DME Agency: [employer] Date DME Agency Contacted: 04/25/22 Time DME Agency Contacted: [id number] Representative spoke with at DME Agency: Brenton Grills            Prior Living Arrangements/Services Living arrangements for the past 2 months: [place] (With Son, "Kara Dennis" during the week  [place] [id number] ; With Daughter "Kara Dennis" on weekends [place] [id number]) Lives with:: Adult Children (With Son, "Kara Dennis"  During the week [address]  [place], [place]  [address] ; With Daughter on weekends)              Current home services:  (NA)    Activities of Daily Living      Permission Sought/Granted                  Emotional Assessment              Admission diagnosis:  Dehydration [E86.0] Failure to thrive in adult [R62.7] Extravasation of intravenous contrast medium [T80.818A] Acute cough [R05.1] COVID-19 virus infection [U07.1] COVID-19 [U07.1] Patient Active Problem List   Diagnosis Date Noted   Cough 04/24/2022   Ascending aortic aneurysm ([place]) 04/24/2022   Chronic diastolic CHF (congestive heart failure) ([place]) 04/24/2022   COVID-19 virus infection 04/23/2022   Dehydration 08/20/2021   Elevated troponin 08/19/2021   Hyponatremia 08/19/2021   Syncope 07/20/2021   Acute kidney injury superimposed on chronic kidney disease ([place]) 07/20/2021   Thyroid nodule 07/20/2021   Syncope due to orthostatic hypotension 12/15/2017   Prediabetes 06/30/2016   Neck mass 04/17/2016   High cholesterol 01/31/2014   History of cervical cancer 01/28/2014   Dementia ([place]) 01/28/2014   Essential hypertension    PCP:  [place]:   [place] [address], [place] [address] Phone: [phone] Fax: [phone]  [id number] [place] Phone: [phone] Fax: [phone]     Social Determinants of Health (SDOH) Social History: SDOH Screenings   Depression (PHQ2-9): Low Risk  (01/05/2019)  Tobacco Use: Low Risk  (04/24/2022)   SDOH Interventions:     Readmission Risk Interventions     No data to display

## 2022-04-25 NOTE — Evaluation (Signed)
Physical Therapy Evaluation Patient Details Name: Kara Dennis MRN: [id number] DOB: 07/27/1940 Today's Date: 04/25/2022  History of Present Illness  82 y.o. female who presented to [facility] on 2/13 with generalized weakness, cough, syncope with coughing spells, found to have COVID-19. Imaging reveals old T12 compression fracture. PMHx significant of dementia, HTN, cervical cancer s/p hysterectomy, HFpEF  Clinical Impression  Pt presents today with impaired mobility limited by baseline cognitive deficits. Pt's daughter present throughout session providing history and baseline mobility for pt through use of interpreter. Pt requires assist with all mobility, occasionally following commands with increased time but performs stand pivot transfers to w/c and requires someone to propel wheelchair in the home for the pt. Pt appears to be at her baseline mobility and with limited command following and baseline cognitive deficits, pt with limited potential for therapy. DME and home needs discussed with pt's daughter, recommending hospital bed and hoyer lift at discharge. Acute PT will follow pt during admission for family training for equipment but recommend discharge home with DME and continued family support, no further PT needs at that time.      Recommendations for follow up therapy are one component of a multi-disciplinary discharge planning process, led by the attending physician.  Recommendations may be updated based on patient status, additional functional criteria and insurance authorization.  Follow Up Recommendations No PT follow up      Assistance Recommended at Discharge Frequent or constant Supervision/Assistance  Patient can return home with the following  A lot of help with walking and/or transfers;A lot of help with bathing/dressing/bathroom;Assistance with cooking/housework;Direct supervision/assist for medications management;Assist for transportation;Help with stairs or ramp for entrance     Equipment Recommendations Hospital bed;Other (comment) (hoyer lift)  Recommendations for Other Services       Functional Status Assessment Patient has had a recent decline in their functional status and/or demonstrates limited ability to make significant improvements in function in a reasonable and predictable amount of time     Precautions / Restrictions Precautions Precautions: Fall Restrictions Weight Bearing Restrictions: No      Mobility  Bed Mobility Overal bed mobility: Needs Assistance Bed Mobility: Supine to Sit, Sit to Supine     Supine to sit: Max assist, +2 for physical assistance, +2 for safety/equipment, HOB elevated Sit to supine: +2 for physical assistance, +2 for safety/equipment, Total assist, HOB elevated   General bed mobility comments: maxAx2 for supine>sit with mild initiation from pt to lift trunk, totalAx2 back to supine, no initiation noted    Transfers Overall transfer level: Needs assistance Equipment used: 2 person hand held assist Transfers: Sit to/from Stand Sit to Stand: Max assist, +2 physical assistance, +2 safety/equipment, Mod assist           General transfer comment: varying from modAx2 to maxAx2 to support standing, pt intermittently initiating but unable to come to complete stand with assistance, inconsistently following commands    Ambulation/Gait               General Gait Details: unable to perform, utilizes w/c at baseline  Stairs            Wheelchair Mobility    Modified Rankin (Stroke Patients Only)       Balance Overall balance assessment: Needs assistance Sitting-balance support: No upper extremity supported, Feet supported Sitting balance-Leahy Scale: Fair Sitting balance - Comments: stable with close guard while seated EOB due to impulsivity   Standing balance support: Bilateral upper extremity supported, During functional activity  Standing balance-Leahy Scale: Poor Standing balance comment:  requires assist with brief attempts at standing, maintaining <5 seconds                             Pertinent Vitals/Pain Pain Assessment Pain Assessment: Faces Faces Pain Scale: No hurt    Home Living Family/patient expects to be discharged to:: Private residence Living Arrangements: Children Available Help at Discharge: Family;Available 24 hours/day Type of Home: House Home Access: Stairs to enter Entrance Stairs-Rails: Psychiatric nurse of Steps: 4   Home Layout: One level Home Equipment: Grab bars - tub/shower;Rolling Walker (2 wheels);Rollator (4 wheels);BSC/3in1;Shower seat;Wheelchair - Dentist Comments: Pt rotates between son's home and daughter's home but home setup is based off of pt's son's home which is where pt will DC to.    Prior Function Prior Level of Function : Needs assist  Cognitive Assist : Mobility (cognitive) Mobility (Cognitive): Step by step cues ADLs (Cognitive): Step by step cues Physical Assist : Mobility (physical) Mobility (physical): Transfers;Gait;Stairs;Bed mobility ADLs (physical): Bathing;Dressing;Toileting;Grooming Mobility Comments: assist with all mobility, transfers to w/c and unable to propel herself, assist with stairs ADLs Comments: Daughter does all ADLs for pt, assists into bathroom (does not use BSC or briefs), uses shower chair with assist     Hand Dominance   Dominant Hand:  (unsure)    Extremity/Trunk Assessment   Upper Extremity Assessment Upper Extremity Assessment: Defer to OT evaluation    Lower Extremity Assessment Lower Extremity Assessment: Difficult to assess due to impaired cognition (rests with B knee and hip flexion, minimally stands but knees remain flexed, active movement noted in BLE)    Cervical / Trunk Assessment Cervical / Trunk Assessment: Normal  Communication   Communication: Prefers language other than English;Expressive difficulties;Interpreter  utilized (nonverbal, pt's daughter speaks some English, virtual interpreter utilizied)  Cognition Arousal/Alertness: Awake/alert Behavior During Therapy: Restless, Impulsive, Flat affect Overall Cognitive Status: History of cognitive impairments - at baseline                                 General Comments: pt with history of dementia, per daughter's report pt doesn't follow commands consistently at baseline, mobilizes with increased time and occasionally responding to visual and tactile cues        General Comments General comments (skin integrity, edema, etc.): VSS on room air, daughter present throughout session, interpreter utilized    Exercises     Assessment/Plan    PT Assessment Patient needs continued PT services  PT Problem List Decreased activity tolerance;Decreased balance;Decreased cognition;Decreased safety awareness;Decreased knowledge of precautions       PT Treatment Interventions DME instruction;Functional mobility training;Patient/family education    PT Goals (Current goals can be found in the Care Plan section)  Acute Rehab PT Goals Patient Stated Goal: get equipment to assist in care PT Goal Formulation: With family Time For Goal Achievement: 05/09/22 Potential to Achieve Goals: Fair    Frequency Min 1X/week     Co-evaluation PT/OT/SLP Co-Evaluation/Treatment: Yes Reason for Co-Treatment: Complexity of the patient's impairments (multi-system involvement) PT goals addressed during session: Mobility/safety with mobility         AM-PAC PT "6 Clicks" Mobility  Outcome Measure Help needed turning from your back to your side while in a flat bed without using bedrails?: Total Help needed moving from lying on your back to sitting on the  side of a flat bed without using bedrails?: Total Help needed moving to and from a bed to a chair (including a wheelchair)?: Total Help needed standing up from a chair using your arms (e.g., wheelchair or  bedside chair)?: Total Help needed to walk in hospital room?: Total Help needed climbing 3-5 steps with a railing? : Total 6 Click Score: 6    End of Session   Activity Tolerance: Patient tolerated treatment well;Other (comment) (limited by cognition) Patient left: in bed;with call bell/phone within reach;with bed alarm set;with family/visitor present Nurse Communication: Mobility status PT Visit Diagnosis: Difficulty in walking, not elsewhere classified (R26.2)    Time: [id number] PT Time Calculation (min) (ACUTE ONLY): 24 min   Charges:   PT Evaluation $PT Eval Moderate Complexity: 1 Mod          Charlynne Cousins, PT DPT Acute Rehabilitation Services Office [phone]   Luvenia Heller 04/25/2022, 11:55 AM

## 2023-02-09 DEATH — deceased
